# Patient Record
Sex: Female | Born: 1945 | Race: White | Hispanic: No | Smoking: Former smoker
Health system: Southern US, Community
[De-identification: ages and names within clinical notes are randomized; demographics above are authoritative.]

## PROBLEM LIST (undated history)

## (undated) DIAGNOSIS — E785 Hyperlipidemia, unspecified: Secondary | ICD-10-CM

## (undated) DIAGNOSIS — Z0000 Encounter for general adult medical examination without abnormal findings: Secondary | ICD-10-CM

## (undated) DIAGNOSIS — F4321 Adjustment disorder with depressed mood: Secondary | ICD-10-CM

## (undated) DIAGNOSIS — M1990 Unspecified osteoarthritis, unspecified site: Secondary | ICD-10-CM

## (undated) DIAGNOSIS — I10 Essential (primary) hypertension: Secondary | ICD-10-CM

## (undated) DIAGNOSIS — M7500 Adhesive capsulitis of unspecified shoulder: Secondary | ICD-10-CM

## (undated) DIAGNOSIS — M75101 Unspecified rotator cuff tear or rupture of right shoulder, not specified as traumatic: Secondary | ICD-10-CM

## (undated) DIAGNOSIS — J209 Acute bronchitis, unspecified: Secondary | ICD-10-CM

## (undated) DIAGNOSIS — L719 Rosacea, unspecified: Secondary | ICD-10-CM

## (undated) DIAGNOSIS — M8580 Other specified disorders of bone density and structure, unspecified site: Secondary | ICD-10-CM

## (undated) DIAGNOSIS — N6459 Other signs and symptoms in breast: Secondary | ICD-10-CM

## (undated) DIAGNOSIS — E7800 Pure hypercholesterolemia, unspecified: Secondary | ICD-10-CM

## (undated) HISTORY — PX: SHOULDER ARTHROSCOPY: SHX128

## (undated) HISTORY — DX: Pure hypercholesterolemia, unspecified: E78.00

## (undated) HISTORY — DX: Encounter for general adult medical examination without abnormal findings: Z00.00

## (undated) HISTORY — DX: Unspecified osteoarthritis, unspecified site: M19.90

## (undated) HISTORY — DX: Acute bronchitis, unspecified: J20.9

## (undated) HISTORY — DX: Essential (primary) hypertension: I10

## (undated) HISTORY — DX: Unspecified rotator cuff tear or rupture of right shoulder, not specified as traumatic: M75.101

## (undated) HISTORY — DX: Rosacea, unspecified: L71.9

## (undated) HISTORY — DX: Other specified disorders of bone density and structure, unspecified site: M85.80

## (undated) HISTORY — PX: OOPHORECTOMY: SHX86

## (undated) HISTORY — DX: Adhesive capsulitis of unspecified shoulder: M75.00

## (undated) HISTORY — DX: Hyperlipidemia, unspecified: E78.5

## (undated) HISTORY — DX: Adjustment disorder with depressed mood: F43.21

---

## 1997-09-10 ENCOUNTER — Other Ambulatory Visit: Admission: RE | Admit: 1997-09-10 | Discharge: 1997-09-10 | Payer: Self-pay | Admitting: Obstetrics and Gynecology

## 1997-10-15 ENCOUNTER — Ambulatory Visit (HOSPITAL_COMMUNITY): Admission: RE | Admit: 1997-10-15 | Discharge: 1997-10-15 | Payer: Self-pay | Admitting: Obstetrics and Gynecology

## 1998-07-06 ENCOUNTER — Other Ambulatory Visit: Admission: RE | Admit: 1998-07-06 | Discharge: 1998-07-06 | Payer: Self-pay | Admitting: Obstetrics and Gynecology

## 1998-11-08 ENCOUNTER — Other Ambulatory Visit: Admission: RE | Admit: 1998-11-08 | Discharge: 1998-11-08 | Payer: Self-pay | Admitting: Obstetrics and Gynecology

## 1999-05-09 ENCOUNTER — Other Ambulatory Visit: Admission: RE | Admit: 1999-05-09 | Discharge: 1999-05-09 | Payer: Self-pay | Admitting: Obstetrics and Gynecology

## 1999-11-10 ENCOUNTER — Other Ambulatory Visit: Admission: RE | Admit: 1999-11-10 | Discharge: 1999-11-10 | Payer: Self-pay | Admitting: Obstetrics and Gynecology

## 2000-11-01 ENCOUNTER — Other Ambulatory Visit: Admission: RE | Admit: 2000-11-01 | Discharge: 2000-11-01 | Payer: Self-pay | Admitting: Obstetrics and Gynecology

## 2002-02-03 ENCOUNTER — Other Ambulatory Visit: Admission: RE | Admit: 2002-02-03 | Discharge: 2002-02-03 | Payer: Self-pay | Admitting: Obstetrics and Gynecology

## 2003-03-13 ENCOUNTER — Other Ambulatory Visit: Admission: RE | Admit: 2003-03-13 | Discharge: 2003-03-13 | Payer: Self-pay | Admitting: Obstetrics and Gynecology

## 2003-04-23 ENCOUNTER — Encounter (INDEPENDENT_AMBULATORY_CARE_PROVIDER_SITE_OTHER): Payer: Self-pay | Admitting: Specialist

## 2003-04-23 ENCOUNTER — Ambulatory Visit (HOSPITAL_COMMUNITY): Admission: RE | Admit: 2003-04-23 | Discharge: 2003-04-23 | Payer: Self-pay | Admitting: *Deleted

## 2004-05-03 ENCOUNTER — Other Ambulatory Visit: Admission: RE | Admit: 2004-05-03 | Discharge: 2004-05-03 | Payer: Self-pay | Admitting: Obstetrics and Gynecology

## 2005-05-31 ENCOUNTER — Encounter: Payer: Self-pay | Admitting: Obstetrics and Gynecology

## 2005-06-28 ENCOUNTER — Other Ambulatory Visit: Admission: RE | Admit: 2005-06-28 | Discharge: 2005-06-28 | Payer: Self-pay | Admitting: Obstetrics & Gynecology

## 2006-07-17 ENCOUNTER — Other Ambulatory Visit: Admission: RE | Admit: 2006-07-17 | Discharge: 2006-07-17 | Payer: Self-pay | Admitting: Obstetrics and Gynecology

## 2007-08-27 ENCOUNTER — Other Ambulatory Visit: Admission: RE | Admit: 2007-08-27 | Discharge: 2007-08-27 | Payer: Self-pay | Admitting: Obstetrics and Gynecology

## 2008-05-02 ENCOUNTER — Ambulatory Visit: Payer: Self-pay | Admitting: Diagnostic Radiology

## 2008-05-02 ENCOUNTER — Emergency Department (HOSPITAL_BASED_OUTPATIENT_CLINIC_OR_DEPARTMENT_OTHER): Admission: EM | Admit: 2008-05-02 | Discharge: 2008-05-02 | Payer: Self-pay | Admitting: Emergency Medicine

## 2010-06-17 NOTE — Op Note (Signed)
Robin [name], MADOLE                            ACCOUNT NO.:  [id number]   MEDICAL RECORD NO.:  [id number]                   PATIENT TYPE:  AMB   LOCATION:  ENDO                                 FACILITY:  [facility]   PHYSICIAN:  Georgiana Spinner, M.D.                 DATE OF BIRTH:  [birth year]   DATE OF PROCEDURE:  04/23/2003  DATE OF DISCHARGE:                                 OPERATIVE REPORT   PROCEDURE:  Colonoscopy.   INDICATIONS FOR PROCEDURE:  Colon cancer screening.   ANESTHESIA:  Demerol 20, Versed 2 mg.   PROCEDURE:  With the patient mildly sedated in the left lateral decubitus  position, the Olympus videoscopic colonoscope was inserted in the rectum and  passed under direct vision to the cecum identified by the ileocecal valve  and appendiceal orifice, both of which were photographed.  From this point,  the colonoscope was slowly withdrawn taking circumferential views of the  entire colonic mucosa stopping only in the rectum which appeared normal on  direct and retroflex view.  The endoscope was straightened withdrawn.  The  patient's vital signs and pulse oximeter remained stable.  The patient  tolerated the procedure well without apparent complications.   FINDINGS:  Unremarkable examination.   PLAN:  See endoscopy note for further details.                                               Georgiana Spinner, M.D.    GMO/MEDQ  D:  04/23/2003  T:  04/23/2003  Job:  [id number]

## 2010-06-17 NOTE — Op Note (Signed)
Robin [name], Robin [name]                            ACCOUNT NO.:  [id number]   MEDICAL RECORD NO.:  [id number]                   PATIENT TYPE:  AMB   LOCATION:  ENDO                                 FACILITY:  [facility]   PHYSICIAN:  Georgiana Spinner, M.D.                 DATE OF BIRTH:  1945-11-15   DATE OF PROCEDURE:  04/23/2003  DATE OF DISCHARGE:                                 OPERATIVE REPORT   PROCEDURE:  Upper endoscopy.   INDICATIONS FOR PROCEDURE:  GERD.   ANESTHESIA:  Demerol 50, Versed 5 mg.   PROCEDURE:  With the patient mildly sedated in the left lateral decubitus  position, the Olympus videoscopic endoscope was inserted in the mouth and  passed under direct vision through the esophagus which appeared normal  except the distal esophagus showed possible Barrett's, although this may  have been a normal variant, we photographed and biopsied this area.  We  entered into the stomach.  The fundus, body, antrum, duodenal bulb, and  second portion of the duodenum were visualized.  From this point, the  endoscope was slowly withdrawn taking circumferential views of the duodenal  mucosa until the endoscope was pulled back into the stomach and placed in  retroflexion to view the stomach from below.  The endoscope was then  straightened and withdrawn taking circumferential views of the remaining  gastric and esophageal mucosa.  The patient's vital signs and pulse oximeter  remained stable.  The patient tolerated the procedure well without apparent  complications.   FINDINGS:  Unremarkable examination other than distal esophagus which may be  a normal variant.  Await biopsy results.  The patient will call me for  results and follow up with me as an outpatient.  Proceed to colonoscopy.                                               Georgiana Spinner, M.D.    GMO/MEDQ  D:  04/23/2003  T:  04/23/2003  Job:  [id number]

## 2011-10-31 DIAGNOSIS — M7500 Adhesive capsulitis of unspecified shoulder: Secondary | ICD-10-CM

## 2011-10-31 HISTORY — DX: Adhesive capsulitis of unspecified shoulder: M75.00

## 2011-11-24 ENCOUNTER — Ambulatory Visit: Payer: Medicare Other | Attending: Orthopaedic Surgery | Admitting: Rehabilitation

## 2011-11-24 DIAGNOSIS — M25619 Stiffness of unspecified shoulder, not elsewhere classified: Secondary | ICD-10-CM | POA: Insufficient documentation

## 2011-11-24 DIAGNOSIS — M25519 Pain in unspecified shoulder: Secondary | ICD-10-CM | POA: Insufficient documentation

## 2011-11-24 DIAGNOSIS — IMO0001 Reserved for inherently not codable concepts without codable children: Secondary | ICD-10-CM | POA: Insufficient documentation

## 2011-11-27 ENCOUNTER — Ambulatory Visit: Payer: Medicare Other | Admitting: Rehabilitation

## 2011-11-28 ENCOUNTER — Ambulatory Visit: Payer: Medicare Other | Admitting: Rehabilitation

## 2011-11-29 ENCOUNTER — Ambulatory Visit: Payer: Medicare Other | Admitting: Rehabilitation

## 2011-11-30 ENCOUNTER — Ambulatory Visit: Payer: Medicare Other | Admitting: Rehabilitation

## 2011-12-01 ENCOUNTER — Ambulatory Visit: Payer: Medicare Other | Admitting: Rehabilitation

## 2012-11-26 ENCOUNTER — Ambulatory Visit (INDEPENDENT_AMBULATORY_CARE_PROVIDER_SITE_OTHER): Payer: Medicare Other | Admitting: Internal Medicine

## 2012-11-26 ENCOUNTER — Encounter: Payer: Self-pay | Admitting: Internal Medicine

## 2012-11-26 VITALS — BP 124/78 | HR 82 | Temp 98.5°F | Resp 16 | Ht 59.0 in | Wt 129.0 lb

## 2012-11-26 DIAGNOSIS — Z800 Family history of malignant neoplasm of digestive organs: Secondary | ICD-10-CM

## 2012-11-26 DIAGNOSIS — I10 Essential (primary) hypertension: Secondary | ICD-10-CM

## 2012-11-26 DIAGNOSIS — M810 Age-related osteoporosis without current pathological fracture: Secondary | ICD-10-CM | POA: Insufficient documentation

## 2012-11-26 DIAGNOSIS — Z860100 Personal history of colon polyps, unspecified: Secondary | ICD-10-CM | POA: Insufficient documentation

## 2012-11-26 DIAGNOSIS — M8580 Other specified disorders of bone density and structure, unspecified site: Secondary | ICD-10-CM

## 2012-11-26 DIAGNOSIS — F17200 Nicotine dependence, unspecified, uncomplicated: Secondary | ICD-10-CM

## 2012-11-26 DIAGNOSIS — M1990 Unspecified osteoarthritis, unspecified site: Secondary | ICD-10-CM | POA: Insufficient documentation

## 2012-11-26 DIAGNOSIS — N809 Endometriosis, unspecified: Secondary | ICD-10-CM | POA: Insufficient documentation

## 2012-11-26 DIAGNOSIS — E785 Hyperlipidemia, unspecified: Secondary | ICD-10-CM

## 2012-11-26 DIAGNOSIS — Z23 Encounter for immunization: Secondary | ICD-10-CM

## 2012-11-26 DIAGNOSIS — M7500 Adhesive capsulitis of unspecified shoulder: Secondary | ICD-10-CM

## 2012-11-26 DIAGNOSIS — M899 Disorder of bone, unspecified: Secondary | ICD-10-CM

## 2012-11-26 DIAGNOSIS — Z720 Tobacco use: Secondary | ICD-10-CM

## 2012-11-26 DIAGNOSIS — M7502 Adhesive capsulitis of left shoulder: Secondary | ICD-10-CM

## 2012-11-26 DIAGNOSIS — M75101 Unspecified rotator cuff tear or rupture of right shoulder, not specified as traumatic: Secondary | ICD-10-CM | POA: Insufficient documentation

## 2012-11-26 DIAGNOSIS — K219 Gastro-esophageal reflux disease without esophagitis: Secondary | ICD-10-CM | POA: Insufficient documentation

## 2012-11-26 DIAGNOSIS — Z87891 Personal history of nicotine dependence: Secondary | ICD-10-CM | POA: Insufficient documentation

## 2012-11-26 DIAGNOSIS — J309 Allergic rhinitis, unspecified: Secondary | ICD-10-CM

## 2012-11-26 HISTORY — DX: Unspecified rotator cuff tear or rupture of right shoulder, not specified as traumatic: M75.101

## 2012-11-26 LAB — COMPREHENSIVE METABOLIC PANEL
ALT: 14 U/L (ref 0–35)
AST: 18 U/L (ref 0–37)
Alkaline Phosphatase: 61 U/L (ref 39–117)
CO2: 27 mEq/L (ref 19–32)
Calcium: 10.3 mg/dL (ref 8.4–10.5)
Chloride: 105 mEq/L (ref 96–112)
Creat: 0.77 mg/dL (ref 0.50–1.10)
Glucose, Bld: 92 mg/dL (ref 70–99)
Total Protein: 6.8 g/dL (ref 6.0–8.3)

## 2012-11-26 LAB — CBC WITH DIFFERENTIAL/PLATELET
Basophils Relative: 0 % (ref 0–1)
Eosinophils Absolute: 0.2 10*3/uL (ref 0.0–0.7)
Eosinophils Relative: 1 % (ref 0–5)
HCT: 39.2 % (ref 36.0–46.0)
Hemoglobin: 13.5 g/dL (ref 12.0–15.0)
Lymphocytes Relative: 23 % (ref 12–46)
Lymphs Abs: 3 10*3/uL (ref 0.7–4.0)
MCH: 31.5 pg (ref 26.0–34.0)
MCV: 91.4 fL (ref 78.0–100.0)
Monocytes Relative: 8 % (ref 3–12)
Neutro Abs: 8.8 10*3/uL — ABNORMAL HIGH (ref 1.7–7.7)
Platelets: 418 10*3/uL — ABNORMAL HIGH (ref 150–400)
RBC: 4.29 MIL/uL (ref 3.87–5.11)
RDW: 13.7 % (ref 11.5–15.5)
WBC: 13.1 10*3/uL — ABNORMAL HIGH (ref 4.0–10.5)

## 2012-11-26 LAB — LIPID PANEL
Cholesterol: 212 mg/dL — ABNORMAL HIGH (ref 0–200)
HDL: 68 mg/dL (ref >39)
LDL Cholesterol: 99 mg/dL (ref 0–99)
Total CHOL/HDL Ratio: 3.1 Ratio
Triglycerides: 225 mg/dL — ABNORMAL HIGH (ref <150)
VLDL: 45 mg/dL — ABNORMAL HIGH (ref 0–40)

## 2012-11-26 NOTE — Patient Instructions (Signed)
See me in 2-3 weeks  30 mins    Labs today  Flu vaccine given today

## 2012-11-26 NOTE — Progress Notes (Signed)
  Subjective:    Patient ID: Robin [name], female    DOB: 10/10/1945, 67 y.o.   MRN: [id number]  HPI Robin [name] is here new pt for first visit  To establish care  Former primary  [facility]  [place]  [facility].  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  Family history of colon ca in father  She is concerned over lab results  She has been told her WBC is elevated on more than one occasion and has been told she needs to see a hematologist. 08/16/2012  WBC  13.5 no differential  This is the only cbc I have a copy of   Allergies  Allergen Reactions  . Codeine Other (See Comments)   Past Medical History  Diagnosis Date  . Hypertension   . Hyperlipidemia   . Osteopenia   . Arthritis    Past Surgical History  Procedure Laterality Date  . Shoulder arthroscopy    . Oophorectomy Right    History   Social History  . Marital Status: Married    Spouse Name: N/A    Number of Children: N/A  . Years of Education: N/A   Occupational History  . Not on file.   Social History Main Topics  . Smoking status: Current Every Day Smoker -- 0.50 packs/day    Types: Cigarettes  . Smokeless tobacco: Never Used  . Alcohol Use: 1.8 oz/week    3 Glasses of wine per week     Comment: 3 glasses daily  . Drug Use: No  . Sexual Activity: No   Other Topics Concern  . Not on file   Social History Narrative  . No narrative on file   Family History  Problem Relation Age of Onset  . Cancer Father 37    colon   . Arthritis Father   . Stroke Maternal Grandmother   . Heart disease Paternal Grandfather    Patient Active Problem List   Diagnosis Date Noted  . HTN (hypertension) 11/26/2012  . Hyperlipidemia 11/26/2012  . DJD (degenerative joint disease) 11/26/2012  . Tobacco use 11/26/2012   No current outpatient prescriptions on file prior to visit.    No current facility-administered medications on file prior to visit.       Review of Systems See HPI    Objective:   Physical Exam Physical Exam  Nursing note and vitals reviewed.  Constitutional: She is oriented to person, place, and time. She appears well-developed and well-nourished.  HENT:  Head: Normocephalic and atraumatic.  Cardiovascular: Normal rate and regular rhythm. Exam reveals no gallop and no friction rub.  No murmur heard.  Pulmonary/Chest: Breath sounds normal. She has no wheezes. She has no rales.  Neurological: She is alert and oriented to person, place, and time.  Skin: Skin is warm and dry.  Psychiatric: She has a normal mood and affect. Her behavior is normal.             Assessment & Plan:  Elevated WBC by history  Will check today    Hyperlipidemia    Continue pravachol  HTN  Continue meds  Allergic rhinitis  FH of colon cancer in father  Will see mle in 2-3 weeks to discuss WBC

## 2012-11-27 ENCOUNTER — Telehealth: Payer: Self-pay | Admitting: Internal Medicine

## 2012-11-27 NOTE — Telephone Encounter (Signed)
Left message on mobile to call office regarding lab results 

## 2012-11-28 ENCOUNTER — Telehealth: Payer: Self-pay | Admitting: Internal Medicine

## 2012-11-28 ENCOUNTER — Encounter: Payer: Self-pay | Admitting: *Deleted

## 2012-11-28 NOTE — Telephone Encounter (Signed)
Spoke with pt and informed of labs results  Will refer for hematology opinion

## 2012-12-03 ENCOUNTER — Other Ambulatory Visit: Payer: Self-pay | Admitting: Internal Medicine

## 2012-12-03 DIAGNOSIS — D72829 Elevated white blood cell count, unspecified: Secondary | ICD-10-CM

## 2012-12-11 ENCOUNTER — Encounter: Payer: Self-pay | Admitting: Internal Medicine

## 2012-12-11 ENCOUNTER — Ambulatory Visit (INDEPENDENT_AMBULATORY_CARE_PROVIDER_SITE_OTHER): Payer: Medicare Other | Admitting: Internal Medicine

## 2012-12-11 VITALS — BP 134/74 | HR 74 | Temp 98.2°F | Resp 18 | Wt 128.0 lb

## 2012-12-11 DIAGNOSIS — D72829 Elevated white blood cell count, unspecified: Secondary | ICD-10-CM

## 2012-12-11 DIAGNOSIS — I10 Essential (primary) hypertension: Secondary | ICD-10-CM

## 2012-12-11 DIAGNOSIS — E785 Hyperlipidemia, unspecified: Secondary | ICD-10-CM

## 2012-12-11 DIAGNOSIS — J329 Chronic sinusitis, unspecified: Secondary | ICD-10-CM

## 2012-12-11 MED ORDER — AZITHROMYCIN 250 MG PO TABS: ORAL_TABLET | ORAL | Status: DC

## 2012-12-11 NOTE — Progress Notes (Signed)
Subjective:    Patient ID: Robin [name], female    DOB: August 07, 1945, 67 y.o.   MRN: [id number]  HPI  Robin [name] is here for acute visit.  10 days of sinus pain  And sore throat no fever no cough.    She has upcoming appt for minimal elevated WBC with Dr. Myna Hidalgo.  Pt tells me she has run around  11K in the past - I do not have oldr records as yet  Allergies  Allergen Reactions  . Codeine Other (See Comments)   Past Medical History  Diagnosis Date  . Hypertension   . Hyperlipidemia   . Osteopenia   . Arthritis    Past Surgical History  Procedure Laterality Date  . Shoulder arthroscopy    . Oophorectomy Right    History   Social History  . Marital Status: Married    Spouse Name: N/A    Number of Children: N/A  . Years of Education: N/A   Occupational History  . Not on file.   Social History Main Topics  . Smoking status: Current Every Day Smoker -- 0.50 packs/day    Types: Cigarettes  . Smokeless tobacco: Never Used  . Alcohol Use: 1.8 oz/week    3 Glasses of wine per week     Comment: 3 glasses daily  . Drug Use: No  . Sexual Activity: No   Other Topics Concern  . Not on file   Social History Narrative  . No narrative on file   Family History  Problem Relation Age of Onset  . Cancer Father 83    colon   . Arthritis Father   . Stroke Maternal Grandmother   . Heart disease Paternal Grandfather    Patient Active Problem List   Diagnosis Date Noted  . Elevated WBC count 12/11/2012  . HTN (hypertension) 11/26/2012  . Hyperlipidemia 11/26/2012  . DJD (degenerative joint disease) 11/26/2012  . Tobacco use 11/26/2012  . GERD (gastroesophageal reflux disease) 11/26/2012  . Osteopenia 11/26/2012  . Family history of colon cancer 11/26/2012  . Personal history of colonic polyps 11/26/2012  . Allergic rhinitis 11/26/2012  . Endometriosis 11/26/2012  . Adhesive capsulitis 11/26/2012   Current Outpatient Prescriptions on File Prior to Visit  Medication Sig  Dispense Refill  . ALPRAZolam (XANAX) 0.5 MG tablet Take 0.5 mg by mouth as needed for sleep.      . calcium citrate-vitamin D (CITRACAL+D) 315-200 MG-UNIT per tablet Take 1 tablet by mouth 2 (two) times daily.      . cholecalciferol (VITAMIN D) 1000 UNITS tablet Take 1,000 Units by mouth daily.      Marland Kitchen esomeprazole (NEXIUM) 40 MG capsule Take 40 mg by mouth as needed.      . fish oil-omega-3 fatty acids 1000 MG capsule Take 2 g by mouth daily.      Marland Kitchen loratadine (CLARITIN) 10 MG tablet Take 10 mg by mouth daily.      . Multiple Vitamins-Minerals (MULTIVITAMIN WITH MINERALS) tablet Take 1 tablet by mouth daily.      . pravastatin (PRAVACHOL) 80 MG tablet Take 80 mg by mouth daily.      . vitamin E (VITAMIN E) 400 UNIT capsule Take 400 Units by mouth daily.      Marland Kitchen losartan (COZAAR) 50 MG tablet Take 50 mg by mouth daily.       No current facility-administered medications on file prior to visit.     Review of Systems See HPI    Objective:  Physical Exam  Physical Exam  Constitutional: She is oriented to person, place, and time. She appears well-developed and well-nourished. She is cooperative.  HENT:  Head: Normocephalic and atraumatic.  Right Ear: A middle ear effusion is present.  Left Ear: A middle ear effusion is present.  Nose: Mucosal edema present. Right sinus exhibits maxillary sinus tenderness. Left sinus exhibits maxillary sinus tenderness.  Mouth/Throat: Posterior oropharyngeal erythema present.  Serous effusion bilaterally  Eyes: Conjunctivae and EOM are normal. Pupils are equal, round, and reactive to light.  Neck: Neck supple. Carotid bruit is not present. No mass present.  Cardiovascular: Regular rhythm, normal heart sounds, intact distal pulses and normal pulses. Exam reveals no gallop and no friction rub.  No murmur heard.  Pulmonary/Chest: Breath sounds normal. She has no wheezes. She has no rhonchi. She has no rales.  Neurological: She is alert and oriented to  person, place, and time.  Skin: Skin is warm and dry. No abrasion, no bruising, no ecchymosis and no rash noted. No cyanosis. Nails show no clubbing.  Psychiatric: She has a normal mood and affect. Her speech is normal and behavior is normal.            Assessment & Plan:  Sinusitis:  willl give Z-pak  She cannot tolerate codeiene  Minimally elevated WBC  Has upcoming appt.    Hyperlipidemia:  Acceptable levels.  Schedule CPE for 2015

## 2012-12-11 NOTE — Patient Instructions (Signed)
Schedule CPE for 2015    Go to pharmacy  For meds  Call if not better

## 2012-12-31 ENCOUNTER — Telehealth: Payer: Self-pay | Admitting: Hematology & Oncology

## 2012-12-31 NOTE — Telephone Encounter (Signed)
I spoke w NEW PATIENT today to remind them of their appointment with Dr. Ennever. Also, advised them to bring all meds and insurance information. ° °

## 2013-01-01 ENCOUNTER — Ambulatory Visit (HOSPITAL_BASED_OUTPATIENT_CLINIC_OR_DEPARTMENT_OTHER): Payer: Medicare Other | Admitting: Hematology & Oncology

## 2013-01-01 ENCOUNTER — Ambulatory Visit: Payer: Medicare Other

## 2013-01-01 ENCOUNTER — Other Ambulatory Visit (HOSPITAL_BASED_OUTPATIENT_CLINIC_OR_DEPARTMENT_OTHER): Payer: Medicare Other | Admitting: Lab

## 2013-01-01 VITALS — BP 140/59 | HR 71 | Temp 98.0°F | Resp 14 | Ht 59.0 in | Wt 131.0 lb

## 2013-01-01 DIAGNOSIS — F17200 Nicotine dependence, unspecified, uncomplicated: Secondary | ICD-10-CM

## 2013-01-01 DIAGNOSIS — D45 Polycythemia vera: Secondary | ICD-10-CM

## 2013-01-01 DIAGNOSIS — R05 Cough: Secondary | ICD-10-CM

## 2013-01-01 LAB — CBC WITH DIFFERENTIAL (CANCER CENTER ONLY)
BASO%: 0.5 % (ref 0.0–2.0)
Eosinophils Absolute: 0.2 10*3/uL (ref 0.0–0.5)
HCT: 37 % (ref 34.8–46.6)
HGB: 12.3 g/dL (ref 11.6–15.9)
LYMPH#: 4.2 10*3/uL — ABNORMAL HIGH (ref 0.9–3.3)
LYMPH%: 31.3 % (ref 14.0–48.0)
MCV: 95 fL (ref 81–101)
MONO#: 1.3 10*3/uL — ABNORMAL HIGH (ref 0.1–0.9)
NEUT%: 57.4 % (ref 39.6–80.0)
Platelets: 357 10*3/uL (ref 145–400)
RBC: 3.88 10*6/uL (ref 3.70–5.32)
RDW: 13 % (ref 11.1–15.7)
WBC: 13.3 10*3/uL — ABNORMAL HIGH (ref 3.9–10.0)

## 2013-01-01 LAB — CHCC SATELLITE - SMEAR

## 2013-01-01 NOTE — Progress Notes (Signed)
This office note has been dictated.

## 2013-01-02 LAB — FERRITIN CHCC: Ferritin: 108 ng/ml (ref 9–269)

## 2013-01-02 LAB — ERYTHROPOIETIN: Erythropoietin: 5.4 m[IU]/mL (ref 2.6–18.5)

## 2013-01-02 LAB — LACTATE DEHYDROGENASE: LDH: 121 U/L (ref 94–250)

## 2013-01-05 NOTE — Progress Notes (Signed)
CC:   Robin [name], M.D.  DIAGNOSIS:  Leukocytosis.  HISTORY OF PRESENT ILLNESS:  Robin [name] is a very charming 67 year old white female.  She is followed by Dr. Constance Goltz.  She has been pretty healthy.  She does have hypertension and hyperlipidemia.  She has arthritis, which she says runs in her family.  She has had a hysterectomy for endometriosis.  She was found to have an elevated white cell count by Dr. Constance Goltz.  Going back through the records, back in October, her white cell count was 13.1, hemoglobin 13.5, platelet count 418.  She had a normal white cell differential.  Robin [name] has not had any problem with infections on a recurrent basis. She did have, I think, a sinus infection a month or so ago.  She has routine checkups.  She gets mammograms yearly.  Her last colonoscopy was, I think,  3 years ago.  She thinks her last chest x-ray was a couple of years ago.  The only chest x-ray that I have in the system was back in 2010, which was unremarkable.  She does smoke.  She smoked on and off for probably 40 years or so.  She is trying to cut back.  She has had no rashes.  There has been no swollen lymph nodes.  She has had no change in bowel or bladder habits.  She is kindly referred to [facility] for an evaluation of the leukocytosis.  PAST MEDICAL HISTORY:  Remarkable for: 1. Hypertension. 2. Hyperlipidemia. 3. Arthritis. 4. Adhesive capsulitis of the left shoulder. 5. Endometriosis. 6. Hysterectomy.  ALLERGIES:  Codeine.  MEDICATIONS:  Nexium 40 mg p.o. daily, Claritin 10 mg p.o. daily, Cozaar 50 mg p.o. daily, Pravachol 80 mg p.o. daily, Valtrex 500 mg p.o. daily as needed, Xanax 0.5 mg p.o. at bedtime p.r.n.  SOCIAL HISTORY:  Remarkable for tobacco use.  She probably has about a 20-pack-year history of tobacco use.  She is trying to cut back.  She has social alcohol use.  She has no obvious occupational exposures.  FAMILY  HISTORY:  Remarkable for colon cancer in her father at age 74. There is also history of arthritis, cerebrovascular disease, and coronary artery disease.  REVIEW OF SYSTEMS:  As stated in history of present illness.  No additional findings are noted on a 12-system review.  PHYSICAL EXAMINATION:  General:  This is a well-developed, well- nourished white female, in no obvious distress.  Vital signs:  Show temperature of 98, pulse 71, respiratory rate 14, blood pressure 140/59. Weight is 131 pounds.  Head/Neck Exam:  Shows normocephalic, atraumatic skull.  There are no ocular or oral lesions.  There are no palpable cervical or supraclavicular lymph nodes.  Lungs:  Clear bilaterally. Cardiac Exam:  Regular rate and rhythm with normal S1 and S2.  She has no murmurs, rubs or bruits.  Abdomen:  Soft.  She has good bowel sounds. There is no fluid wave.  There is no palpable abdominal mass.  There is no palpable hepatosplenomegaly.  Back Exam:  No tenderness over the spine, ribs, or hips.  Extremities:  Show no clubbing, cyanosis, or edema.  She has good range of motion of her joints.  She has some osteoarthritic changes in her joints.  She has good muscle strength in upper and lower extremities.  Skin:  Shows no rashes, ecchymoses, or petechia.  Neurological Exam:  Shows no focal neurological deficits.  LABORATORY STUDIES:  White cell count is 13.3, hemoglobin 12.3,  hematocrit 37, platelet count 357.  MCV is 95.  White cell differential shows 57 segs, 31 lymphs, 10 monos, 1 eosinophil.  Peripheral smear shows a normochromic, normocytic population of red blood cells.  There are no nucleated red blood cells.  There are no teardrop cells.  There are no target cells.  I see no rouleaux formation.  White cells are minimally increased in number.  She has good maturation of her white blood cells.  I see no hypersegmented polys. There are no immature myeloid or lymphoid forms.  I see no  atypical lymphocytes.  Platelets are adequate in number and size.  IMPRESSION:  Robin [name] is a very charming 67 year old white female. She has mild leukocytosis.  Her blood smear is pretty much unremarkable.  Her physical exam, thankfully, also is pretty much unremarkable.  I do not detect any hepatosplenomegaly.  There is no lymphadenopathy.  I did not see any kind of rashes.  I really believe that the leukocytosis is more reactive than anything else.  I think that with her smoking, this might be the "trigger."  I do not see anything that would suggest an underlying malignancy. However, she probably does need to have a chest x-ray done.  We will see about getting this done when we get her back in the office.  I had a long talk with Robin [name].  I reviewed her lab work with her. She understands very well what is going on.  I encouraged her to stop smoking.  I have also encouraged her to take 1 baby aspirin a day.  I think this is very important for her.  Again, I do not see the need for a bone marrow test.  I think this would be very low yield.  I am getting a JAK2 assay on her just to make sure that we are not overlooking a bone marrow neoplasm.  If her JAK2 is positive, then we will definitely get a bone marrow test done on her.  For now, I will plan to get her back in about 4 months.  I think that this would be appropriate for [place].  She feels well.  She is exercising.  She has an excellent performance status, so this leukocytosis really has not been a problem for her.    ______________________________ Josph Macho, M.D. PRE/MEDQ  D:  01/01/2013  T:  01/04/2013  Job:  [id number]

## 2013-01-06 ENCOUNTER — Ambulatory Visit: Payer: Medicare Other | Admitting: *Deleted

## 2013-01-06 LAB — HM MAMMOGRAPHY

## 2013-01-06 MED ORDER — PNEUMOCOCCAL VAC POLYVALENT 25 MCG/0.5ML IJ INJ: 0.5000 mL | INJECTION | INTRAMUSCULAR | Status: DC

## 2013-01-06 NOTE — Progress Notes (Signed)
Patient ID: Robin [name], female   DOB: 1945-07-18, 67 y.o.   MRN: [id number] Robin [name] is here for a PNA vaccination. She is feeling well with no fevers VIS given to pt.

## 2013-01-07 ENCOUNTER — Telehealth: Payer: Self-pay | Admitting: Nurse Practitioner

## 2013-01-07 NOTE — Telephone Encounter (Addendum)
Message copied by Glee Arvin on Tue Jan 07, 2013  3:21 PM ------      Message from: Arlan Organ R      Created: Mon Jan 06, 2013  9:50 PM       Call - labs are all ok!!!  Genetic test is normal!!  I doubt you have a bone marrow problem!! Pete ------Pt verbalized understanding and appreciation.

## 2013-02-06 ENCOUNTER — Encounter: Payer: Self-pay | Admitting: *Deleted

## 2013-04-10 ENCOUNTER — Ambulatory Visit: Payer: Self-pay | Admitting: Nurse Practitioner

## 2013-04-14 ENCOUNTER — Encounter: Payer: Self-pay | Admitting: Internal Medicine

## 2013-04-14 ENCOUNTER — Ambulatory Visit (HOSPITAL_BASED_OUTPATIENT_CLINIC_OR_DEPARTMENT_OTHER)
Admission: RE | Admit: 2013-04-14 | Discharge: 2013-04-14 | Disposition: A | Discharge status: Home / Self Care | Payer: Medicare Other | Source: Ambulatory Visit | Attending: Internal Medicine | Admitting: Internal Medicine

## 2013-04-14 ENCOUNTER — Ambulatory Visit (INDEPENDENT_AMBULATORY_CARE_PROVIDER_SITE_OTHER): Payer: Medicare Other | Admitting: Internal Medicine

## 2013-04-14 VITALS — BP 120/73 | HR 86 | Temp 98.4°F | Resp 18 | Wt 134.0 lb

## 2013-04-14 DIAGNOSIS — I10 Essential (primary) hypertension: Secondary | ICD-10-CM

## 2013-04-14 DIAGNOSIS — Z860100 Personal history of colon polyps, unspecified: Secondary | ICD-10-CM

## 2013-04-14 DIAGNOSIS — Z0000 Encounter for general adult medical examination without abnormal findings: Secondary | ICD-10-CM

## 2013-04-14 DIAGNOSIS — D72829 Elevated white blood cell count, unspecified: Secondary | ICD-10-CM | POA: Insufficient documentation

## 2013-04-14 DIAGNOSIS — Z87891 Personal history of nicotine dependence: Secondary | ICD-10-CM | POA: Insufficient documentation

## 2013-04-14 DIAGNOSIS — M949 Disorder of cartilage, unspecified: Secondary | ICD-10-CM

## 2013-04-14 DIAGNOSIS — E785 Hyperlipidemia, unspecified: Secondary | ICD-10-CM

## 2013-04-14 DIAGNOSIS — Z139 Encounter for screening, unspecified: Secondary | ICD-10-CM

## 2013-04-14 DIAGNOSIS — M899 Disorder of bone, unspecified: Secondary | ICD-10-CM

## 2013-04-14 DIAGNOSIS — M8580 Other specified disorders of bone density and structure, unspecified site: Secondary | ICD-10-CM

## 2013-04-14 LAB — POCT URINALYSIS DIPSTICK
Bilirubin, UA: NEGATIVE
Blood, UA: NEGATIVE
GLUCOSE UA: NEGATIVE
KETONES UA: NEGATIVE
LEUKOCYTES UA: NEGATIVE
Nitrite, UA: NEGATIVE
Protein, UA: NEGATIVE
Spec Grav, UA: 1.01
Urobilinogen, UA: NEGATIVE
pH, UA: 7

## 2013-04-14 NOTE — Progress Notes (Addendum)
Subjective:    Patient ID: Robin [name], female    DOB: 1946/01/28, 68 y.o.   MRN: [id number]  HPI  Meara is here for CPE.  She is enjoying her garden work this past weekend  HM:  She is S/P single oopherectomy  For endometriosis.  MM UTD  She is UTD with all vaccines and colonoscopy  - father had colon CA.   She states all paps for last 30 years have been normal.  Last pap  [address]  She has seen Dr. Marin Olp for minimally elevated WBC which was felt to be due to reactive leukocytosis. JAK2 mutation is negative  She continues to smoke 1-2 cigarettes per day.  Total pack-years about 25. She does not wish to stop.  Discussed new screeing guidelines with pt.  She declines CT screening  She is taking her Calcium and vitamin D    Review of Systems  Respiratory: Negative for chest tightness and shortness of breath.   Cardiovascular: Negative for chest pain, palpitations and leg swelling.  All other systems reviewed and are negative.       Objective:   Physical Exam Physical Exam  Nursing note and vitals reviewed.  Constitutional: She is oriented to person, place, and time. She appears well-developed and well-nourished.  HENT:  Head: Normocephalic and atraumatic.  Right Ear: Tympanic membrane and ear canal normal. No drainage. Tympanic membrane is not injected and not erythematous.  Left Ear: Tympanic membrane and ear canal normal. No drainage. Tympanic membrane is not injected and not erythematous.  Nose: Nose normal. Right sinus exhibits no maxillary sinus tenderness and no frontal sinus tenderness. Left sinus exhibits no maxillary sinus tenderness and no frontal sinus tenderness.  Mouth/Throat: Oropharynx is clear and moist. No oral lesions. No oropharyngeal exudate.  Eyes: Conjunctivae and EOM are normal. Pupils are equal, round, and reactive to light.  Neck: Normal range of motion. Neck supple. No JVD present. Carotid bruit is not present. No mass and no thyromegaly present.    Cardiovascular: Normal rate, regular rhythm, S1 normal, S2 normal and intact distal pulses. Exam reveals no gallop and no friction rub.  No murmur heard.  Pulses:  Carotid pulses are 2+ on the right side, and 2+ on the left side.  Dorsalis pedis pulses are 2+ on the right side, and 2+ on the left side.  No carotid bruit. No LE edema  Pulmonary/Chest: Breath sounds normal. She has no wheezes. She has no rales. She exhibits no tenderness.  Abdominal: Soft. Bowel sounds are normal. She exhibits no distension and no mass. There is no hepatosplenomegaly. There is no tenderness. There is no CVA tenderness.  Musculoskeletal: Normal range of motion.  No active synovitis to joints.  Lymphadenopathy:  She has no cervical adenopathy.  She has no axillary adenopathy.  Right: No inguinal and no supraclavicular adenopathy present.  Left: No inguinal and no supraclavicular adenopathy present.  Neurological: She is alert and oriented to person, place, and time. She has normal strength and normal reflexes. She displays no tremor. No cranial nerve deficit or sensory deficit. Coordination and gait normal.  Skin: Skin is warm and dry. No rash noted. No cyanosis. Nails show no clubbing.  Psychiatric: She has a normal mood and affect. Her speech is normal and behavior is normal. Cognition and memory are normal.           Assessment & Plan:  Health Maintenance:  UTD with colonoscopy , mm vaccines.  Counseled regarding screening chest CT  She does not wish at this time. Will get last pap result [address]  Tobacco use  Advised cessation  She declines intervention to stop at this time  HTN  Continue meds  Cozaar.  EKG  LAFB  Hyperllipidemia will check today continue Pravachol  Minimal leukocytosis  Likely reactive  Will get CXR today  Colon polyps/FH colon ca in father  Next colonoscopy  In 3-5 years  GERD

## 2013-04-14 NOTE — Patient Instructions (Signed)
Stop by xray for CXR    To have labs done today

## 2013-04-15 ENCOUNTER — Encounter: Payer: Self-pay | Admitting: *Deleted

## 2013-04-30 ENCOUNTER — Ambulatory Visit: Payer: Medicare Other | Admitting: Hematology & Oncology

## 2013-04-30 ENCOUNTER — Telehealth: Payer: Self-pay | Admitting: Hematology & Oncology

## 2013-04-30 ENCOUNTER — Ambulatory Visit (HOSPITAL_BASED_OUTPATIENT_CLINIC_OR_DEPARTMENT_OTHER)
Admission: RE | Admit: 2013-04-30 | Discharge: 2013-04-30 | Disposition: A | Discharge status: Home / Self Care | Payer: Medicare Other | Source: Ambulatory Visit | Attending: Hematology & Oncology | Admitting: Hematology & Oncology

## 2013-04-30 ENCOUNTER — Other Ambulatory Visit: Payer: Medicare Other | Admitting: Lab

## 2013-04-30 DIAGNOSIS — R05 Cough: Secondary | ICD-10-CM

## 2013-04-30 DIAGNOSIS — R059 Cough, unspecified: Secondary | ICD-10-CM

## 2013-04-30 NOTE — Telephone Encounter (Signed)
Radiology called pt was no show for chest x-ray and for Dr. Marin Olp. Pt stated she forgot she has plummer at her house. She rescheduled for 5-4. I left message for RN to tiage chest x ray if she still needs it. She got one on 3-16.

## 2013-06-02 ENCOUNTER — Other Ambulatory Visit (HOSPITAL_BASED_OUTPATIENT_CLINIC_OR_DEPARTMENT_OTHER): Payer: Medicare Other | Admitting: Lab

## 2013-06-02 ENCOUNTER — Ambulatory Visit (HOSPITAL_BASED_OUTPATIENT_CLINIC_OR_DEPARTMENT_OTHER): Payer: Medicare Other | Admitting: Hematology & Oncology

## 2013-06-02 ENCOUNTER — Encounter: Payer: Self-pay | Admitting: Hematology & Oncology

## 2013-06-02 VITALS — BP 147/70 | HR 88 | Temp 98.3°F | Resp 14 | Ht 64.0 in | Wt 133.0 lb

## 2013-06-02 DIAGNOSIS — D45 Polycythemia vera: Secondary | ICD-10-CM

## 2013-06-02 DIAGNOSIS — D72829 Elevated white blood cell count, unspecified: Secondary | ICD-10-CM

## 2013-06-02 LAB — CBC WITH DIFFERENTIAL (CANCER CENTER ONLY)
BASO#: 0.1 10*3/uL (ref 0.0–0.2)
BASO%: 0.4 % (ref 0.0–2.0)
EOS%: 1.3 % (ref 0.0–7.0)
Eosinophils Absolute: 0.2 10*3/uL (ref 0.0–0.5)
HCT: 38.1 % (ref 34.8–46.6)
HEMOGLOBIN: 12.7 g/dL (ref 11.6–15.9)
LYMPH#: 4.2 10*3/uL — AB (ref 0.9–3.3)
LYMPH%: 26.9 % (ref 14.0–48.0)
MCH: 31.9 pg (ref 26.0–34.0)
MCHC: 33.3 g/dL (ref 32.0–36.0)
MCV: 96 fL (ref 81–101)
MONO#: 1.4 10*3/uL — ABNORMAL HIGH (ref 0.1–0.9)
MONO%: 8.8 % (ref 0.0–13.0)
NEUT%: 62.6 % (ref 39.6–80.0)
NEUTROS ABS: 9.8 10*3/uL — AB (ref 1.5–6.5)
Platelets: 350 10*3/uL (ref 145–400)
RBC: 3.98 10*6/uL (ref 3.70–5.32)
RDW: 13.6 % (ref 11.1–15.7)
WBC: 15.6 10*3/uL — ABNORMAL HIGH (ref 3.9–10.0)

## 2013-06-02 LAB — CHCC SATELLITE - SMEAR

## 2013-06-02 NOTE — Patient Instructions (Signed)
You Can Quit Smoking If you are ready to quit smoking or are thinking about it, congratulations! You have chosen to help yourself be healthier and live longer! There are lots of different ways to quit smoking. Nicotine gum, nicotine patches, a nicotine inhaler, or nicotine nasal spray can help with physical craving. Hypnosis, support groups, and medicines help break the habit of smoking. TIPS TO GET OFF AND STAY OFF CIGARETTES  Learn to predict your moods. Do not let a bad situation be your excuse to have a cigarette. Some situations in your life might tempt you to have a cigarette.  Ask friends and co-workers not to smoke around you.  Make your home smoke-free.  Never have "just one" cigarette. It leads to wanting another and another. Remind yourself of your decision to quit.  On a card, make a list of your reasons for not smoking. Read it at least the same number of times a day as you have a cigarette. Tell yourself everyday, "I do not want to smoke. I choose not to smoke."  Ask someone at home or work to help you with your plan to quit smoking.  Have something planned after you eat or have a cup of coffee. Take a walk or get other exercise to perk you up. This will help to keep you from overeating.  Try a relaxation exercise to calm you down and decrease your stress. Remember, you may be tense and nervous the first two weeks after you quit. This will pass.  Find new activities to keep your hands busy. Play with a pen, coin, or rubber band. Doodle or draw things on paper.  Brush your teeth right after eating. This will help cut down the craving for the taste of tobacco after meals. You can try mouthwash too.  Try gum, breath mints, or diet candy to keep something in your mouth. IF YOU SMOKE AND WANT TO QUIT:  Do not stock up on cigarettes. Never buy a carton. Wait until one pack is finished before you buy another.  Never carry cigarettes with you at work or at home.  Keep cigarettes  as far away from you as possible. Leave them with someone else.  Never carry matches or a lighter with you.  Ask yourself, "Do I need this cigarette or is this just a reflex?"  Bet with someone that you can quit. Put cigarette money in a piggy bank every morning. If you smoke, you give up the money. If you do not smoke, by the end of the week, you keep the money.  Keep trying. It takes 21 days to change a habit!  Talk to your doctor about using medicines to help you quit. These include nicotine replacement gum, lozenges, or skin patches. Document Released: 11/12/2008 Document Revised: 04/10/2011 Document Reviewed: 11/12/2008 [employer] Patient Information 2014 [employer].  

## 2013-06-02 NOTE — Progress Notes (Signed)
Hematology and Oncology Follow Up Visit  Robin [name] [id number] 01-21-1946 68 y.o. 06/02/2013   Principle Diagnosis:   Leukocytosis likely reactive  Current Therapy:    Observation     Interim History:  Ms.  [name] is in for scheduled office visit. We first saw her back in December. At that point on, we did a workup for her. Her JAK2 assay was negative. She had a Primus normal-looking bone marrow. A chest x-ray was done. She doesn't smoke. Chest x-ray did not show any infiltrates or masses. She recently underwent an upper GI. She had esophageal biopsies. Nothing was noted that looked like it was malignant or pre-malignant. Her LDH was 121. Her electrolytes were all normal.  She feels okay. She's been doing a lot of work outside. She is still under a lot of stress. Her husband has metastatic prostate cancer. This does worry her.  She has no bone pain. She's had no cough. She's had no change in bowel or bladder habits. She's had no rashes.  Medications: Current outpatient prescriptions:aspirin 81 MG tablet, Take 81 mg by mouth daily., Disp: , Rfl: ;  calcium citrate-vitamin D (CITRACAL+D) 315-200 MG-UNIT per tablet, Take 1 tablet by mouth 2 (two) times daily. , Disp: , Rfl: ;  cholecalciferol (VITAMIN D) 1000 UNITS tablet, Take 1,000 Units by mouth daily., Disp: , Rfl: ;  esomeprazole (NEXIUM) 40 MG capsule, Take 40 mg by mouth as needed., Disp: , Rfl:  fish oil-omega-3 fatty acids 1000 MG capsule, Take 2 g by mouth daily., Disp: , Rfl: ;  loratadine (CLARITIN) 10 MG tablet, Take 10 mg by mouth daily., Disp: , Rfl: ;  Multiple Vitamins-Minerals (MULTIVITAMIN WITH MINERALS) tablet, Take 1 tablet by mouth daily., Disp: , Rfl: ;  pravastatin (PRAVACHOL) 80 MG tablet, Take 80 mg by mouth daily. PT DECREASED HERSELF TO 1/2 TAB, Disp: , Rfl:  vitamin E (VITAMIN E) 400 UNIT capsule, Take 400 Units by mouth daily., Disp: , Rfl: ;  losartan (COZAAR) 50 MG tablet, Take 50 mg by mouth daily. 06-02-13 PT PUT  HERSELF ON HOLD, Disp: , Rfl: ;  valACYclovir (VALTREX) 500 MG tablet, Take 500 mg by mouth as needed., Disp: , Rfl:   Allergies:  Allergies  Allergen Reactions  . Codeine Other (See Comments)    Past Medical History, Surgical history, Social history, and Family History were reviewed and updated.  Review of Systems: As above  Physical Exam:  height is 5\' 4"  (1.626 m) and weight is 133 lb (60.328 kg). Her oral temperature is 98.3 F (36.8 C). Her blood pressure is 147/70 and her pulse is 88. Her respiration is 14.   Petit white female. Head and neck exam shows no ocular or oral lesion. There are no palpable cervical or supraclavicular lymph nodes. Lungs are clear. Cardiac exam regular rate and rhythm with no murmurs rubs or bruits. Abdomen soft. Has good bowel sounds. There is no fluid wave. There is no palpable liver or spleen tip. Back exam no tenderness over the spine ribs or hips. The knees no clubbing cyanosis or edema. Skin exam no rashes. Neurological exam is nonfocal.  Lab Results  Component Value Date   WBC 15.6* 06/02/2013   HGB 12.7 06/02/2013   HCT 38.1 06/02/2013   MCV 96 06/02/2013   PLT 350 06/02/2013     Chemistry      Component Value Date/Time   NA 139 11/26/2012 0939   K 4.9 11/26/2012 0939   CL 105 11/26/2012 0939  CO2 27 11/26/2012 0939   BUN 16 11/26/2012 0939   CREATININE 0.77 11/26/2012 0939      Component Value Date/Time   CALCIUM 10.3 11/26/2012 0939   ALKPHOS 61 11/26/2012 0939   AST 18 11/26/2012 0939   ALT 14 11/26/2012 0939   BILITOT 0.5 11/26/2012 0939         Impression and Plan: Robin [name] is 68 year old white female. She has mild leukocytosis. The change between now and December is not significant.  I did look at her blood smear and I do not see anything that looked suspicious there were no immature myeloid lymphoid cells. There were no nucleated red blood cells. I saw no teardrop cells. There were no atypical lymphocytes.  Again, I really  think that this leukocytosis is reactive.  I think we can get a get her back in another 4 months or so.  I still do not see a need for a   Volanda Napoleon, MD 5/4/20152:47 PM

## 2013-06-24 ENCOUNTER — Other Ambulatory Visit: Payer: Self-pay | Admitting: *Deleted

## 2013-06-24 NOTE — Telephone Encounter (Signed)
Refill request

## 2013-06-25 MED ORDER — PRAVASTATIN SODIUM 80 MG PO TABS: ORAL_TABLET | ORAL | Status: DC

## 2013-06-25 NOTE — Telephone Encounter (Signed)
See my note

## 2013-06-25 NOTE — Telephone Encounter (Signed)
Robin [name]  Call pt and let her know that I changed her dose of Pravachol to a 40 mg tablet  (she was taking 1/2 of an 80 mg tablet).  She can take one whole one daily.   She also requested Valtrex.  Ask what she uses this for ?  I do not see anything on her new pt paperwork of why she takes Valtrex  I declined the valtrex   Message back to me her response

## 2013-06-26 NOTE — Telephone Encounter (Signed)
Pt states that she uses valacyclovir as needed for cold sores and prior to dental work

## 2013-07-07 ENCOUNTER — Other Ambulatory Visit: Payer: Self-pay | Admitting: *Deleted

## 2013-07-07 ENCOUNTER — Telehealth: Payer: Self-pay | Admitting: *Deleted

## 2013-07-07 NOTE — Telephone Encounter (Signed)
See Heather's note i do not see where you decreased her pravachol to 40 mg I see a note from Dr Marin Olp that states that pt decreased it herself last lipid in 10/14. Also do not see K+ as one of her medications

## 2013-07-07 NOTE — Telephone Encounter (Signed)
Robin [name] called and said that [facility] called her saying they can not verify directions and strength of medication.  She also needs refills on Losarten, Potassium, Nexium, and Valtrex

## 2013-07-08 HISTORY — PX: EYE SURGERY: SHX253

## 2013-07-08 MED ORDER — VALACYCLOVIR HCL 500 MG PO TABS: ORAL_TABLET | ORAL | Status: DC

## 2013-07-08 MED ORDER — ESOMEPRAZOLE MAGNESIUM 40 MG PO CPDR: 40.0000 mg | DELAYED_RELEASE_CAPSULE | ORAL | Status: DC | PRN

## 2013-07-08 MED ORDER — PRAVASTATIN SODIUM 40 MG PO TABS: 40.0000 mg | ORAL_TABLET | Freq: Every day | ORAL | Status: DC

## 2013-07-08 NOTE — Telephone Encounter (Signed)
Bobbie  Call pt and let her know that over the age of 60 she should not take 80 mg of cholesterol med  It is too high.  I will order 40 mg of Pravachol daily  I do not see where she is on K -  I am not going to re-order this    Give her a 30 min appt with me in 6-8 weeks and advise her to bring all pill bottle with her - I want to check her K and other labs at that visit when I see her  Route back to me with appt date

## 2013-09-29 ENCOUNTER — Encounter: Payer: Self-pay | Admitting: Hematology & Oncology

## 2013-09-29 ENCOUNTER — Ambulatory Visit (HOSPITAL_BASED_OUTPATIENT_CLINIC_OR_DEPARTMENT_OTHER): Payer: Medicare Other | Admitting: Hematology & Oncology

## 2013-09-29 ENCOUNTER — Other Ambulatory Visit (HOSPITAL_BASED_OUTPATIENT_CLINIC_OR_DEPARTMENT_OTHER): Payer: Medicare Other | Admitting: Lab

## 2013-09-29 VITALS — BP 134/72 | HR 65 | Temp 97.2°F | Resp 14 | Ht 64.0 in | Wt 122.0 lb

## 2013-09-29 DIAGNOSIS — D72829 Elevated white blood cell count, unspecified: Secondary | ICD-10-CM

## 2013-09-29 DIAGNOSIS — F17200 Nicotine dependence, unspecified, uncomplicated: Secondary | ICD-10-CM

## 2013-09-29 LAB — CBC WITH DIFFERENTIAL (CANCER CENTER ONLY)
BASO#: 0.1 10*3/uL (ref 0.0–0.2)
BASO%: 0.4 % (ref 0.0–2.0)
EOS%: 1.6 % (ref 0.0–7.0)
Eosinophils Absolute: 0.2 10*3/uL (ref 0.0–0.5)
HEMATOCRIT: 41.4 % (ref 34.8–46.6)
HGB: 14.1 g/dL (ref 11.6–15.9)
LYMPH#: 4.2 10*3/uL — ABNORMAL HIGH (ref 0.9–3.3)
LYMPH%: 34.2 % (ref 14.0–48.0)
MCH: 31.9 pg (ref 26.0–34.0)
MCHC: 34.1 g/dL (ref 32.0–36.0)
MCV: 94 fL (ref 81–101)
MONO#: 1.1 10*3/uL — AB (ref 0.1–0.9)
MONO%: 9.2 % (ref 0.0–13.0)
NEUT#: 6.7 10*3/uL — ABNORMAL HIGH (ref 1.5–6.5)
NEUT%: 54.6 % (ref 39.6–80.0)
PLATELETS: 349 10*3/uL (ref 145–400)
RBC: 4.42 10*6/uL (ref 3.70–5.32)
RDW: 13.9 % (ref 11.1–15.7)
WBC: 12.3 10*3/uL — AB (ref 3.9–10.0)

## 2013-09-29 LAB — CHCC SATELLITE - SMEAR

## 2013-09-29 NOTE — Progress Notes (Signed)
Hematology and Oncology Follow Up Visit  Robin [name] [id number] 03-18-1945 68 y.o. 09/29/2013   Principle Diagnosis:  Leukocytosis likely reactive  Current Therapy:    Observation     Interim History:  Ms.  [name] is followup appear sore back in May. Since then, she had cataract surgery for her left eye. This turned out quite well. She is very happy with the results. She's still smoking. This is working quite around the house. She is enjoying this.  She's not had any problem with infections. She's had no problems with cough. There's been no nausea vomiting. She's had no headache. There's been no rashes. There's been no joint swelling or pain.  So far, all of our studies have come back negative for her leukocytosis. I think this is just reactive. Certainly, it could be from her smoking.  I told her that she really has to stop smoking. We did a chest x-ray are back in March. As a came back normal.  Medications: Current outpatient prescriptions:calcium citrate-vitamin D (CITRACAL+D) 315-200 MG-UNIT per tablet, Take 1 tablet by mouth daily. , Disp: , Rfl: ;  fish oil-omega-3 fatty acids 1000 MG capsule, Take 2 g by mouth daily., Disp: , Rfl: ;  Melatonin 10 MG CAPS, Take by mouth as needed., Disp: , Rfl: ;  Multiple Vitamins-Minerals (MULTIVITAMIN WITH MINERALS) tablet, Take 1 tablet by mouth daily., Disp: , Rfl:  pravastatin (PRAVACHOL) 40 MG tablet, Take 1 tablet (40 mg total) by mouth daily., Disp: 90 tablet, Rfl: 0;  vitamin E (VITAMIN E) 400 UNIT capsule, Take 400 Units by mouth daily., Disp: , Rfl: ;  cholecalciferol (VITAMIN D) 1000 UNITS tablet, Take 1,000 Units by mouth daily., Disp: , Rfl: ;  esomeprazole (NEXIUM) 40 MG capsule, Take 1 capsule (40 mg total) by mouth as needed., Disp: 90 capsule, Rfl: 0 loratadine (CLARITIN) 10 MG tablet, Take 10 mg by mouth daily., Disp: , Rfl: ;  losartan (COZAAR) 50 MG tablet, Take 50 mg by mouth daily. 06-02-13 PT PUT HERSELF ON HOLD, Disp: , Rfl: ;   valACYclovir (VALTREX) 500 MG tablet, One tablet bid for one day as directed, Disp: 2 tablet, Rfl: 1  Allergies:  Allergies  Allergen Reactions  . Codeine Other (See Comments)    Past Medical History, Surgical history, Social history, and Family History were reviewed and updated.  Review of Systems: As above  Physical Exam:  height is 5\' 4"  (1.626 m) and weight is 122 lb (55.339 kg). Her oral temperature is 97.2 F (36.2 C). Her blood pressure is 134/72 and her pulse is 65. Her respiration is 14.   Petit white female. Head and neck exam shows no ocular or oral lesion. There are no palpable cervical or supraclavicular lymph nodes. Lungs are clear. Cardiac exam regular rate and rhythm with no murmurs rubs or bruits. Abdomen soft. Has good bowel sounds. There is no fluid wave. There is no palpable liver or spleen tip. Back exam no tenderness over the spine ribs or hips. The knees no clubbing cyanosis or edema. Skin exam no rashes. Neurological exam is nonfocal.  Lab Results  Component Value Date   WBC 12.3* 09/29/2013   HGB 14.1 09/29/2013   HCT 41.4 09/29/2013   MCV 94 09/29/2013   PLT 349 09/29/2013     Chemistry      Component Value Date/Time   NA 139 11/26/2012 0939   K 4.9 11/26/2012 0939   CL 105 11/26/2012 0939   CO2 27 11/26/2012 0939  BUN 16 11/26/2012 0939   CREATININE 0.77 11/26/2012 0939      Component Value Date/Time   CALCIUM 10.3 11/26/2012 0939   ALKPHOS 61 11/26/2012 0939   AST 18 11/26/2012 0939   ALT 14 11/26/2012 0939   BILITOT 0.5 11/26/2012 0939         Impression and Plan: [name] is 68 year old female with mild leukocytosis. Her white count is better today. Her work up has been totally negative.  I just do not see a reason or indication that she has any kind of pulmonary disorder.  I really think that we can just let her go from the practice. She's been followed closely by her family doctor. If there is any issues in the future, we will be  more happy to see her back.  Again, she really needs to stop smoking.   Robin Napoleon, MD 8/31/20155:08 PM

## 2013-10-07 ENCOUNTER — Other Ambulatory Visit: Payer: Self-pay

## 2013-10-07 NOTE — Telephone Encounter (Signed)
Robin [name] 1945-10-24 [facility]  [name] called she needs refills on her pravastatin (PRAVACHOL) 40 MG tablet / losartan (COZAAR) 50 MG tablet

## 2013-10-07 NOTE — Telephone Encounter (Signed)
Refill request

## 2013-10-08 MED ORDER — PRAVASTATIN SODIUM 40 MG PO TABS: 40.0000 mg | ORAL_TABLET | Freq: Every day | ORAL | Status: DC

## 2013-10-08 NOTE — Telephone Encounter (Signed)
Robin [name]  I reordered her cholesterol med for 3 months and she is due for her one year lab work in October  Call pt and tell her that she will need to see me in office sometime in November and to come in fasting and we can get blood work then  - will get complete panel that I get for CPE at that time

## 2013-12-01 ENCOUNTER — Ambulatory Visit (INDEPENDENT_AMBULATORY_CARE_PROVIDER_SITE_OTHER): Payer: Medicare Other | Admitting: Internal Medicine

## 2013-12-01 ENCOUNTER — Encounter: Payer: Self-pay | Admitting: Internal Medicine

## 2013-12-01 VITALS — BP 153/86 | HR 81 | Temp 98.0°F | Resp 16 | Ht 59.0 in | Wt 125.0 lb

## 2013-12-01 DIAGNOSIS — I10 Essential (primary) hypertension: Secondary | ICD-10-CM

## 2013-12-01 DIAGNOSIS — J9801 Acute bronchospasm: Secondary | ICD-10-CM

## 2013-12-01 DIAGNOSIS — Z23 Encounter for immunization: Secondary | ICD-10-CM

## 2013-12-01 DIAGNOSIS — E785 Hyperlipidemia, unspecified: Secondary | ICD-10-CM

## 2013-12-01 DIAGNOSIS — E559 Vitamin D deficiency, unspecified: Secondary | ICD-10-CM

## 2013-12-01 LAB — CBC WITH DIFFERENTIAL/PLATELET
Basophils Absolute: 0.1 10*3/uL (ref 0.0–0.1)
Basophils Relative: 1 % (ref 0–1)
Eosinophils Absolute: 0.1 10*3/uL (ref 0.0–0.7)
Eosinophils Relative: 1 % (ref 0–5)
HEMATOCRIT: 42.1 % (ref 36.0–46.0)
Hemoglobin: 14 g/dL (ref 12.0–15.0)
LYMPHS ABS: 3.3 10*3/uL (ref 0.7–4.0)
LYMPHS PCT: 27 % (ref 12–46)
MCH: 31 pg (ref 26.0–34.0)
MCHC: 33.3 g/dL (ref 30.0–36.0)
MCV: 93.3 fL (ref 78.0–100.0)
MONO ABS: 0.7 10*3/uL (ref 0.1–1.0)
MONOS PCT: 6 % (ref 3–12)
NEUTROS ABS: 8.1 10*3/uL — AB (ref 1.7–7.7)
Neutrophils Relative %: 65 % (ref 43–77)
Platelets: 377 10*3/uL (ref 150–400)
RBC: 4.51 MIL/uL (ref 3.87–5.11)
RDW: 13.4 % (ref 11.5–15.5)
WBC: 12.4 10*3/uL — AB (ref 4.0–10.5)

## 2013-12-01 LAB — TSH: TSH: 1.168 u[IU]/mL (ref 0.350–4.500)

## 2013-12-01 MED ORDER — PREDNISONE 20 MG PO TABS
ORAL_TABLET | ORAL | Status: DC
Start: 2013-12-01 — End: 2014-06-03

## 2013-12-01 MED ORDER — VALACYCLOVIR HCL 500 MG PO TABS: 500.0000 mg | ORAL_TABLET | Freq: Two times a day (BID) | ORAL | Status: DC

## 2013-12-01 MED ORDER — METHYLPREDNISOLONE ACETATE 80 MG/ML IJ SUSP
160.0000 mg | Freq: Once | INTRAMUSCULAR | Status: AC
  Administered 2013-12-01: 160 mg via INTRAMUSCULAR

## 2013-12-01 NOTE — Progress Notes (Signed)
Subjective:    Patient ID: Robin [name], female    DOB: 03/04/1945, 68 y.o.   MRN: [id number]  HPI 08/2013 oncology note Impression and Plan: Robin [name] is 68 year old female with mild leukocytosis. Her white count is better today. Her work up has been totally negative.  I just do not see a reason or indication that she has any kind of pulmonary disorder.  I really think that we can just let her go from the practice. She's been followed closely by her family doctor. If there is any issues in the future, we will be more happy to see her back.  Again, she really needs to stop smoking.  03/2013 CPE Health Maintenance: UTD with colonoscopy , mm vaccines. Counseled regarding screening chest CT She does not wish at this time. Will get last pap result [address]  Tobacco use Advised cessation She declines intervention to stop at this time  HTN Continue meds Cozaar. EKG LAFB  Hyperllipidemia will check today continue Pravachol  Minimal leukocytosis Likely reactive Will get CXR today  Colon polyps/FH colon ca in father Next colonoscopy In 3-5 years  GERD    Today   Still smoking "but I cut back"  She stopped taking Losartan as she states it caused leg cramps.  She would like ot take 1/2 pill daily  Had episode of wheezing after using Clorox bleach to clean kitchen.  Has been having dry cough ever since.    Coughs with exertion  As well  Allergies  Allergen Reactions  . Codeine Other (See Comments)   Past Medical History  Diagnosis Date  . Hypertension   . Hyperlipidemia   . Osteopenia   . Arthritis    Past Surgical History  Procedure Laterality Date  . Shoulder arthroscopy    . Oophorectomy Right    History   Social History  . Marital Status: Married    Spouse Name: N/A    Number of Children: N/A  . Years of Education: N/A   Occupational History  . Not on file.   Social History Main Topics  . Smoking status: Current Every Day Smoker -- 0.50  packs/day for 29 years    Types: Cigarettes    Start date: 01/03/1983  . Smokeless tobacco: Never Used     Comment: 06-02-13  still smoking  . Alcohol Use: 1.8 oz/week    3 Glasses of wine per week     Comment: 3 glasses daily  . Drug Use: No  . Sexual Activity: No   Other Topics Concern  . Not on file   Social History Narrative  . No narrative on file   Family History  Problem Relation Age of Onset  . Cancer Father 96    colon   . Arthritis Father   . Stroke Maternal Grandmother   . Heart disease Paternal Grandfather    Patient Active Problem List   Diagnosis Date Noted  . Screening 04/14/2013  . Leukocytosis 04/14/2013  . Elevated WBC count 12/11/2012  . HTN (hypertension) 11/26/2012  . Hyperlipidemia 11/26/2012  . DJD (degenerative joint disease) 11/26/2012  . Tobacco use 11/26/2012  . GERD (gastroesophageal reflux disease) 11/26/2012  . Osteopenia 11/26/2012  . Family history of colon cancer 11/26/2012  . Personal history of colonic polyps 11/26/2012  . Allergic rhinitis 11/26/2012  . Endometriosis 11/26/2012  . 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 calcium citrate-vitamin D (CITRACAL+D) 315-200  MG-UNIT per tablet Take 1 tablet by mouth daily.     . cholecalciferol (VITAMIN D) 1000 UNITS tablet Take 1,000 Units by mouth daily.    Marland Kitchen esomeprazole (NEXIUM) 40 MG capsule Take 1 capsule (40 mg total) by mouth as needed. 90 capsule 0  . fish oil-omega-3 fatty acids 1000 MG capsule Take 2 g by mouth daily.    Marland Kitchen loratadine (CLARITIN) 10 MG tablet Take 10 mg by mouth daily.    Marland Kitchen losartan (COZAAR) 50 MG tablet Take 50 mg by mouth daily. 06-02-13 PT PUT HERSELF ON HOLD    . Melatonin 10 MG CAPS Take by mouth as needed.    . Multiple Vitamins-Minerals (MULTIVITAMIN WITH MINERALS) tablet Take 1 tablet by mouth daily.    . pravastatin (PRAVACHOL) 40 MG tablet Take 1 tablet (40 mg total) by  mouth daily. 90 tablet 0  . valACYclovir (VALTREX) 500 MG tablet One tablet bid for one day as directed 2 tablet 1  . vitamin E (VITAMIN E) 400 UNIT capsule Take 400 Units by mouth daily.     No current facility-administered medications on file prior to visit.       Review of Systems    see HPI Objective:   Physical Exam  Physical Exam  Nursing note and vitals reviewed.  Constitutional: She is oriented to person, place, and time. She appears well-developed and well-nourished.  HENT:  Head: Normocephalic and atraumatic.  Cardiovascular: Normal rate and regular rhythm. Exam reveals no gallop and no friction rub.  No murmur heard.  Pulmonary/Chest: Breath sounds normal. She has no wheezes. She has no rales.  Neurological: She is alert and oriented to person, place, and time.  Skin: Skin is warm and dry.  Psychiatric: She has a normal mood and affect. Her behavior is normal.             Assessment & Plan:  HTN   Advised to go back on Losartan but I am not sure pt will take this on a regular basis despite my counsel  Bronchospasm  Will give depomedrol 160 mg in office with prednisone taper   Hyperlipidemia   Check today   Tobacco use   Advised cessagion she is not ready.  ADvised screening CT scan pt repeatedly declines  Leukocytosis felt to be reactive   Fever blister  Ok for short course of Valtrex  See me in December   BP check

## 2013-12-01 NOTE — Patient Instructions (Signed)
Take BP med as we discussed  Office visit late December  30 min visit

## 2013-12-02 LAB — VITAMIN D 25 HYDROXY (VIT D DEFICIENCY, FRACTURES): VIT D 25 HYDROXY: 37 ng/mL (ref 30–89)

## 2013-12-02 LAB — COMPLETE METABOLIC PANEL WITH GFR
ALBUMIN: 4.8 g/dL (ref 3.5–5.2)
ALT: 14 U/L (ref 0–35)
AST: 16 U/L (ref 0–37)
Alkaline Phosphatase: 60 U/L (ref 39–117)
BUN: 12 mg/dL (ref 6–23)
CALCIUM: 10.5 mg/dL (ref 8.4–10.5)
CHLORIDE: 102 meq/L (ref 96–112)
CO2: 20 mEq/L (ref 19–32)
Creat: 0.76 mg/dL (ref 0.50–1.10)
GFR, Est African American: >89 mL/min
GFR, Est Non African American: 82 mL/min
Glucose, Bld: 93 mg/dL (ref 70–99)
POTASSIUM: 5.1 meq/L (ref 3.5–5.3)
Sodium: 138 mEq/L (ref 135–145)
Total Bilirubin: 0.5 mg/dL (ref 0.2–1.2)
Total Protein: 7.1 g/dL (ref 6.0–8.3)

## 2013-12-02 LAB — LIPID PANEL
CHOLESTEROL: 216 mg/dL — AB (ref 0–200)
HDL: 69 mg/dL (ref >39)
LDL Cholesterol: 117 mg/dL — ABNORMAL HIGH (ref 0–99)
Total CHOL/HDL Ratio: 3.1 Ratio
Triglycerides: 152 mg/dL — ABNORMAL HIGH (ref <150)
VLDL: 30 mg/dL (ref 0–40)

## 2013-12-03 ENCOUNTER — Encounter: Payer: Self-pay | Admitting: Internal Medicine

## 2014-02-10 ENCOUNTER — Other Ambulatory Visit: Payer: Self-pay | Admitting: Internal Medicine

## 2014-02-11 ENCOUNTER — Other Ambulatory Visit: Payer: Self-pay | Admitting: *Deleted

## 2014-02-11 MED ORDER — LOSARTAN POTASSIUM 50 MG PO TABS: 50.0000 mg | ORAL_TABLET | Freq: Every day | ORAL | Status: DC

## 2014-02-11 NOTE — Telephone Encounter (Signed)
Refill request

## 2014-06-03 ENCOUNTER — Telehealth: Payer: Self-pay

## 2014-06-03 NOTE — Telephone Encounter (Signed)
Patient returned phone call. Best # [phone]

## 2014-06-03 NOTE — Telephone Encounter (Signed)
Pre visit completed 

## 2014-06-03 NOTE — Telephone Encounter (Signed)
LMTCB

## 2014-06-04 ENCOUNTER — Ambulatory Visit (INDEPENDENT_AMBULATORY_CARE_PROVIDER_SITE_OTHER): Payer: Medicare Other | Admitting: Family Medicine

## 2014-06-04 ENCOUNTER — Encounter: Payer: Self-pay | Admitting: Family Medicine

## 2014-06-04 VITALS — BP 136/94 | HR 69 | Temp 98.3°F | Resp 18 | Ht 59.0 in | Wt 128.0 lb

## 2014-06-04 DIAGNOSIS — K219 Gastro-esophageal reflux disease without esophagitis: Secondary | ICD-10-CM | POA: Diagnosis not present

## 2014-06-04 DIAGNOSIS — Z720 Tobacco use: Secondary | ICD-10-CM | POA: Diagnosis not present

## 2014-06-04 DIAGNOSIS — Z0000 Encounter for general adult medical examination without abnormal findings: Secondary | ICD-10-CM | POA: Diagnosis not present

## 2014-06-04 DIAGNOSIS — E782 Mixed hyperlipidemia: Secondary | ICD-10-CM | POA: Diagnosis not present

## 2014-06-04 DIAGNOSIS — I10 Essential (primary) hypertension: Secondary | ICD-10-CM | POA: Diagnosis not present

## 2014-06-04 DIAGNOSIS — F4321 Adjustment disorder with depressed mood: Secondary | ICD-10-CM

## 2014-06-04 DIAGNOSIS — F4320 Adjustment disorder, unspecified: Secondary | ICD-10-CM | POA: Insufficient documentation

## 2014-06-04 DIAGNOSIS — E785 Hyperlipidemia, unspecified: Secondary | ICD-10-CM

## 2014-06-04 DIAGNOSIS — M79643 Pain in unspecified hand: Secondary | ICD-10-CM

## 2014-06-04 HISTORY — DX: Adjustment disorder, unspecified: F43.20

## 2014-06-04 HISTORY — DX: Adjustment disorder with depressed mood: F43.21

## 2014-06-04 LAB — COMPREHENSIVE METABOLIC PANEL
ALT: 17 U/L (ref 0–35)
AST: 20 U/L (ref 0–37)
Albumin: 4.3 g/dL (ref 3.5–5.2)
Alkaline Phosphatase: 74 U/L (ref 39–117)
BILIRUBIN TOTAL: 0.4 mg/dL (ref 0.2–1.2)
BUN: 10 mg/dL (ref 6–23)
CO2: 27 mEq/L (ref 19–32)
Calcium: 10.4 mg/dL (ref 8.4–10.5)
Chloride: 106 mEq/L (ref 96–112)
Creatinine, Ser: 0.73 mg/dL (ref 0.40–1.20)
GFR: 84.48 mL/min (ref >60.00)
GLUCOSE: 101 mg/dL — AB (ref 70–99)
POTASSIUM: 4.6 meq/L (ref 3.5–5.1)
SODIUM: 140 meq/L (ref 135–145)
Total Protein: 7.5 g/dL (ref 6.0–8.3)

## 2014-06-04 LAB — CBC
HCT: 42.5 % (ref 36.0–46.0)
Hemoglobin: 14.6 g/dL (ref 12.0–15.0)
MCHC: 34.4 g/dL (ref 30.0–36.0)
MCV: 91.7 fl (ref 78.0–100.0)
Platelets: 401 10*3/uL — ABNORMAL HIGH (ref 150.0–400.0)
RBC: 4.63 Mil/uL (ref 3.87–5.11)
RDW: 13.7 % (ref 11.5–15.5)
WBC: 12.4 10*3/uL — AB (ref 4.0–10.5)

## 2014-06-04 LAB — LIPID PANEL
Cholesterol: 226 mg/dL — ABNORMAL HIGH (ref 0–200)
HDL: 67.5 mg/dL (ref >39.00)
LDL Cholesterol: 125 mg/dL — ABNORMAL HIGH (ref 0–99)
NonHDL: 158.5
TRIGLYCERIDES: 168 mg/dL — AB (ref 0.0–149.0)
Total CHOL/HDL Ratio: 3
VLDL: 33.6 mg/dL (ref 0.0–40.0)

## 2014-06-04 LAB — TSH: TSH: 1.22 u[IU]/mL (ref 0.35–4.50)

## 2014-06-04 LAB — RHEUMATOID FACTOR: Rhuematoid fact SerPl-aCnc: 162 IU/mL — ABNORMAL HIGH (ref <=14)

## 2014-06-04 NOTE — Progress Notes (Signed)
Pre visit review using our clinic review tool, if applicable. No additional management support is needed unless otherwise documented below in the visit note. 

## 2014-06-04 NOTE — Assessment & Plan Note (Signed)
Husband with prostate cancer with mets to bone. She declines medication at this time

## 2014-06-04 NOTE — Assessment & Plan Note (Signed)
Well controlled, no changes to meds. Encouraged heart healthy diet such as the DASH diet and exercise as tolerated.  °

## 2014-06-04 NOTE — Patient Instructions (Addendum)
Add some Co Q 10 enzyme daily  Consider Crestor daily if pains improve Send [place] a message in a month and tell us how you are doing  Preventive Care for Adults A healthy lifestyle and preventive care can promote health and wellness. Preventive health guidelines for women include the following key practices.  A routine yearly physical is a good way to check with your health care provider about your health and preventive screening. It is a chance to share any concerns and updates on your health and to receive a thorough exam.  Visit your dentist for a routine exam and preventive care every 6 months. Brush your teeth twice a day and floss once a day. Good oral hygiene prevents tooth decay and gum disease.  The frequency of eye exams is based on your age, health, family medical history, use of contact lenses, and other factors. Follow your health care provider's recommendations for frequency of eye exams.  Eat a healthy diet. Foods like vegetables, fruits, whole grains, low-fat dairy products  cts, and lean protein foods contain the nutrients you need without too many calories. Decrease your intake of foods high in solid fats, added sugars, and salt. Eat the right amount of calories for you.Get information about a proper diet from your health care provider, if necessary.  Regular physical exercise is one of the most important things you can do for your health. Most adults should get at least 150 minutes of moderate-intensity exercise (any activity that increases your heart rate and causes you to sweat) each week. In addition, most adults need muscle-strengthening exercises on 2 or more days a week.  Maintain a healthy weight. The body mass index (BMI) is a screening tool to identify possible weight problems. It provides an estimate of body fat based on height and weight. Your health care provider can find your BMI and can help you achieve or maintain a healthy weight.For adults 20 years and  older:  A BMI below 18.5 is considered underweight.  A BMI of 18.5 to 24.9 is normal.  A BMI of 25 to 29.9 is considered overweight.  A BMI of 30 and above is considered obese.  Maintain normal blood lipids and cholesterol levels by exercising and minimizing your intake of saturated fat. Eat a balanced diet with plenty of fruit and vegetables. Blood tests for lipids and cholesterol should begin at age 38 and be repeated every 5 years. If your lipid or cholesterol levels are high, you are over 50, or you are at high risk for heart disease, you may need your cholesterol levels checked more frequently.Ongoing high lipid and cholesterol levels should be treated with medicines if diet and exercise are not working.  If you smoke, find out from your health care provider how to quit. If you do not use tobacco, do not start.  Lung cancer screening is recommended for adults aged 72-80 years who are at high risk for developing lung cancer because of a history of smoking. A yearly low-dose CT scan of the lungs is recommended for people who have at least a 30-pack-year history of smoking and are a current smoker or have quit within the past 15 years. A pack year of smoking is smoking an average of 1 pack of cigarettes a day for 1 year (for example: 1 pack a day for 30 years or 2 packs a day for 15 years). Yearly screening should continue until the smoker has stopped smoking for at least 15 years. Yearly screening should  be stopped for people who develop a health problem that would prevent them from having lung cancer treatment.  If you are pregnant, do not drink alcohol. If you are breastfeeding, be very cautious about drinking alcohol. If you are not pregnant and choose to drink alcohol, do not have more than 1 drink per day. One drink is considered to be 12 ounces (355 mL) of beer, 5 ounces (148 mL) of wine, or 1.5 ounces (44 mL) of liquor.  Avoid use of street drugs. Do not share needles with anyone. Ask  for help if you need support or instructions about stopping the use of drugs.  High blood pressure causes heart disease and increases the risk of stroke. Your blood pressure should be checked at least every 1 to 2 years. Ongoing high blood pressure should be treated with medicines if weight loss and exercise do not work.  If you are 24-38 years old, ask your health care provider if you should take aspirin to prevent strokes.  Diabetes screening involves taking a blood sample to check your fasting blood sugar level. This should be done once every 3 years, after age 17, if you are within normal weight and without risk factors for diabetes. Testing should be considered at a younger age or be carried out more frequently if you are overweight and have at least 1 risk factor for diabetes.  Breast cancer screening is essential preventive care for women. You should practice "breast self-awareness." This means understanding the normal appearance and feel of your breasts and may include breast self-examination. Any changes detected, no matter how small, should be reported to a health care provider. Women in their 82s and 30s should have a clinical breast exam (CBE) by a health care provider as part of a regular health exam every 1 to 3 years. After age 42, women should have a CBE every year. Starting at age 20, women should consider having a mammogram (breast X-ray test) every year. Women who have a family history of breast cancer should talk to their health care provider about genetic screening. Women at a high risk of breast cancer should talk to their health care providers about having an MRI and a mammogram every year.  Breast cancer gene (BRCA)-related cancer risk assessment is recommended for women who have family members with BRCA-related cancers. BRCA-related cancers include breast, ovarian, tubal, and peritoneal cancers. Having family members with these cancers may be associated with an increased risk for  harmful changes (mutations) in the breast cancer genes BRCA1 and BRCA2. Results of the assessment will determine the need for genetic counseling and BRCA1 and BRCA2 testing.  Routine pelvic exams to screen for cancer are no longer recommended for nonpregnant women who are considered low risk for cancer of the pelvic organs (ovaries, uterus, and vagina) and who do not have symptoms. Ask your health care provider if a screening pelvic exam is right for you.  If you have had past treatment for cervical cancer or a condition that could lead to cancer, you need Pap tests and screening for cancer for at least 20 years after your treatment. If Pap tests have been discontinued, your risk factors (such as having a new sexual partner) need to be reassessed to determine if screening should be resumed. Some women have medical problems that increase the chance of getting cervical cancer. In these cases, your health care provider may recommend more frequent screening and Pap tests.  The HPV test is an additional test that may  be used for cervical cancer screening. The HPV test looks for the virus that can cause the cell changes on the cervix. The cells collected during the Pap test can be tested for HPV. The HPV test could be used to screen women aged 63 years and older, and should be used in women of any age who have unclear Pap test results. After the age of 43, women should have HPV testing at the same frequency as a Pap test.  Colorectal cancer can be detected and often prevented. Most routine colorectal cancer screening begins at the age of 37 years and continues through age 48 years. However, your health care provider may recommend screening at an earlier age if you have risk factors for colon cancer. On a yearly basis, your health care provider may provide home test kits to check for hidden blood in the stool. Use of a small camera at the end of a tube, to directly examine the colon (sigmoidoscopy or colonoscopy),  can detect the earliest forms of colorectal cancer. Talk to your health care provider about this at age 59, when routine screening begins. Direct exam of the colon should be repeated every 5-10 years through age 43 years, unless early forms of pre-cancerous polyps or small growths are found.  People who are at an increased risk for hepatitis B should be screened for this virus. You are considered at high risk for hepatitis B if:  You were born in a country where hepatitis B occurs often. Talk with your health care provider about which countries are considered high risk.  Your parents were born in a high-risk country and you have not received a shot to protect against hepatitis B (hepatitis B vaccine).  You have HIV or AIDS.  You use needles to inject street drugs.  You live with, or have sex with, someone who has hepatitis B.  You get hemodialysis treatment.  You take certain medicines for conditions like cancer, organ transplantation, and autoimmune conditions.  Hepatitis C blood testing is recommended for all people born from [birth year] through 1965 and any individual with known risks for hepatitis C.  Practice safe sex. Use condoms and avoid high-risk sexual practices to reduce the spread of sexually transmitted infections (STIs). STIs include gonorrhea, chlamydia, syphilis, trichomonas, herpes, HPV, and human immunodeficiency virus (HIV). Herpes, HIV, and HPV are viral illnesses that have no cure. They can result in disability, cancer, and death.  You should be screened for sexually transmitted illnesses (STIs) including gonorrhea and chlamydia if:  You are sexually active and are younger than 24 years.  You are older than 24 years and your health care provider tells you that you are at risk for this type of infection.  Your sexual activity has changed since you were last screened and you are at an increased risk for chlamydia or gonorrhea. Ask your health care provider if you are at  risk.  If you are at risk of being infected with HIV, it is recommended that you take a prescription medicine daily to prevent HIV infection. This is called preexposure prophylaxis (PrEP). You are considered at risk if:  You are a heterosexual woman, are sexually active, and are at increased risk for HIV infection.  You take drugs by injection.  You are sexually active with a partner who has HIV.  Talk with your health care provider about whether you are at high risk of being infected with HIV. If you choose to begin PrEP, you should first be tested  for HIV. You should then be tested every 3 months for as long as you are taking PrEP.  Osteoporosis is a disease in which the bones lose minerals and strength with aging. This can result in serious bone fractures or breaks. The risk of osteoporosis can be identified using a bone density scan. Women ages 38 years and over and women at risk for fractures or osteoporosis should discuss screening with their health care providers. Ask your health care provider whether you should take a calcium supplement or vitamin D to reduce the rate of osteoporosis.  Menopause can be associated with physical symptoms and risks. Hormone replacement therapy is available to decrease symptoms and risks. You should talk to your health care provider about whether hormone replacement therapy is right for you.  Use sunscreen. Apply sunscreen liberally and repeatedly throughout the day. You should seek shade when your shadow is shorter than you. Protect yourself by wearing long sleeves, pants, a wide-brimmed hat, and sunglasses year round, whenever you are outdoors.  Once a month, do a whole body skin exam, using a mirror to look at the skin on your back. Tell your health care provider of new moles, moles that have irregular borders, moles that are larger than a pencil eraser, or moles that have changed in shape or color.  Stay current with required vaccines  (immunizations).  Influenza vaccine. All adults should be immunized every year.  Tetanus, diphtheria, and acellular pertussis (Td, Tdap) vaccine. Pregnant women should receive 1 dose of Tdap vaccine during each pregnancy. The dose should be obtained regardless of the length of time since the last dose. Immunization is preferred during the 27th-36th week of gestation. An adult who has not previously received Tdap or who does not know her vaccine status should receive 1 dose of Tdap. This initial dose should be followed by tetanus and diphtheria toxoids (Td) booster doses every 10 years. Adults with an unknown or incomplete history of completing a 3-dose immunization series with Td-containing vaccines should begin or complete a primary immunization series including a Tdap dose. Adults should receive a Td booster every 10 years.  Varicella vaccine. An adult without evidence of immunity to varicella should receive 2 doses or a second dose if she has previously received 1 dose. Pregnant females who do not have evidence of immunity should receive the first dose after pregnancy. This first dose should be obtained before leaving the health care facility. The second dose should be obtained 4-8 weeks after the first dose.  Human papillomavirus (HPV) vaccine. Females aged 13-26 years who have not received the vaccine previously should obtain the 3-dose series. The vaccine is not recommended for use in pregnant females. However, pregnancy testing is not needed before receiving a dose. If a female is found to be pregnant after receiving a dose, no treatment is needed. In that case, the remaining doses should be delayed until after the pregnancy. Immunization is recommended for any person with an immunocompromised condition through the age of 82 years if she did not get any or all doses earlier. During the 3-dose series, the second dose should be obtained 4-8 weeks after the first dose. The third dose should be obtained  24 weeks after the first dose and 16 weeks after the second dose.  Zoster vaccine. One dose is recommended for adults aged 86 years or older unless certain conditions are present.  Measles, mumps, and rubella (MMR) vaccine. Adults born before [birth year] generally are considered immune to measles and mumps.  Adults born in [birth year] or later should have 1 or more doses of MMR vaccine unless there is a contraindication to the vaccine or there is laboratory evidence of immunity to each of the three diseases. A routine second dose of MMR vaccine should be obtained at least 28 days after the first dose for students attending postsecondary schools, health care workers, or international travelers. People who received inactivated measles vaccine or an unknown type of measles vaccine during 1963-1967 should receive 2 doses of MMR vaccine. People who received inactivated mumps vaccine or an unknown type of mumps vaccine before 1979 and are at high risk for mumps infection should consider immunization with 2 doses of MMR vaccine. For females of childbearing age, rubella immunity should be determined. If there is no evidence of immunity, females who are not pregnant should be vaccinated. If there is no evidence of immunity, females who are pregnant should delay immunization until after pregnancy. Unvaccinated health care workers born before [birth year] who lack laboratory evidence of measles, mumps, or rubella immunity or laboratory confirmation of disease should consider measles and mumps immunization with 2 doses of MMR vaccine or rubella immunization with 1 dose of MMR vaccine.  Pneumococcal 13-valent conjugate (PCV13) vaccine. When indicated, a person who is uncertain of her immunization history and has no record of immunization should receive the PCV13 vaccine. An adult aged 21 years or older who has certain medical conditions and has not been previously immunized should receive 1 dose of PCV13 vaccine. This PCV13 should be followed  with a dose of pneumococcal polysaccharide (PPSV23) vaccine. The PPSV23 vaccine dose should be obtained at least 8 weeks after the dose of PCV13 vaccine. An adult aged 32 years or older who has certain medical conditions and previously received 1 or more doses of PPSV23 vaccine should receive 1 dose of PCV13. The PCV13 vaccine dose should be obtained 1 or more years after the last PPSV23 vaccine dose.  Pneumococcal polysaccharide (PPSV23) vaccine. When PCV13 is also indicated, PCV13 should be obtained first. All adults aged 66 years and older should be immunized. An adult younger than age 74 years who has certain medical conditions should be immunized. Any person who resides in a nursing home or long-term care facility should be immunized. An adult smoker should be immunized. People with an immunocompromised condition and certain other conditions should receive both PCV13 and PPSV23 vaccines. People with human immunodeficiency virus (HIV) infection should be immunized as soon as possible after diagnosis. Immunization during chemotherapy or radiation therapy should be avoided. Routine use of PPSV23 vaccine is not recommended for American Indians, [place] Natives, or people younger than 65 years unless there are medical conditions that require PPSV23 vaccine. When indicated, people who have unknown immunization and have no record of immunization should receive PPSV23 vaccine. One-time revaccination 5 years after the first dose of PPSV23 is recommended for people aged 19-64 years who have chronic kidney failure, nephrotic syndrome, asplenia, or immunocompromised conditions. People who received 1-2 doses of PPSV23 before age 81 years should receive another dose of PPSV23 vaccine at age 52 years or later if at least 5 years have passed since the previous dose. Doses of PPSV23 are not needed for people immunized with PPSV23 at or after age 70 years.  Meningococcal vaccine. Adults with asplenia or persistent complement  component deficiencies should receive 2 doses of quadrivalent meningococcal conjugate (MenACWY-D) vaccine. The doses should be obtained at least 2 months apart. Microbiologists working with certain meningococcal bacteria, [employer] recruits,  people at risk during an outbreak, and people who travel to or live in countries with a high rate of meningitis should be immunized. A first-year college student up through age 43 years who is living in a residence hall should receive a dose if she did not receive a dose on or after her 16th birthday. Adults who have certain high-risk conditions should receive one or more doses of vaccine.  Hepatitis A vaccine. Adults who wish to be protected from this disease, have certain high-risk conditions, work with hepatitis A-infected animals, work in hepatitis A research labs, or travel to or work in countries with a high rate of hepatitis A should be immunized. Adults who were previously unvaccinated and who anticipate close contact with an international adoptee during the first 60 days after arrival in the [place] from a country with a high rate of hepatitis A should be immunized.  Hepatitis B vaccine. Adults who wish to be protected from this disease, have certain high-risk conditions, may be exposed to blood or other infectious body fluids, are household contacts or sex partners of hepatitis B positive people, are clients or workers in certain care facilities, or travel to or work in countries with a high rate of hepatitis B should be immunized.  Haemophilus influenzae type b (Hib) vaccine. A previously unvaccinated person with asplenia or sickle cell disease or having a scheduled splenectomy should receive 1 dose of Hib vaccine. Regardless of previous immunization, a recipient of a hematopoietic stem cell transplant should receive a 3-dose series 6-12 months after her successful transplant. Hib vaccine is not recommended for adults with HIV infection. Preventive  Services / Frequency Ages 71 to 53 years  Blood pressure check.** / Every 1 to 2 years.  Lipid and cholesterol check.** / Every 5 years beginning at age 26.  Clinical breast exam.** / Every 3 years for women in their 64s and 29s.  BRCA-related cancer risk assessment.** / For women who have family members with a BRCA-related cancer (breast, ovarian, tubal, or peritoneal cancers).  Pap test.** / Every 2 years from ages 74 through 90. Every 3 years starting at age 86 through age 4 or 39 with a history of 3 consecutive normal Pap tests.  HPV screening.** / Every 3 years from ages 25 through ages 69 to 43 with a history of 3 consecutive normal Pap tests.  Hepatitis C blood test.** / For any individual with known risks for hepatitis C.  Skin self-exam. / Monthly.  Influenza vaccine. / Every year.  Tetanus, diphtheria, and acellular pertussis (Tdap, Td) vaccine.** / Consult your health care provider. Pregnant women should receive 1 dose of Tdap vaccine during each pregnancy. 1 dose of Td every 10 years.  Varicella vaccine.** / Consult your health care provider. Pregnant females who do not have evidence of immunity should receive the first dose after pregnancy.  HPV vaccine. / 3 doses over 6 months, if 69 and younger. The vaccine is not recommended for use in pregnant females. However, pregnancy testing is not needed before receiving a dose.  Measles, mumps, rubella (MMR) vaccine.** / You need at least 1 dose of MMR if you were born in 1957 or later. You may also need a 2nd dose. For females of childbearing age, rubella immunity should be determined. If there is no evidence of immunity, females who are not pregnant should be vaccinated. If there is no evidence of immunity, females who are pregnant should delay immunization until after pregnancy.  Pneumococcal 13-valent conjugate (  PCV13) vaccine.** / Consult your health care provider.  Pneumococcal polysaccharide (PPSV23) vaccine.** / 1 to 2  doses if you smoke cigarettes or if you have certain conditions.  Meningococcal vaccine.** / 1 dose if you are age 65 to 25 years and a Market researcher living in a residence hall, or have one of several medical conditions, you need to get vaccinated against meningococcal disease. You may also need additional booster doses.  Hepatitis A vaccine.** / Consult your health care provider.  Hepatitis B vaccine.** / Consult your health care provider.  Haemophilus influenzae type b (Hib) vaccine.** / Consult your health care provider. Ages 70 to 61 years  Blood pressure check.** / Every 1 to 2 years.  Lipid and cholesterol check.** / Every 5 years beginning at age 20 years.  Lung cancer screening. / Every year if you are aged 56-80 years and have a 30-pack-year history of smoking and currently smoke or have quit within the past 15 years. Yearly screening is stopped once you have quit smoking for at least 15 years or develop a health problem that would prevent you from having lung cancer treatment.  Clinical breast exam.** / Every year after age 29 years.  BRCA-related cancer risk assessment.** / For women who have family members with a BRCA-related cancer (breast, ovarian, tubal, or peritoneal cancers).  Mammogram.** / Every year beginning at age 67 years and continuing for as long as you are in good health. Consult with your health care provider.  Pap test.** / Every 3 years starting at age 49 years through age 55 or 26 years with a history of 3 consecutive normal Pap tests.  HPV screening.** / Every 3 years from ages 75 years through ages 38 to 48 years with a history of 3 consecutive normal Pap tests.  Fecal occult blood test (FOBT) of stool. / Every year beginning at age 37 years and continuing until age 62 years. You may not need to do this test if you get a colonoscopy every 10 years.  Flexible sigmoidoscopy or colonoscopy.** / Every 5 years for a flexible sigmoidoscopy or every  10 years for a colonoscopy beginning at age 6 years and continuing until age 17 years.  Hepatitis C blood test.** / For all people born from [birth year] through 1963 and any individual with known risks for hepatitis C.  Skin self-exam. / Monthly.  Influenza vaccine. / Every year.  Tetanus, diphtheria, and acellular pertussis (Tdap/Td) vaccine.** / Consult your health care provider. Pregnant women should receive 1 dose of Tdap vaccine during each pregnancy. 1 dose of Td every 10 years.  Varicella vaccine.** / Consult your health care provider. Pregnant females who do not have evidence of immunity should receive the first dose after pregnancy.  Zoster vaccine.** / 1 dose for adults aged 49 years or older.  Measles, mumps, rubella (MMR) vaccine.** / You need at least 1 dose of MMR if you were born in 1955 or later. You may also need a 2nd dose. For females of childbearing age, rubella immunity should be determined. If there is no evidence of immunity, females who are not pregnant should be vaccinated. If there is no evidence of immunity, females who are pregnant should delay immunization until after pregnancy.  Pneumococcal 13-valent conjugate (PCV13) vaccine.** / Consult your health care provider.  Pneumococcal polysaccharide (PPSV23) vaccine.** / 1 to 2 doses if you smoke cigarettes or if you have certain conditions.  Meningococcal vaccine.** / Consult your health care provider.  Hepatitis A  vaccine.** / Consult your health care provider.  Hepatitis B vaccine.** / Consult your health care provider.  Haemophilus influenzae type b (Hib) vaccine.** / Consult your health care provider. Ages 56 years and over  Blood pressure check.** / Every 1 to 2 years.  Lipid and cholesterol check.** / Every 5 years beginning at age 77 years.  Lung cancer screening. / Every year if you are aged 49-80 years and have a 30-pack-year history of smoking and currently smoke or have quit within the past 15 years.  Yearly screening is stopped once you have quit smoking for at least 15 years or develop a health problem that would prevent you from having lung cancer treatment.  Clinical breast exam.** / Every year after age 87 years.  BRCA-related cancer risk assessment.** / For women who have family members with a BRCA-related cancer (breast, ovarian, tubal, or peritoneal cancers).  Mammogram.** / Every year beginning at age 52 years and continuing for as long as you are in good health. Consult with your health care provider.  Pap test.** / Every 3 years starting at age 86 years through age 52 or 21 years with 3 consecutive normal Pap tests. Testing can be stopped between 65 and 70 years with 3 consecutive normal Pap tests and no abnormal Pap or HPV tests in the past 10 years.  HPV screening.** / Every 3 years from ages 70 years through ages 54 or 41 years with a history of 3 consecutive normal Pap tests. Testing can be stopped between 65 and 70 years with 3 consecutive normal Pap tests and no abnormal Pap or HPV tests in the past 10 years.  Fecal occult blood test (FOBT) of stool. / Every year beginning at age 40 years and continuing until age 64 years. You may not need to do this test if you get a colonoscopy every 10 years.  Flexible sigmoidoscopy or colonoscopy.** / Every 5 years for a flexible sigmoidoscopy or every 10 years for a colonoscopy beginning at age 33 years and continuing until age 43 years.  Hepatitis C blood test.** / For all people born from [birth year] through 1963 and any individual with known risks for hepatitis C.  Osteoporosis screening.** / A one-time screening for women ages 2 years and over and women at risk for fractures or osteoporosis.  Skin self-exam. / Monthly.  Influenza vaccine. / Every year.  Tetanus, diphtheria, and acellular pertussis (Tdap/Td) vaccine.** / 1 dose of Td every 10 years.  Varicella vaccine.** / Consult your health care provider.  Zoster vaccine.** / 1  dose for adults aged 35 years or older.  Pneumococcal 13-valent conjugate (PCV13) vaccine.** / Consult your health care provider.  Pneumococcal polysaccharide (PPSV23) vaccine.** / 1 dose for all adults aged 22 years and older.  Meningococcal vaccine.** / Consult your health care provider.  Hepatitis A vaccine.** / Consult your health care provider.  Hepatitis B vaccine.** / Consult your health care provider.  Haemophilus influenzae type b (Hib) vaccine.** / Consult your health care provider. ** Family history and personal history of risk and conditions may change your health care provider's recommendations. Document Released: 03/14/2001 Document Revised: 06/02/2013 Document Reviewed: 06/13/2010 [facility] Patient Information 2015 [place], [place]. This information is not intended to replace advice given to you by your health care provider. Make sure you discuss any questions you have with your health care provider.

## 2014-06-20 ENCOUNTER — Encounter: Payer: Self-pay | Admitting: Family Medicine

## 2014-06-20 DIAGNOSIS — Z0000 Encounter for general adult medical examination without abnormal findings: Secondary | ICD-10-CM | POA: Insufficient documentation

## 2014-06-20 HISTORY — DX: Encounter for general adult medical examination without abnormal findings: Z00.00

## 2014-06-20 NOTE — Assessment & Plan Note (Addendum)
   Patient denies any difficulties at home. No trouble with ADLs, depression or falls. No recent changes to vision or hearing. Is UTD with immunizations. Is UTD with screening. Discussed Advanced Directives, patient agrees to bring [place] copies of documents if can. Encouraged heart healthy diet, exercise as tolerated and adequate sleep.  See problem list for  Risk factors See AVS for preventative health recommendations Labs ordered and reviewed. Follows with Oncology/hematology Dr Marin Olp     MMG--01/10/2015 Bone Density-- Completed CCS-- 02/05/2022

## 2014-06-20 NOTE — Assessment & Plan Note (Signed)
Avoid offending foods, start probiotics. Do not eat large meals in late evening and consider raising head of bed.  

## 2014-06-20 NOTE — Progress Notes (Signed)
Robin [name]  [id number] 04/18/1945 06/20/2014      Progress Note-Follow Up  Subjective  Chief Complaint  No chief complaint on file.   HPI  Patient is a 69 y.o. female in today for routine medical care. Patient is in today to establish care. Is struggling with some ongoing arthritic pains and knee pain. Notes that her knee pain worsened when she started pravastatin. She also notes she's had some swelling and redness. As soon as she stops pravastatin. The symptoms improved. She reports otherwise feeling well. No recent illness or acute concerns. Is taking her other meds as prescribed. Denies CP/palp/SOB/HA/congestion/fevers/GI or GU c/o. Taking meds as prescribed  Past Medical History  Diagnosis Date  . Hypertension   . Hyperlipidemia   . Osteopenia   . Arthritis   . 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 Grief reaction 06/04/2014    Past Surgical History  Procedure Laterality Date  . Shoulder arthroscopy    . Oophorectomy Right   . Eye surgery Left 07/08/13    Family History  Problem Relation Age of Onset  . Cancer Father 95    colon   . Arthritis Father     rheumatoid arthritis  . Heart disease Father     stents  . Stroke Maternal Grandmother   . Hypertension Maternal Grandmother   . Heart disease Paternal Grandfather   . COPD Mother     smoker  . Hypertension Mother   . Arthritis Sister   . Urolithiasis Sister   . Heart disease Son     History   Social History  . Marital Status: Married    Spouse Name: N/A  . Number of Children: N/A  . Years of Education: N/A   Occupational History  . Not on file.   Social History Main Topics  . Smoking status: Current Every Day Smoker -- 0.50 packs/day for 29 years    Types: Cigarettes    Start date: 01/03/1983  . Smokeless tobacco: Never Used     Comment: 06-02-13  still smoking  . Alcohol Use: 1.8 oz/week    3 Glasses of wine per week     Comment: 3 glasses daily  . Drug Use: No  . Sexual Activity: No     Comment: no  dietary restrictions, smokes, lives with husband    Other Topics Concern  . Not on file   Social History Narrative    Current Outpatient Prescriptions on File Prior to Visit  Medication Sig Dispense Refill  . esomeprazole (NEXIUM) 40 MG capsule Take 1 capsule (40 mg total) by mouth as needed. 90 capsule 0  . fish oil-omega-3 fatty acids 1000 MG capsule Take 1,200 mg by mouth daily.     Marland Kitchen loratadine (CLARITIN) 10 MG tablet Take 10 mg by mouth daily.    . Multiple Vitamins-Minerals (MULTIVITAMIN WITH MINERALS) tablet Take 1 tablet by mouth daily.    . valACYclovir (VALTREX) 500 MG tablet Take 1 tablet (500 mg total) by mouth 2 (two) times daily. Take one tablet twice a day for 2 days only 4 tablet 2  . losartan (COZAAR) 50 MG tablet Take 1 tablet (50 mg total) by mouth daily. (Patient not taking: Reported on 06/04/2014) 90 tablet 1   No current facility-administered medications on file prior to visit.    Allergies  Allergen Reactions  . Pravastatin Anaphylaxis    myalgias  . Codeine Other (See Comments)   Medication: Review, verify sig & reconcile(including outside meds):  Changes to [place],  PSH or Personal Hx:Y MMG--01/10/2015 Bone Density-- Completed CCS-- 02/05/2022  Care Teams Updated:Y  Remind to bring: DPR information, advance directives:   Review of Systems  Review of Systems  Constitutional: Negative for fever, chills and malaise/fatigue.  HENT: Negative for congestion, hearing loss and nosebleeds.   Eyes: Negative for discharge.  Respiratory: Negative for cough, sputum production, shortness of breath and wheezing.   Cardiovascular: Negative for chest pain, palpitations and leg swelling.  Gastrointestinal: Negative for heartburn, nausea, vomiting, abdominal pain, diarrhea, constipation and blood in stool.  Genitourinary: Negative for dysuria, urgency, frequency and hematuria.  Musculoskeletal: Positive for joint pain. Negative for myalgias, back pain and falls.  Skin:  Negative for rash.  Neurological: Negative for dizziness, tremors, sensory change, focal weakness, loss of consciousness, weakness and headaches.  Endo/Heme/Allergies: Negative for polydipsia. Does not bruise/bleed easily.  Psychiatric/Behavioral: Negative for depression and suicidal ideas. The patient is not nervous/anxious and does not have insomnia.     Objective  BP 136/94 mmHg  Pulse 69  Temp(Src) 98.3 F (36.8 C) (Oral)  Resp 18  Ht 4\' 11"  (1.499 m)  Wt 128 lb (58.06 kg)  BMI 25.84 kg/m2  SpO2 98%  Physical Exam  Physical Exam  Constitutional: She is oriented to person, place, and time and well-developed, well-nourished, and in no distress. No distress.  HENT:  Head: Normocephalic and atraumatic.  Right Ear: External ear normal.  Left Ear: External ear normal.  Nose: Nose normal.  Mouth/Throat: Oropharynx is clear and moist. No oropharyngeal exudate.  Eyes: Conjunctivae are normal. Pupils are equal, round, and reactive to light. Right eye exhibits no discharge. Left eye exhibits no discharge. No scleral icterus.  Neck: Normal range of motion. Neck supple. No thyromegaly present.  Cardiovascular: Normal rate, regular rhythm, normal heart sounds and intact distal pulses.   No murmur heard. Pulmonary/Chest: Effort normal and breath sounds normal. No respiratory distress. She has no wheezes. She has no rales.  Abdominal: Soft. Bowel sounds are normal. She exhibits no distension and no mass. There is no tenderness.  Musculoskeletal: Normal range of motion. She exhibits no edema or tenderness.  Lymphadenopathy:    She has no cervical adenopathy.  Neurological: She is alert and oriented to person, place, and time. She has normal reflexes. No cranial nerve deficit. Coordination normal.  Skin: Skin is warm and dry. No rash noted. She is not diaphoretic.  Psychiatric: Mood, memory and affect normal.    Lab Results  Component Value Date   TSH 1.22 06/04/2014   Lab Results    Component Value Date   WBC 12.4* 06/04/2014   HGB 14.6 06/04/2014   HCT 42.5 06/04/2014   MCV 91.7 06/04/2014   PLT 401.0* 06/04/2014   Lab Results  Component Value Date   CREATININE 0.73 06/04/2014   BUN 10 06/04/2014   NA 140 06/04/2014   K 4.6 06/04/2014   CL 106 06/04/2014   CO2 27 06/04/2014   Lab Results  Component Value Date   ALT 17 06/04/2014   AST 20 06/04/2014   ALKPHOS 74 06/04/2014   BILITOT 0.4 06/04/2014   Lab Results  Component Value Date   CHOL 226* 06/04/2014   Lab Results  Component Value Date   HDL 67.50 06/04/2014   Lab Results  Component Value Date   LDLCALC 125* 06/04/2014   Lab Results  Component Value Date   TRIG 168.0* 06/04/2014   Lab Results  Component Value Date   CHOLHDL 3 06/04/2014  Assessment & Plan  HTN (hypertension) Well controlled, no changes to meds. Encouraged heart healthy diet such as the DASH diet and exercise as tolerated.    Grief reaction Husband with prostate cancer with mets to bone. She declines medication at this time   GERD (gastroesophageal reflux disease) Avoid offending foods, start probiotics. Do not eat large meals in late evening and consider raising head of bed.    Tobacco use Encouraged complete cessation. Discussed need to quit as relates to risk of numerous cancers, cardiac and pulmonary disease as well as neurologic complications. Counseled for greater than 3 minutes   Hyperlipidemia Tolerating statin, encouraged heart healthy diet, avoid trans fats, minimize simple carbs and saturated fats. Increase exercise as tolerated   Preventative health care   Patient denies any difficulties at home. No trouble with ADLs, depression or falls. No recent changes to vision or hearing. Is UTD with immunizations. Is UTD with screening. Discussed Advanced Directives, patient agrees to bring [place] copies of documents if can. Encouraged heart healthy diet, exercise as tolerated and adequate  sleep.  See problem list for  Risk factors See AVS for preventative health recommendations Labs ordered and reviewed. Follows with Oncology/hematology Dr Marin Olp     MMG--01/10/2015 Bone Density-- Completed CCS-- 02/05/2022

## 2014-06-20 NOTE — Assessment & Plan Note (Signed)
Encouraged complete cessation. Discussed need to quit as relates to risk of numerous cancers, cardiac and pulmonary disease as well as neurologic complications. Counseled for greater than 3 minutes 

## 2014-06-20 NOTE — Assessment & Plan Note (Signed)
Tolerating statin, encouraged heart healthy diet, avoid trans fats, minimize simple carbs and saturated fats. Increase exercise as tolerated 

## 2014-07-06 ENCOUNTER — Telehealth: Payer: Self-pay | Admitting: Family Medicine

## 2014-07-06 NOTE — Telephone Encounter (Signed)
Please advise on Valtrex instructions and crestor is not on her current list.

## 2014-07-06 NOTE — Telephone Encounter (Signed)
Start Crestor 5 mg tabs 1 tab po qhs. (make note failed pravastatin with myalgias) Disp #30 with 3 rf. Also clarify how much Valtrex she wants. Her previous prescriptions were only 2 or 4 tabs. How often does she take it and does she want a daily prophylactic dose or an occasional dose for a flare?

## 2014-07-06 NOTE — Telephone Encounter (Signed)
Caller name: Shataya Loewen Relationship to patient: self Can be reached: [phone] Pharmacy: [facility]  Reason for call: Pt states that she was in and stopped a med last month. She states the pain has gone away and she is feeling much better. She would like to try the Crestor. Please send 30 day supply to [facility]. Also pt needing refill on Valtrex generic. Please send 30 day supply on that as well. Also to [facility].

## 2014-07-07 MED ORDER — ROSUVASTATIN CALCIUM 5 MG PO TABS: 5.0000 mg | ORAL_TABLET | Freq: Every day | ORAL | Status: DC

## 2014-07-07 MED ORDER — VALACYCLOVIR HCL 500 MG PO TABS
500.0000 mg | ORAL_TABLET | Freq: Two times a day (BID) | ORAL | Status: DC
Start: 2014-07-07 — End: 2016-11-06

## 2014-07-07 NOTE — Telephone Encounter (Signed)
Sent in Crestor to [facility] and [facility] to [facility].  She states only used valtrex occasionally for fever blister and dental work.  One prescription will last for a whole year.

## 2014-08-13 ENCOUNTER — Encounter: Payer: Self-pay | Admitting: *Deleted

## 2014-11-05 ENCOUNTER — Ambulatory Visit (INDEPENDENT_AMBULATORY_CARE_PROVIDER_SITE_OTHER): Payer: Medicare Other | Admitting: Family Medicine

## 2014-11-05 ENCOUNTER — Encounter: Payer: Self-pay | Admitting: Family Medicine

## 2014-11-05 VITALS — BP 128/72 | HR 77 | Temp 98.5°F | Ht <= 58 in | Wt 128.4 lb

## 2014-11-05 DIAGNOSIS — Z23 Encounter for immunization: Secondary | ICD-10-CM | POA: Diagnosis not present

## 2014-11-05 DIAGNOSIS — D72829 Elevated white blood cell count, unspecified: Secondary | ICD-10-CM | POA: Diagnosis not present

## 2014-11-05 DIAGNOSIS — I10 Essential (primary) hypertension: Secondary | ICD-10-CM | POA: Diagnosis not present

## 2014-11-05 DIAGNOSIS — L989 Disorder of the skin and subcutaneous tissue, unspecified: Secondary | ICD-10-CM

## 2014-11-05 DIAGNOSIS — Z860100 Personal history of colon polyps, unspecified: Secondary | ICD-10-CM

## 2014-11-05 DIAGNOSIS — R768 Other specified abnormal immunological findings in serum: Secondary | ICD-10-CM

## 2014-11-05 DIAGNOSIS — Z720 Tobacco use: Secondary | ICD-10-CM

## 2014-11-05 DIAGNOSIS — L578 Other skin changes due to chronic exposure to nonionizing radiation: Secondary | ICD-10-CM

## 2014-11-05 DIAGNOSIS — F4320 Adjustment disorder, unspecified: Secondary | ICD-10-CM

## 2014-11-05 DIAGNOSIS — K219 Gastro-esophageal reflux disease without esophagitis: Secondary | ICD-10-CM

## 2014-11-05 DIAGNOSIS — F4321 Adjustment disorder with depressed mood: Secondary | ICD-10-CM

## 2014-11-05 DIAGNOSIS — M1990 Unspecified osteoarthritis, unspecified site: Secondary | ICD-10-CM

## 2014-11-05 DIAGNOSIS — E785 Hyperlipidemia, unspecified: Secondary | ICD-10-CM

## 2014-11-05 LAB — COMPREHENSIVE METABOLIC PANEL
ALK PHOS: 70 U/L (ref 39–117)
ALT: 17 U/L (ref 0–35)
AST: 17 U/L (ref 0–37)
Albumin: 4.5 g/dL (ref 3.5–5.2)
BILIRUBIN TOTAL: 0.5 mg/dL (ref 0.2–1.2)
BUN: 15 mg/dL (ref 6–23)
CALCIUM: 10.4 mg/dL (ref 8.4–10.5)
CO2: 29 meq/L (ref 19–32)
CREATININE: 0.8 mg/dL (ref 0.40–1.20)
Chloride: 105 mEq/L (ref 96–112)
GFR: 75.91 mL/min (ref >60.00)
GLUCOSE: 101 mg/dL — AB (ref 70–99)
Potassium: 4.3 mEq/L (ref 3.5–5.1)
Sodium: 144 mEq/L (ref 135–145)
TOTAL PROTEIN: 7.6 g/dL (ref 6.0–8.3)

## 2014-11-05 LAB — RHEUMATOID FACTOR: RHEUMATOID FACTOR: 129 [IU]/mL — AB (ref <=14)

## 2014-11-05 LAB — LIPID PANEL
Cholesterol: 205 mg/dL — ABNORMAL HIGH (ref 0–200)
HDL: 67.1 mg/dL (ref >39.00)
NONHDL: 137.96
Total CHOL/HDL Ratio: 3
Triglycerides: 271 mg/dL — ABNORMAL HIGH (ref 0.0–149.0)
VLDL: 54.2 mg/dL — ABNORMAL HIGH (ref 0.0–40.0)

## 2014-11-05 LAB — CBC
HCT: 40.2 % (ref 36.0–46.0)
HEMOGLOBIN: 13.4 g/dL (ref 12.0–15.0)
MCHC: 33.4 g/dL (ref 30.0–36.0)
MCV: 93.6 fl (ref 78.0–100.0)
PLATELETS: 399 10*3/uL (ref 150.0–400.0)
RBC: 4.29 Mil/uL (ref 3.87–5.11)
RDW: 13.4 % (ref 11.5–15.5)
WBC: 13 10*3/uL — AB (ref 4.0–10.5)

## 2014-11-05 LAB — TSH: TSH: 1.57 u[IU]/mL (ref 0.35–4.50)

## 2014-11-05 LAB — LDL CHOLESTEROL, DIRECT: LDL DIRECT: 117 mg/dL

## 2014-11-05 MED ORDER — LOSARTAN POTASSIUM 50 MG PO TABS: 50.0000 mg | ORAL_TABLET | Freq: Every day | ORAL | Status: DC

## 2014-11-05 NOTE — Patient Instructions (Addendum)
Apply witch hazel to shoulders after showering to help with the itching. Hold off on Zostavax due to husbands condition we will readdress need in future.   Rheumatoid Arthritis Rheumatoid arthritis is a long-term (chronic) inflammatory disease that causes pain, swelling, and stiffness of the joints. It can affect the entire body, including the eyes and lungs. The effects of rheumatoid arthritis vary widely among those with the condition. CAUSES The cause of rheumatoid arthritis is not known. It tends to run in families and is more common in women. Certain cells of the body's natural defense system (immune system) do not work properly and begin to attack healthy joints. It primarily involves the connective tissue that lines the joints (synovial membrane). This can cause damage to the joint. SYMPTOMS  Pain, stiffness, swelling, and decreased motion of many joints, especially in the hands and feet.  Stiffness that is worse in the morning. It may last 1-2 hours or longer.  Numbness and tingling in the hands.  Fatigue.  Loss of appetite.  Weight loss.  Low-grade fever.  Dry eyes and mouth.  Firm lumps (rheumatoid nodules) that grow beneath the skin in areas such as the elbows and hands. DIAGNOSIS Diagnosis is based on the symptoms described, an exam, and blood tests. Sometimes, X-rays are helpful. TREATMENT The goals of treatment are to relieve pain, reduce inflammation, and to slow down or stop joint damage and disability. Methods vary and may include:  Maintaining a balance of rest, exercise, and proper nutrition.  Your health care provider may adjust your medicines every 3 months until treatment goals are reached. Common medicines include:  Pain relievers (analgesics).  Corticosteroids and nonsteroidal anti-inflammatory drugs (NSAIDs) to reduce inflammation.  Disease-modifying antirheumatic drugs (DMARDs) to try to slow the course of the disease.  Biologic response modifiers  to reduce inflammation and damage.  Physical therapy and occupational therapy.  Surgery for patients with severe joint damage. Joint replacement or fusing of joints may be needed.  Routine monitoring and ongoing care, such as office visits, blood and urine tests, and X-rays. Your health care provider will work with you to identify the best treatment option for you, based on an assessment of the overall disease activity in your body. HOME CARE INSTRUCTIONS  Remain physically active and reduce activity when the disease gets worse.  Eat a well-balanced diet.  Put heat on affected joints when you wake up and before activities. Keep the heat on the affected joint for as long as directed by your health care provider.  Put ice on affected joints following activities or exercising.  Put ice in a plastic bag.  Place a towel between your skin and the bag.  Leave the ice on for 15-20 minutes, 3-4 times per day, or as directed by your health care provider.  Take medicines and supplements only as directed by your health care provider.  Use splints as directed by your health care provider. Splints help maintain joint position and function.  Do not sleep with pillows under your knees. This may lead to spasms.  Participate in a self-management program to keep current with the latest treatment and coping skills. SEEK IMMEDIATE MEDICAL CARE IF:  You have fainting episodes.  You have periods of extreme weakness.  You rapidly develop a hot, painful joint that is more severe than usual joint aches.  You have chills.  You have a fever. FOR MORE INFORMATION  American College of Rheumatology: www.rheumatology.[place]: www.arthritis.org   This information is not  intended to replace advice given to you by your health care provider. Make sure you discuss any questions you have with your health care provider.   Document Released: 01/14/2000 Document Revised: 02/06/2014  Document Reviewed: 02/22/2011 Elsevier Interactive Patient Education [employer].

## 2014-11-05 NOTE — Progress Notes (Signed)
Pre visit review using our clinic review tool, if applicable. No additional management support is needed unless otherwise documented below in the visit note. 

## 2014-11-05 NOTE — Assessment & Plan Note (Addendum)
Repeat CBC shows mild increase, encouraged smoking cessation.

## 2014-11-05 NOTE — Assessment & Plan Note (Signed)
Well controlled, no changes to meds. Encouraged heart healthy diet such as the DASH diet and exercise as tolerated.  °

## 2014-11-05 NOTE — Progress Notes (Signed)
Patient ID: Robin [name], female   DOB: 02/14/1945, 69 y.o.   MRN: [id number]   Subjective:    Patient ID: Robin [name], female    DOB: [birth year], 69 y.o.   MRN: [id number]  Chief Complaint  Patient presents with  . Follow-up    HPI Patient is in today for follow-up. She continues to struggle with her husbands problem stage IV prostate cancer and he is recently been diagnosed with new metastases. She is struggling with anxiety. No anhedonia. Acknowledges some fatigue. No recent febrile illness. Continues to struggle with chronic pain. Hand pain and knee pain are most notable. Denies CP/palp/SOB/HA/congestion/fevers/GI or GU c/o. Taking meds as prescribed  Past Medical History  Diagnosis Date  . Hypertension   . Hyperlipidemia   . Osteopenia   . Arthritis   . 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 Grief reaction 06/04/2014  . Medicare annual wellness visit, subsequent 06/20/2014  . Preventative health care 06/20/2014    Medication: Review, verify sig & reconcile(including outside meds): Duplicates discarded: DM supply source:  Preferred Pharmacy and which med where:[facility] ([place]) [place], [place] 90 day supply/mail order: 90 Local pharmacy: [place], [place]  Allergies verified:UTD  Immunization Status: Prompt    Past Surgical History  Procedure Laterality Date  . Shoulder arthroscopy    . Oophorectomy Right   . Eye surgery Left 07/08/13    Family History  Problem Relation Age of Onset  . Cancer Father 67    colon   . Arthritis Father     rheumatoid arthritis  . Heart disease Father     stents  . Stroke Maternal Grandmother   . Hypertension Maternal Grandmother   . Heart disease Paternal Grandfather   . COPD Mother     smoker  . Hypertension Mother   . Arthritis Sister   . Urolithiasis Sister   . Heart disease Son     Social History   Social History  . Marital Status: Married   Spouse Name: N/A  . Number of Children: N/A  . Years of Education: N/A   Occupational History  . Not on file.   Social History Main Topics  . Smoking status: Current Every Day Smoker -- 0.50 packs/day for 29 years    Types: Cigarettes    Start date: 01/03/1983  . Smokeless tobacco: Never Used     Comment: 06-02-13  still smoking  . Alcohol Use: 1.8 oz/week    3 Glasses of wine per week     Comment: 3 glasses daily  . Drug Use: No  . Sexual Activity: No     Comment: no dietary restrictions, smokes, lives with husband    Other Topics Concern  . Not on file   Social History Narrative    Outpatient Prescriptions Prior to Visit  Medication Sig Dispense Refill  . Biotin 1 MG CAPS Take by mouth.    . calcium-vitamin D (OSCAL WITH D) 500-200 MG-UNIT per tablet Take 1 tablet by mouth.    . esomeprazole (NEXIUM) 40 MG capsule Take 1 capsule (40 mg total) by mouth as needed. 90 capsule 0  . fish oil-omega-3 fatty acids 1000 MG capsule Take 1,200 mg by mouth daily.     Marland Kitchen loratadine (CLARITIN) 10 MG tablet Take 10 mg by mouth daily.    . Multiple Vitamins-Minerals (MULTIVITAMIN WITH MINERALS) tablet Take 1 tablet by mouth daily.    Marland Kitchen  valACYclovir (VALTREX) 500 MG tablet Take 1 tablet (500 mg total) by mouth 2 (two) times daily. 60 tablet 1  . losartan (COZAAR) 50 MG tablet Take 1 tablet (50 mg total) by mouth daily. 90 tablet 1  . rosuvastatin (CRESTOR) 5 MG tablet Take 1 tablet (5 mg total) by mouth daily. 90 tablet 2   No facility-administered medications prior to visit.    Allergies  Allergen Reactions  . Pravastatin Anaphylaxis    myalgias  . Codeine Other (See Comments)    Review of Systems  Constitutional: Negative for fever and malaise/fatigue.  HENT: Negative for congestion.   Eyes: Negative for discharge.  Respiratory: Negative for shortness of breath.   Cardiovascular: Negative for chest pain, palpitations and leg swelling.  Gastrointestinal: Negative for nausea and  abdominal pain.  Genitourinary: Negative for dysuria.  Musculoskeletal: Positive for myalgias, back pain and joint pain. Negative for falls.  Skin: Negative for rash.  Neurological: Negative for loss of consciousness and headaches.  Endo/Heme/Allergies: Negative for environmental allergies.  Psychiatric/Behavioral: Negative for depression. The patient is nervous/anxious.        Objective:    Physical Exam  Constitutional: She is oriented to person, place, and time. She appears well-developed and well-nourished. No distress.  HENT:  Head: Normocephalic and atraumatic.  Nose: Nose normal.  Eyes: Right eye exhibits no discharge. Left eye exhibits no discharge.  Neck: Normal range of motion. Neck supple.  Cardiovascular: Normal rate and regular rhythm.   No murmur heard. Pulmonary/Chest: Effort normal and breath sounds normal.  Abdominal: Soft. Bowel sounds are normal. There is no tenderness.  Musculoskeletal: She exhibits no edema.  Neurological: She is alert and oriented to person, place, and time.  Skin: Skin is warm and dry.  Psychiatric: She has a normal mood and affect.  Nursing note and vitals reviewed.   BP 128/72 mmHg  Pulse 77  Temp(Src) 98.5 F (36.9 C) (Oral)  Ht 4\' 9"  (1.448 m)  Wt 128 lb 6 oz (58.231 kg)  BMI 27.77 kg/m2  SpO2 97% Wt Readings from Last 3 Encounters:  11/05/14 128 lb 6 oz (58.231 kg)  06/04/14 128 lb (58.06 kg)  12/01/13 125 lb (56.7 kg)     Lab Results  Component Value Date   WBC 13.0* 11/05/2014   HGB 13.4 11/05/2014   HCT 40.2 11/05/2014   PLT 399.0 11/05/2014   GLUCOSE 101* 11/05/2014   CHOL 205* 11/05/2014   TRIG 271.0* 11/05/2014   HDL 67.10 11/05/2014   LDLDIRECT 117.0 11/05/2014   LDLCALC 125* 06/04/2014   ALT 17 11/05/2014   AST 17 11/05/2014   NA 144 11/05/2014   K 4.3 11/05/2014   CL 105 11/05/2014   CREATININE 0.80 11/05/2014   BUN 15 11/05/2014   CO2 29 11/05/2014   TSH 1.57 11/05/2014    Lab Results    Component Value Date   TSH 1.57 11/05/2014   Lab Results  Component Value Date   WBC 13.0* 11/05/2014   HGB 13.4 11/05/2014   HCT 40.2 11/05/2014   MCV 93.6 11/05/2014   PLT 399.0 11/05/2014   Lab Results  Component Value Date   NA 144 11/05/2014   K 4.3 11/05/2014   CO2 29 11/05/2014   GLUCOSE 101* 11/05/2014   BUN 15 11/05/2014   CREATININE 0.80 11/05/2014   BILITOT 0.5 11/05/2014   ALKPHOS 70 11/05/2014   AST 17 11/05/2014   ALT 17 11/05/2014   PROT 7.6 11/05/2014   ALBUMIN 4.5 11/05/2014  CALCIUM 10.4 11/05/2014   GFR 75.91 11/05/2014   Lab Results  Component Value Date   CHOL 205* 11/05/2014   Lab Results  Component Value Date   HDL 67.10 11/05/2014   Lab Results  Component Value Date   LDLCALC 125* 06/04/2014   Lab Results  Component Value Date   TRIG 271.0* 11/05/2014   Lab Results  Component Value Date   CHOLHDL 3 11/05/2014   No results found for: HGBA1C     Assessment & Plan:   Problem List Items Addressed This Visit    Tobacco use    Encouraged complete cessation. Discussed need to quit as relates to risk of numerous cancers, cardiac and pulmonary disease as well as neurologic complications. Counseled for greater than 3 minutes      Personal history of colonic polyps    Last colonoscopy 2 small polyps      Leukocytosis    Repeat CBC shows mild increase, encouraged smoking cessation.      Hyperlipidemia    Tolerating statin, encouraged heart healthy diet, avoid trans fats, minimize simple carbs and saturated fats. Increase exercise as tolerated      Relevant Medications   losartan (COZAAR) 50 MG tablet   Other Relevant Orders   Lipid panel (Completed)   HTN (hypertension)    Well controlled, no changes to meds. Encouraged heart healthy diet such as the DASH diet and exercise as tolerated.       Relevant Medications   losartan (COZAAR) 50 MG tablet   Other Relevant Orders   CBC (Completed)   Comprehensive metabolic panel  (Completed)   TSH (Completed)   Ambulatory referral to Dermatology   Grief reaction    Patient is struggling with stage 4 Prostate cancer and new mets. She is tearful about the current situation but she feels she is managing well      GERD (gastroesophageal reflux disease)    Avoid offending foods, start probiotics. Do not eat large meals in late evening and consider raising head of bed. Continue Esomeprazole as needed      RESOLVED: Elevated WBC count   Relevant Orders   CBC (Completed)   DJD (degenerative joint disease)    C/o b/l hand pain, knee pain and a markedly elevated RF. Referred to rheumatology for further consideration      Relevant Orders   Ambulatory referral to Rheumatology    Other Visit Diagnoses    Need for prophylactic vaccination against Streptococcus pneumoniae (pneumococcus)    -  Primary    Relevant Orders    Pneumococcal conjugate vaccine 13-valent    Encounter for immunization        Elevated rheumatoid factor        Relevant Orders    Ambulatory referral to Rheumatology    Rheumatoid Factor (Completed)    Sun-damaged skin        Relevant Orders    Ambulatory referral to Dermatology    Skin lesion        Relevant Orders    Ambulatory referral to Dermatology       I am having Ms. Hassan maintain her fish oil-omega-3 fatty acids, loratadine, multivitamin with minerals, esomeprazole, calcium-vitamin D, Biotin, valACYclovir, and losartan.  Meds ordered this encounter  Medications  . losartan (COZAAR) 50 MG tablet    Sig: Take 1 tablet (50 mg total) by mouth daily.    Dispense:  90 tablet    Refill:  2     Penni Homans, MD

## 2014-11-06 ENCOUNTER — Other Ambulatory Visit: Payer: Self-pay | Admitting: Family Medicine

## 2014-11-06 ENCOUNTER — Encounter: Payer: Self-pay | Admitting: Family Medicine

## 2014-11-06 MED ORDER — ROSUVASTATIN CALCIUM 10 MG PO TABS: 10.0000 mg | ORAL_TABLET | Freq: Every day | ORAL | Status: DC

## 2014-11-08 ENCOUNTER — Encounter: Payer: Self-pay | Admitting: Family Medicine

## 2014-11-08 NOTE — Assessment & Plan Note (Signed)
Encouraged complete cessation. Discussed need to quit as relates to risk of numerous cancers, cardiac and pulmonary disease as well as neurologic complications. Counseled for greater than 3 minutes 

## 2014-11-08 NOTE — Assessment & Plan Note (Signed)
Last colonoscopy 2 small polyps

## 2014-11-08 NOTE — Assessment & Plan Note (Signed)
C/o b/l hand pain, knee pain and a markedly elevated RF. Referred to rheumatology for further consideration

## 2014-11-08 NOTE — Assessment & Plan Note (Signed)
Tolerating statin, encouraged heart healthy diet, avoid trans fats, minimize simple carbs and saturated fats. Increase exercise as tolerated 

## 2014-11-08 NOTE — Assessment & Plan Note (Signed)
Avoid offending foods, start probiotics. Do not eat large meals in late evening and consider raising head of bed. Continue Esomeprazole as needed

## 2014-11-08 NOTE — Assessment & Plan Note (Signed)
Patient is struggling with stage 4 Prostate cancer and new mets. She is tearful about the current situation but she feels she is managing well

## 2014-11-24 ENCOUNTER — Ambulatory Visit: Payer: Self-pay | Admitting: Family Medicine

## 2014-12-09 ENCOUNTER — Ambulatory Visit (INDEPENDENT_AMBULATORY_CARE_PROVIDER_SITE_OTHER): Payer: Medicare Other

## 2014-12-09 DIAGNOSIS — Z23 Encounter for immunization: Secondary | ICD-10-CM

## 2015-01-01 LAB — HM MAMMOGRAPHY

## 2015-01-04 ENCOUNTER — Encounter: Payer: Self-pay | Admitting: Family Medicine

## 2015-05-05 ENCOUNTER — Telehealth: Payer: Self-pay

## 2015-05-05 NOTE — Telephone Encounter (Signed)
Pre-Visit call made to patient. 

## 2015-05-06 ENCOUNTER — Encounter: Payer: Self-pay | Admitting: Family Medicine

## 2015-05-06 ENCOUNTER — Ambulatory Visit (INDEPENDENT_AMBULATORY_CARE_PROVIDER_SITE_OTHER): Payer: Medicare Other | Admitting: Family Medicine

## 2015-05-06 VITALS — BP 120/82 | HR 76 | Temp 98.8°F | Ht 59.0 in | Wt 132.5 lb

## 2015-05-06 DIAGNOSIS — E785 Hyperlipidemia, unspecified: Secondary | ICD-10-CM | POA: Diagnosis not present

## 2015-05-06 DIAGNOSIS — I10 Essential (primary) hypertension: Secondary | ICD-10-CM

## 2015-05-06 DIAGNOSIS — M8580 Other specified disorders of bone density and structure, unspecified site: Secondary | ICD-10-CM

## 2015-05-06 DIAGNOSIS — Z0000 Encounter for general adult medical examination without abnormal findings: Secondary | ICD-10-CM | POA: Insufficient documentation

## 2015-05-06 DIAGNOSIS — Z720 Tobacco use: Secondary | ICD-10-CM

## 2015-05-06 DIAGNOSIS — F4321 Adjustment disorder with depressed mood: Secondary | ICD-10-CM

## 2015-05-06 NOTE — Assessment & Plan Note (Signed)
Tolerating statin, encouraged heart healthy diet, avoid trans fats, minimize simple carbs and saturated fats. Increase exercise as tolerated 

## 2015-05-06 NOTE — Assessment & Plan Note (Signed)
Her husband is doing well with his prostate cancer so she is doing better.

## 2015-05-06 NOTE — Progress Notes (Signed)
Pre visit review using our clinic review tool, if applicable. No additional management support is needed unless otherwise documented below in the visit note. 

## 2015-05-06 NOTE — Progress Notes (Signed)
Subjective:    Patient ID: Robin [name], female    DOB: [birth year], 70 y.o.   MRN: [id number]  Chief Complaint  Patient presents with  . Medicare Wellness    HPI Patient is in today for wellness visit and follow up. No recent illness or acute concerns. No difficulties with ADLs at home. 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 Denies CP/palp/SOB/HA/congestion/fevers/GI or GU c/o. Taking meds as prescribed  Past Medical History  Diagnosis Date  . Hypertension   . Hyperlipidemia   . Osteopenia   . Arthritis   . 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 Grief reaction 06/04/2014  . Medicare annual wellness visit, subsequent 06/20/2014  . Preventative health care 06/20/2014    Medication: Review, verify sig & reconcile(including outside meds): Duplicates discarded: DM supply source:  Preferred Pharmacy and which med where:[facility] ([facility] ORDER) [place], [place] 90 day supply/mail order: 90 Local pharmacy: [place], [place]  Allergies verified:UTD  Immunization Status: Prompt  . Medicare annual wellness visit, subsequent 05/06/2015    Past Surgical History  Procedure Laterality Date  . Shoulder arthroscopy    . Oophorectomy Right   . Eye surgery Left 07/08/13    Family History  Problem Relation Age of Onset  . Cancer Father 47    colon, preCA?  Marland Kitchen Arthritis Father     rheumatoid arthritis  . Heart disease Father     stents  . Stroke Maternal Grandmother   . Hypertension Maternal Grandmother   . Heart disease Paternal Grandfather   . COPD Mother     smoker  . Hypertension Mother   . Arthritis Sister   . Urolithiasis Sister   . Heart disease Son     Social History   Social History  . Marital Status: Married    Spouse Name: N/A  . Number of Children: N/A  . Years of Education: N/A   Occupational History  . Not on file.   Social History  Main Topics  . Smoking status: Current Every Day Smoker -- 0.50 packs/day for 29 years    Types: Cigarettes    Start date: 01/03/1983  . Smokeless tobacco: Never Used     Comment: 06-02-13  still smoking  . Alcohol Use: 1.8 oz/week    3 Glasses of wine per week     Comment: 3 glasses daily  . Drug Use: No  . Sexual Activity: No     Comment: no dietary restrictions, smokes, lives with husband    Other Topics Concern  . Not on file   Social History Narrative    Outpatient Prescriptions Prior to Visit  Medication Sig Dispense Refill  . Biotin 1 MG CAPS Take by mouth.    . calcium-vitamin D (OSCAL WITH D) 500-200 MG-UNIT per tablet Take 1 tablet by mouth.    . esomeprazole (NEXIUM) 40 MG capsule Take 1 capsule (40 mg total) by mouth as needed. 90 capsule 0  . fish oil-omega-3 fatty acids 1000 MG capsule Take 1,200 mg by mouth daily.     Marland Kitchen loratadine (CLARITIN) 10 MG tablet Take 10 mg by mouth daily.    Marland Kitchen losartan (COZAAR) 50 MG tablet Take 1 tablet (50 mg total) by mouth daily. (Patient taking differently: Take 50 mg by mouth daily. Patient taking 1/2 daily due to pain) 90 tablet 2  . Multiple Vitamins-Minerals (  MULTIVITAMIN WITH MINERALS) tablet Take 1 tablet by mouth daily.    . rosuvastatin (CRESTOR) 10 MG tablet Take 1 tablet (10 mg total) by mouth daily. 90 tablet 1  . valACYclovir (VALTREX) 500 MG tablet Take 1 tablet (500 mg total) by mouth 2 (two) times daily. (Patient taking differently: Take 500 mg by mouth 2 (two) times daily as needed. ) 60 tablet 1   No facility-administered medications prior to visit.    Allergies  Allergen Reactions  . Pravastatin Anaphylaxis    myalgias  . Codeine Other (See Comments)    Review of Systems  Constitutional: Negative for fever, chills and malaise/fatigue.  HENT: Negative for congestion and hearing loss.   Eyes: Negative for discharge.  Respiratory: Negative for cough, sputum production and shortness of breath.   Cardiovascular:  Negative for chest pain, palpitations and leg swelling.  Gastrointestinal: Negative for heartburn, nausea, vomiting, abdominal pain, diarrhea, constipation and blood in stool.  Genitourinary: Negative for dysuria, urgency, frequency and hematuria.  Musculoskeletal: Negative for myalgias, back pain and falls.  Skin: Negative for rash.  Neurological: Negative for dizziness, sensory change, loss of consciousness, weakness and headaches.  Endo/Heme/Allergies: Negative for environmental allergies. Does not bruise/bleed easily.  Psychiatric/Behavioral: Negative for depression and suicidal ideas. The patient is not nervous/anxious and does not have insomnia.        Objective:    Physical Exam  Constitutional: She is oriented to person, place, and time. She appears well-developed and well-nourished. No distress.  HENT:  Head: Normocephalic and atraumatic.  Eyes: Conjunctivae are normal.  Neck: Neck supple. No thyromegaly present.  Cardiovascular: Normal rate, regular rhythm and normal heart sounds.   No murmur heard. Pulmonary/Chest: Effort normal and breath sounds normal. No respiratory distress.  Abdominal: Soft. Bowel sounds are normal. She exhibits no distension and no mass. There is no tenderness.  Musculoskeletal: She exhibits no edema.  Lymphadenopathy:    She has no cervical adenopathy.  Neurological: She is alert and oriented to person, place, and time.  Skin: Skin is warm and dry.  Psychiatric: She has a normal mood and affect. Her behavior is normal.    BP 120/82 mmHg  Pulse 76  Temp(Src) 98.8 F (37.1 C) (Oral)  Ht 4' 11"  (1.499 m)  Wt 132 lb 8 oz (60.102 kg)  BMI 26.75 kg/m2  SpO2 95% Wt Readings from Last 3 Encounters:  05/06/15 132 lb 8 oz (60.102 kg)  11/05/14 128 lb 6 oz (58.231 kg)  06/04/14 128 lb (58.06 kg)     Lab Results  Component Value Date   WBC 13.0* 11/05/2014   HGB 13.4 11/05/2014   HCT 40.2 11/05/2014   PLT 399.0 11/05/2014   GLUCOSE 101*  11/05/2014   CHOL 205* 11/05/2014   TRIG 271.0* 11/05/2014   HDL 67.10 11/05/2014   LDLDIRECT 117.0 11/05/2014   LDLCALC 125* 06/04/2014   ALT 17 11/05/2014   AST 17 11/05/2014   NA 144 11/05/2014   K 4.3 11/05/2014   CL 105 11/05/2014   CREATININE 0.80 11/05/2014   BUN 15 11/05/2014   CO2 29 11/05/2014   TSH 1.57 11/05/2014    Lab Results  Component Value Date   TSH 1.57 11/05/2014   Lab Results  Component Value Date   WBC 13.0* 11/05/2014   HGB 13.4 11/05/2014   HCT 40.2 11/05/2014   MCV 93.6 11/05/2014   PLT 399.0 11/05/2014   Lab Results  Component Value Date   NA 144 11/05/2014  K 4.3 11/05/2014   CO2 29 11/05/2014   GLUCOSE 101* 11/05/2014   BUN 15 11/05/2014   CREATININE 0.80 11/05/2014   BILITOT 0.5 11/05/2014   ALKPHOS 70 11/05/2014   AST 17 11/05/2014   ALT 17 11/05/2014   PROT 7.6 11/05/2014   ALBUMIN 4.5 11/05/2014   CALCIUM 10.4 11/05/2014   GFR 75.91 11/05/2014   Lab Results  Component Value Date   CHOL 205* 11/05/2014   Lab Results  Component Value Date   HDL 67.10 11/05/2014   Lab Results  Component Value Date   LDLCALC 125* 06/04/2014   Lab Results  Component Value Date   TRIG 271.0* 11/05/2014   Lab Results  Component Value Date   CHOLHDL 3 11/05/2014   No results found for: HGBA1C     Assessment & Plan:   Problem List Items Addressed This Visit    Grief reaction    Her husband is doing well with his prostate cancer so she is doing better.      Relevant Orders   CBC   TSH   Comp Met (CMET)   Lipid panel   HTN (hypertension)    Well controlled, no changes to meds. Encouraged heart healthy diet such as the DASH diet and exercise as tolerated.       Relevant Orders   CBC   TSH   Comp Met (CMET)   Lipid panel   Hyperlipidemia    Tolerating statin, encouraged heart healthy diet, avoid trans fats, minimize simple carbs and saturated fats. Increase exercise as tolerated      Relevant Orders   CBC   TSH   Comp  Met (CMET)   Lipid panel   Medicare annual wellness visit, subsequent - Primary    Patient denies any difficulties at home. No trouble with ADLs, depression or falls. See EMR for functional status screen and depression screen. No recent changes to vision or hearing. Is UTD with immunizations. Is UTD with screening. Discussed Advanced Directives. Encouraged heart healthy diet, exercise as tolerated and adequate sleep. See patient's problem list for health risk factors to monitor. See AVS for preventative healthcare recommendation schedule.  Preferred Pharmacy and which med where:[facility] ([facility]IL ORDER) [place], [place] 90 day supply/mail order: 90 Local pharmacy: [place], [place]  Allergies verified:UTD  Immunization Status: Prompted for insurance verification: Y PNA-- DUE  Shingles--  A/P:  Changes to [place], [place] or Personal Hx:Y MMG--01/10/2015 Bone Density-- Completed [facility]CS-- 02/05/2022  Care Teams Updated:Y ED/Hospital/Urgent Care Visits:N Prompted for: Updated insurance, contact information, forms: Remind to bring: DPR information, advance directives:   To Discuss with Provider: Pt states she's having pain and swelling in legs and hands for 1.5 weeks would like to know if it's because of her cozaar or pravastatin.      Relevant Orders   CBC   TSH   Comp Met (CMET)   Lipid panel   Osteopenia    Encouraged to get adequate exercise, calcium and vitamin d intake      Tobacco use    Encouraged complete cessation. Discussed need to quit as relates to risk of numerous cancers, cardiac and pulmonary disease as well as neurologic complications. Counseled for greater than 3 minutes         I am having Ms. Kasel maintain her fish oil-omega-3 fatty acids, loratadine, multivitamin with minerals, esomeprazole, calcium-vitamin D, Biotin, valACYclovir, losartan, and  rosuvastatin.  No  orders of the defined types were placed in this encounter.     Penni Homans, MD

## 2015-05-06 NOTE — Assessment & Plan Note (Signed)
Patient denies any difficulties at home. No trouble with ADLs, depression or falls. See EMR for functional status screen and depression screen. No recent changes to vision or hearing. Is UTD with immunizations. Is UTD with screening. Discussed Advanced Directives. Encouraged heart healthy diet, exercise as tolerated and adequate sleep. See patient's problem list for health risk factors to monitor. See AVS for preventative healthcare recommendation schedule.  Preferred Pharmacy and which med where:[facility] ([facility]IL ORDER) [place], [place] 90 day supply/mail order: 90 Local pharmacy: [place], [place]  Allergies verified:UTD  Immunization Status: Prompted for insurance verification: Y PNA-- DUE  Shingles--  A/P:  Changes to [place], [place] or Personal Hx:Y MMG--01/10/2015 Bone Density-- Completed CCS-- 02/05/2022  Care Teams Updated:Y ED/Hospital/Urgent Care Visits:N Prompted for: Updated insurance, contact information, forms: Remind to bring: DPR information, advance directives:   To Discuss with Provider: Pt states she's having pain and swelling in legs and hands for 1.5 weeks would like to know if it's because of her cozaar or pravastatin.

## 2015-05-06 NOTE — Patient Instructions (Signed)
Preventive Care for Adults, Female A healthy lifestyle and preventive care can promote health and wellness. Preventive health guidelines for women include the following key practices.  A routine yearly physical is a good way to check with your health care provider about your health and preventive screening. It is a chance to share any concerns and updates on your health and to receive a thorough exam.  Visit your dentist for a routine exam and preventive care every 6 months. Brush your teeth twice a day and floss once a day. Good oral hygiene prevents tooth decay and gum disease.  The frequency of eye exams is based on your age, health, family medical history, use of contact lenses, and other factors. Follow your health care provider's recommendations for frequency of eye exams.  Eat a healthy diet. Foods like vegetables, fruits, whole grains, low-fat dairy products, and lean protein foods contain the nutrients you need without too many calories. Decrease your intake of foods high in solid fats, added sugars, and salt. Eat the right amount of calories for you.Get information about a proper diet from your health care provider, if necessary.  Regular physical exercise is one of the most important things you can do for your health. Most adults should get at least 150 minutes of moderate-intensity exercise (any activity that increases your heart rate and causes you to sweat) each week. In addition, most adults need muscle-strengthening exercises on 2 or more days a week.  Maintain a healthy weight. The body mass index (BMI) is a screening tool to identify possible weight problems. It provides an estimate of body fat based on height and weight. Your health care provider can find your BMI and can help you achieve or maintain a healthy weight.For adults 20 years and older:  A BMI below 18.5 is considered underweight.  A BMI of 18.5 to 24.9 is normal.  A BMI of 25 to 29.9 is considered overweight.  A  BMI of 30 and above is considered obese.  Maintain normal blood lipids and cholesterol levels by exercising and minimizing your intake of saturated fat. Eat a balanced diet with plenty of fruit and vegetables. Blood tests for lipids and cholesterol should begin at age 45 and be repeated every 5 years. If your lipid or cholesterol levels are high, you are over 50, or you are at high risk for heart disease, you may need your cholesterol levels checked more frequently.Ongoing high lipid and cholesterol levels should be treated with medicines if diet and exercise are not working.  If you smoke, find out from your health care provider how to quit. If you do not use tobacco, do not start.  Lung cancer screening is recommended for adults aged 45-80 years who are at high risk for developing lung cancer because of a history of smoking. A yearly low-dose CT scan of the lungs is recommended for people who have at least a 30-pack-year history of smoking and are a current smoker or have quit within the past 15 years. A pack year of smoking is smoking an average of 1 pack of cigarettes a day for 1 year (for example: 1 pack a day for 30 years or 2 packs a day for 15 years). Yearly screening should continue until the smoker has stopped smoking for at least 15 years. Yearly screening should be stopped for people who develop a health problem that would prevent them from having lung cancer treatment.  If you are pregnant, do not drink alcohol. If you are  breastfeeding, be very cautious about drinking alcohol. If you are not pregnant and choose to drink alcohol, do not have more than 1 drink per day. One drink is considered to be 12 ounces (355 mL) of beer, 5 ounces (148 mL) of wine, or 1.5 ounces (44 mL) of liquor.  Avoid use of street drugs. Do not share needles with anyone. Ask for help if you need support or instructions about stopping the use of drugs.  High blood pressure causes heart disease and increases the risk  of stroke. Your blood pressure should be checked at least every 1 to 2 years. Ongoing high blood pressure should be treated with medicines if weight loss and exercise do not work.  If you are 55-79 years old, ask your health care provider if you should take aspirin to prevent strokes.  Diabetes screening is done by taking a blood sample to check your blood glucose level after you have not eaten for a certain period of time (fasting). If you are not overweight and you do not have risk factors for diabetes, you should be screened once every 3 years starting at age 45. If you are overweight or obese and you are 40-70 years of age, you should be screened for diabetes every year as part of your cardiovascular risk assessment.  Breast cancer screening is essential preventive care for women. You should practice "breast self-awareness." This means understanding the normal appearance and feel of your breasts and may include breast self-examination. Any changes detected, no matter how small, should be reported to a health care provider. Women in their 20s and 30s should have a clinical breast exam (CBE) by a health care provider as part of a regular health exam every 1 to 3 years. After age 40, women should have a CBE every year. Starting at age 40, women should consider having a mammogram (breast X-ray test) every year. Women who have a family history of breast cancer should talk to their health care provider about genetic screening. Women at a high risk of breast cancer should talk to their health care providers about having an MRI and a mammogram every year.  Breast cancer gene (BRCA)-related cancer risk assessment is recommended for women who have family members with BRCA-related cancers. BRCA-related cancers include breast, ovarian, tubal, and peritoneal cancers. Having family members with these cancers may be associated with an increased risk for harmful changes (mutations) in the breast cancer genes BRCA1 and  BRCA2. Results of the assessment will determine the need for genetic counseling and BRCA1 and BRCA2 testing.  Your health care provider may recommend that you be screened regularly for cancer of the pelvic organs (ovaries, uterus, and vagina). This screening involves a pelvic examination, including checking for microscopic changes to the surface of your cervix (Pap test). You may be encouraged to have this screening done every 3 years, beginning at age 21.  For women ages 30-65, health care providers may recommend pelvic exams and Pap testing every 3 years, or they may recommend the Pap and pelvic exam, combined with testing for human papilloma virus (HPV), every 5 years. Some types of HPV increase your risk of cervical cancer. Testing for HPV may also be done on women of any age with unclear Pap test results.  Other health care providers may not recommend any screening for nonpregnant women who are considered low risk for pelvic cancer and who do not have symptoms. Ask your health care provider if a screening pelvic exam is right for   you.  If you have had past treatment for cervical cancer or a condition that could lead to cancer, you need Pap tests and screening for cancer for at least 20 years after your treatment. If Pap tests have been discontinued, your risk factors (such as having a new sexual partner) need to be reassessed to determine if screening should resume. Some women have medical problems that increase the chance of getting cervical cancer. In these cases, your health care provider may recommend more frequent screening and Pap tests.  Colorectal cancer can be detected and often prevented. Most routine colorectal cancer screening begins at the age of 50 years and continues through age 75 years. However, your health care provider may recommend screening at an earlier age if you have risk factors for colon cancer. On a yearly basis, your health care provider may provide home test kits to check  for hidden blood in the stool. Use of a small camera at the end of a tube, to directly examine the colon (sigmoidoscopy or colonoscopy), can detect the earliest forms of colorectal cancer. Talk to your health care provider about this at age 50, when routine screening begins. Direct exam of the colon should be repeated every 5-10 years through age 75 years, unless early forms of precancerous polyps or small growths are found.  People who are at an increased risk for hepatitis B should be screened for this virus. You are considered at high risk for hepatitis B if:  You were born in a country where hepatitis B occurs often. Talk with your health care provider about which countries are considered high risk.  Your parents were born in a high-risk country and you have not received a shot to protect against hepatitis B (hepatitis B vaccine).  You have HIV or AIDS.  You use needles to inject street drugs.  You live with, or have sex with, someone who has hepatitis B.  You get hemodialysis treatment.  You take certain medicines for conditions like cancer, organ transplantation, and autoimmune conditions.  Hepatitis C blood testing is recommended for all people born from 1945 through 1965 and any individual with known risks for hepatitis C.  Practice safe sex. Use condoms and avoid high-risk sexual practices to reduce the spread of sexually transmitted infections (STIs). STIs include gonorrhea, chlamydia, syphilis, trichomonas, herpes, HPV, and human immunodeficiency virus (HIV). Herpes, HIV, and HPV are viral illnesses that have no cure. They can result in disability, cancer, and death.  You should be screened for sexually transmitted illnesses (STIs) including gonorrhea and chlamydia if:  You are sexually active and are younger than 24 years.  You are older than 24 years and your health care provider tells you that you are at risk for this type of infection.  Your sexual activity has changed  since you were last screened and you are at an increased risk for chlamydia or gonorrhea. Ask your health care provider if you are at risk.  If you are at risk of being infected with HIV, it is recommended that you take a prescription medicine daily to prevent HIV infection. This is called preexposure prophylaxis (PrEP). You are considered at risk if:  You are sexually active and do not regularly use condoms or know the HIV status of your partner(s).  You take drugs by injection.  You are sexually active with a partner who has HIV.  Talk with your health care provider about whether you are at high risk of being infected with HIV. If   you choose to begin PrEP, you should first be tested for HIV. You should then be tested every 3 months for as long as you are taking PrEP.  Osteoporosis is a disease in which the bones lose minerals and strength with aging. This can result in serious bone fractures or breaks. The risk of osteoporosis can be identified using a bone density scan. Women ages 67 years and over and women at risk for fractures or osteoporosis should discuss screening with their health care providers. Ask your health care provider whether you should take a calcium supplement or vitamin D to reduce the rate of osteoporosis.  Menopause can be associated with physical symptoms and risks. Hormone replacement therapy is available to decrease symptoms and risks. You should talk to your health care provider about whether hormone replacement therapy is right for you.  Use sunscreen. Apply sunscreen liberally and repeatedly throughout the day. You should seek shade when your shadow is shorter than you. Protect yourself by wearing long sleeves, pants, a wide-brimmed hat, and sunglasses year round, whenever you are outdoors.  Once a month, do a whole body skin exam, using a mirror to look at the skin on your back. Tell your health care provider of new moles, moles that have irregular borders, moles that  are larger than a pencil eraser, or moles that have changed in shape or color.  Stay current with required vaccines (immunizations).  Influenza vaccine. All adults should be immunized every year.  Tetanus, diphtheria, and acellular pertussis (Td, Tdap) vaccine. Pregnant women should receive 1 dose of Tdap vaccine during each pregnancy. The dose should be obtained regardless of the length of time since the last dose. Immunization is preferred during the 27th-36th week of gestation. An adult who has not previously received Tdap or who does not know her vaccine status should receive 1 dose of Tdap. This initial dose should be followed by tetanus and diphtheria toxoids (Td) booster doses every 10 years. Adults with an unknown or incomplete history of completing a 3-dose immunization series with Td-containing vaccines should begin or complete a primary immunization series including a Tdap dose. Adults should receive a Td booster every 10 years.  Varicella vaccine. An adult without evidence of immunity to varicella should receive 2 doses or a second dose if she has previously received 1 dose. Pregnant females who do not have evidence of immunity should receive the first dose after pregnancy. This first dose should be obtained before leaving the health care facility. The second dose should be obtained 4-8 weeks after the first dose.  Human papillomavirus (HPV) vaccine. Females aged 13-26 years who have not received the vaccine previously should obtain the 3-dose series. The vaccine is not recommended for use in pregnant females. However, pregnancy testing is not needed before receiving a dose. If a female is found to be pregnant after receiving a dose, no treatment is needed. In that case, the remaining doses should be delayed until after the pregnancy. Immunization is recommended for any person with an immunocompromised condition through the age of 61 years if she did not get any or all doses earlier. During the  3-dose series, the second dose should be obtained 4-8 weeks after the first dose. The third dose should be obtained 24 weeks after the first dose and 16 weeks after the second dose.  Zoster vaccine. One dose is recommended for adults aged 30 years or older unless certain conditions are present.  Measles, mumps, and rubella (MMR) vaccine. Adults born  before 1957 generally are considered immune to measles and mumps. Adults born in 1957 or later should have 1 or more doses of MMR vaccine unless there is a contraindication to the vaccine or there is laboratory evidence of immunity to each of the three diseases. A routine second dose of MMR vaccine should be obtained at least 28 days after the first dose for students attending postsecondary schools, health care workers, or international travelers. People who received inactivated measles vaccine or an unknown type of measles vaccine during 1963-1967 should receive 2 doses of MMR vaccine. People who received inactivated mumps vaccine or an unknown type of mumps vaccine before 1979 and are at high risk for mumps infection should consider immunization with 2 doses of MMR vaccine. For females of childbearing age, rubella immunity should be determined. If there is no evidence of immunity, females who are not pregnant should be vaccinated. If there is no evidence of immunity, females who are pregnant should delay immunization until after pregnancy. Unvaccinated health care workers born before 1957 who lack laboratory evidence of measles, mumps, or rubella immunity or laboratory confirmation of disease should consider measles and mumps immunization with 2 doses of MMR vaccine or rubella immunization with 1 dose of MMR vaccine.  Pneumococcal 13-valent conjugate (PCV13) vaccine. When indicated, a person who is uncertain of his immunization history and has no record of immunization should receive the PCV13 vaccine. All adults 65 years of age and older should receive this  vaccine. An adult aged 19 years or older who has certain medical conditions and has not been previously immunized should receive 1 dose of PCV13 vaccine. This PCV13 should be followed with a dose of pneumococcal polysaccharide (PPSV23) vaccine. Adults who are at high risk for pneumococcal disease should obtain the PPSV23 vaccine at least 8 weeks after the dose of PCV13 vaccine. Adults older than 68 years of age who have normal immune system function should obtain the PPSV23 vaccine dose at least 1 year after the dose of PCV13 vaccine.  Pneumococcal polysaccharide (PPSV23) vaccine. When PCV13 is also indicated, PCV13 should be obtained first. All adults aged 65 years and older should be immunized. An adult younger than age 65 years who has certain medical conditions should be immunized. Any person who resides in a nursing home or long-term care facility should be immunized. An adult smoker should be immunized. People with an immunocompromised condition and certain other conditions should receive both PCV13 and PPSV23 vaccines. People with human immunodeficiency virus (HIV) infection should be immunized as soon as possible after diagnosis. Immunization during chemotherapy or radiation therapy should be avoided. Routine use of PPSV23 vaccine is not recommended for American Indians, [place] Natives, or people younger than 65 years unless there are medical conditions that require PPSV23 vaccine. When indicated, people who have unknown immunization and have no record of immunization should receive PPSV23 vaccine. One-time revaccination 5 years after the first dose of PPSV23 is recommended for people aged 19-64 years who have chronic kidney failure, nephrotic syndrome, asplenia, or immunocompromised conditions. People who received 1-2 doses of PPSV23 before age 65 years should receive another dose of PPSV23 vaccine at age 65 years or later if at least 5 years have passed since the previous dose. Doses of PPSV23 are not  needed for people immunized with PPSV23 at or after age 65 years.  Meningococcal vaccine. Adults with asplenia or persistent complement component deficiencies should receive 2 doses of quadrivalent meningococcal conjugate (MenACWY-D) vaccine. The doses should be obtained   at least 2 months apart. Microbiologists working with certain meningococcal bacteria, [place] recruits, people at risk during an outbreak, and people who travel to or live in countries with a high rate of meningitis should be immunized. A first-year college student up through age 34 years who is living in a residence hall should receive a dose if she did not receive a dose on or after her 16th birthday. Adults who have certain high-risk conditions should receive one or more doses of vaccine.  Hepatitis A vaccine. Adults who wish to be protected from this disease, have certain high-risk conditions, work with hepatitis A-infected animals, work in hepatitis A research labs, or travel to or work in countries with a high rate of hepatitis A should be immunized. Adults who were previously unvaccinated and who anticipate close contact with an international adoptee during the first 60 days after arrival in the [place] from a country with a high rate of hepatitis A should be immunized.  Hepatitis B vaccine. Adults who wish to be protected from this disease, have certain high-risk conditions, may be exposed to blood or other infectious body fluids, are household contacts or sex partners of hepatitis B positive people, are clients or workers in certain care facilities, or travel to or work in countries with a high rate of hepatitis B should be immunized.  Haemophilus influenzae type b (Hib) vaccine. A previously unvaccinated person with asplenia or sickle cell disease or having a scheduled splenectomy should receive 1 dose of Hib vaccine. Regardless of previous immunization, a recipient of a hematopoietic stem cell transplant should receive a  3-dose series 6-12 months after her successful transplant. Hib vaccine is not recommended for adults with HIV infection. Preventive Services / Frequency Ages 35 to 4 years  Blood pressure check.** / Every 3-5 years.  Lipid and cholesterol check.** / Every 5 years beginning at age 60.  Clinical breast exam.** / Every 3 years for women in their 71s and 10s.  BRCA-related cancer risk assessment.** / For women who have family members with a BRCA-related cancer (breast, ovarian, tubal, or peritoneal cancers).  Pap test.** / Every 2 years from ages 76 through 26. Every 3 years starting at age 61 through age 76 or 93 with a history of 3 consecutive normal Pap tests.  HPV screening.** / Every 3 years from ages 37 through ages 60 to 51 with a history of 3 consecutive normal Pap tests.  Hepatitis C blood test.** / For any individual with known risks for hepatitis C.  Skin self-exam. / Monthly.  Influenza vaccine. / Every year.  Tetanus, diphtheria, and acellular pertussis (Tdap, Td) vaccine.** / Consult your health care provider. Pregnant women should receive 1 dose of Tdap vaccine during each pregnancy. 1 dose of Td every 10 years.  Varicella vaccine.** / Consult your health care provider. Pregnant females who do not have evidence of immunity should receive the first dose after pregnancy.  HPV vaccine. / 3 doses over 6 months, if 93 and younger. The vaccine is not recommended for use in pregnant females. However, pregnancy testing is not needed before receiving a dose.  Measles, mumps, rubella (MMR) vaccine.** / You need at least 1 dose of MMR if you were born in 1957 or later. You may also need a 2nd dose. For females of childbearing age, rubella immunity should be determined. If there is no evidence of immunity, females who are not pregnant should be vaccinated. If there is no evidence of immunity, females who are  pregnant should delay immunization until after pregnancy.  Pneumococcal  13-valent conjugate (PCV13) vaccine.** / Consult your health care provider.  Pneumococcal polysaccharide (PPSV23) vaccine.** / 1 to 2 doses if you smoke cigarettes or if you have certain conditions.  Meningococcal vaccine.** / 1 dose if you are age 68 to 8 years and a Market researcher living in a residence hall, or have one of several medical conditions, you need to get vaccinated against meningococcal disease. You may also need additional booster doses.  Hepatitis A vaccine.** / Consult your health care provider.  Hepatitis B vaccine.** / Consult your health care provider.  Haemophilus influenzae type b (Hib) vaccine.** / Consult your health care provider. Ages 7 to 53 years  Blood pressure check.** / Every year.  Lipid and cholesterol check.** / Every 5 years beginning at age 25 years.  Lung cancer screening. / Every year if you are aged 11-80 years and have a 30-pack-year history of smoking and currently smoke or have quit within the past 15 years. Yearly screening is stopped once you have quit smoking for at least 15 years or develop a health problem that would prevent you from having lung cancer treatment.  Clinical breast exam.** / Every year after age 48 years.  BRCA-related cancer risk assessment.** / For women who have family members with a BRCA-related cancer (breast, ovarian, tubal, or peritoneal cancers).  Mammogram.** / Every year beginning at age 41 years and continuing for as long as you are in good health. Consult with your health care provider.  Pap test.** / Every 3 years starting at age 65 years through age 37 or 70 years with a history of 3 consecutive normal Pap tests.  HPV screening.** / Every 3 years from ages 72 years through ages 60 to 40 years with a history of 3 consecutive normal Pap tests.  Fecal occult blood test (FOBT) of stool. / Every year beginning at age 21 years and continuing until age 5 years. You may not need to do this test if you get  a colonoscopy every 10 years.  Flexible sigmoidoscopy or colonoscopy.** / Every 5 years for a flexible sigmoidoscopy or every 10 years for a colonoscopy beginning at age 35 years and continuing until age 48 years.  Hepatitis C blood test.** / For all people born from [birth year] through 1965 and any individual with known risks for hepatitis C.  Skin self-exam. / Monthly.  Influenza vaccine. / Every year.  Tetanus, diphtheria, and acellular pertussis (Tdap/Td) vaccine.** / Consult your health care provider. Pregnant women should receive 1 dose of Tdap vaccine during each pregnancy. 1 dose of Td every 10 years.  Varicella vaccine.** / Consult your health care provider. Pregnant females who do not have evidence of immunity should receive the first dose after pregnancy.  Zoster vaccine.** / 1 dose for adults aged 30 years or older.  Measles, mumps, rubella (MMR) vaccine.** / You need at least 1 dose of MMR if you were born in 1957 or later. You may also need a second dose. For females of childbearing age, rubella immunity should be determined. If there is no evidence of immunity, females who are not pregnant should be vaccinated. If there is no evidence of immunity, females who are pregnant should delay immunization until after pregnancy.  Pneumococcal 13-valent conjugate (PCV13) vaccine.** / Consult your health care provider.  Pneumococcal polysaccharide (PPSV23) vaccine.** / 1 to 2 doses if you smoke cigarettes or if you have certain conditions.  Meningococcal vaccine.** /  Consult your health care provider.  Hepatitis A vaccine.** / Consult your health care provider.  Hepatitis B vaccine.** / Consult your health care provider.  Haemophilus influenzae type b (Hib) vaccine.** / Consult your health care provider. Ages 64 years and over  Blood pressure check.** / Every year.  Lipid and cholesterol check.** / Every 5 years beginning at age 23 years.  Lung cancer screening. / Every year if you  are aged 16-80 years and have a 30-pack-year history of smoking and currently smoke or have quit within the past 15 years. Yearly screening is stopped once you have quit smoking for at least 15 years or develop a health problem that would prevent you from having lung cancer treatment.  Clinical breast exam.** / Every year after age 74 years.  BRCA-related cancer risk assessment.** / For women who have family members with a BRCA-related cancer (breast, ovarian, tubal, or peritoneal cancers).  Mammogram.** / Every year beginning at age 44 years and continuing for as long as you are in good health. Consult with your health care provider.  Pap test.** / Every 3 years starting at age 58 years through age 22 or 39 years with 3 consecutive normal Pap tests. Testing can be stopped between 65 and 70 years with 3 consecutive normal Pap tests and no abnormal Pap or HPV tests in the past 10 years.  HPV screening.** / Every 3 years from ages 64 years through ages 70 or 61 years with a history of 3 consecutive normal Pap tests. Testing can be stopped between 65 and 70 years with 3 consecutive normal Pap tests and no abnormal Pap or HPV tests in the past 10 years.  Fecal occult blood test (FOBT) of stool. / Every year beginning at age 40 years and continuing until age 27 years. You may not need to do this test if you get a colonoscopy every 10 years.  Flexible sigmoidoscopy or colonoscopy.** / Every 5 years for a flexible sigmoidoscopy or every 10 years for a colonoscopy beginning at age 7 years and continuing until age 32 years.  Hepatitis C blood test.** / For all people born from [birth year] through 1965 and any individual with known risks for hepatitis C.  Osteoporosis screening.** / A one-time screening for women ages 30 years and over and women at risk for fractures or osteoporosis.  Skin self-exam. / Monthly.  Influenza vaccine. / Every year.  Tetanus, diphtheria, and acellular pertussis (Tdap/Td)  vaccine.** / 1 dose of Td every 10 years.  Varicella vaccine.** / Consult your health care provider.  Zoster vaccine.** / 1 dose for adults aged 35 years or older.  Pneumococcal 13-valent conjugate (PCV13) vaccine.** / Consult your health care provider.  Pneumococcal polysaccharide (PPSV23) vaccine.** / 1 dose for all adults aged 46 years and older.  Meningococcal vaccine.** / Consult your health care provider.  Hepatitis A vaccine.** / Consult your health care provider.  Hepatitis B vaccine.** / Consult your health care provider.  Haemophilus influenzae type b (Hib) vaccine.** / Consult your health care provider. ** Family history and personal history of risk and conditions may change your health care provider's recommendations.   This information is not intended to replace advice given to you by your health care provider. Make sure you discuss any questions you have with your health care provider.   Document Released: 03/14/2001 Document Revised: 02/06/2014 Document Reviewed: 06/13/2010 Elsevier Interactive Patient Education [employer].

## 2015-05-06 NOTE — Assessment & Plan Note (Signed)
Well controlled, no changes to meds. Encouraged heart healthy diet such as the DASH diet and exercise as tolerated.  °

## 2015-05-12 DIAGNOSIS — M25561 Pain in right knee: Secondary | ICD-10-CM | POA: Diagnosis not present

## 2015-05-12 DIAGNOSIS — M2550 Pain in unspecified joint: Secondary | ICD-10-CM | POA: Diagnosis not present

## 2015-05-12 DIAGNOSIS — R768 Other specified abnormal immunological findings in serum: Secondary | ICD-10-CM | POA: Diagnosis not present

## 2015-05-12 DIAGNOSIS — M25562 Pain in left knee: Secondary | ICD-10-CM | POA: Diagnosis not present

## 2015-05-23 NOTE — Assessment & Plan Note (Signed)
Encouraged to get adequate exercise, calcium and vitamin d intake 

## 2015-05-23 NOTE — Assessment & Plan Note (Signed)
Encouraged complete cessation. Discussed need to quit as relates to risk of numerous cancers, cardiac and pulmonary disease as well as neurologic complications. Counseled for greater than 3 minutes 

## 2015-06-10 ENCOUNTER — Telehealth: Payer: Self-pay | Admitting: Family Medicine

## 2015-06-10 MED ORDER — LOSARTAN POTASSIUM 50 MG PO TABS: 50.0000 mg | ORAL_TABLET | Freq: Every day | ORAL | Status: DC

## 2015-06-10 MED ORDER — ROSUVASTATIN CALCIUM 10 MG PO TABS: 10.0000 mg | ORAL_TABLET | Freq: Every day | ORAL | Status: DC

## 2015-06-10 NOTE — Telephone Encounter (Signed)
Pt refill request for 2 medications.    1. Losartan  2. Rosuvastatin   Pharmacy: [facility]

## 2015-06-10 NOTE — Telephone Encounter (Signed)
Refills done. Called the patient left a message refills done

## 2015-10-21 DIAGNOSIS — Z23 Encounter for immunization: Secondary | ICD-10-CM | POA: Diagnosis not present

## 2015-10-29 ENCOUNTER — Other Ambulatory Visit: Payer: Medicare Other

## 2015-11-02 ENCOUNTER — Other Ambulatory Visit (INDEPENDENT_AMBULATORY_CARE_PROVIDER_SITE_OTHER): Payer: Medicare Other

## 2015-11-02 DIAGNOSIS — F4320 Adjustment disorder, unspecified: Secondary | ICD-10-CM

## 2015-11-02 DIAGNOSIS — F4321 Adjustment disorder with depressed mood: Secondary | ICD-10-CM

## 2015-11-02 DIAGNOSIS — Z0000 Encounter for general adult medical examination without abnormal findings: Secondary | ICD-10-CM | POA: Diagnosis not present

## 2015-11-02 DIAGNOSIS — I10 Essential (primary) hypertension: Secondary | ICD-10-CM | POA: Diagnosis not present

## 2015-11-02 DIAGNOSIS — E785 Hyperlipidemia, unspecified: Secondary | ICD-10-CM | POA: Diagnosis not present

## 2015-11-02 LAB — CBC
HEMATOCRIT: 41.7 % (ref 36.0–46.0)
Hemoglobin: 14.1 g/dL (ref 12.0–15.0)
MCHC: 33.7 g/dL (ref 30.0–36.0)
MCV: 92.9 fl (ref 78.0–100.0)
Platelets: 401 10*3/uL — ABNORMAL HIGH (ref 150.0–400.0)
RBC: 4.49 Mil/uL (ref 3.87–5.11)
RDW: 13.5 % (ref 11.5–15.5)
WBC: 13.6 10*3/uL — ABNORMAL HIGH (ref 4.0–10.5)

## 2015-11-02 LAB — LIPID PANEL
CHOLESTEROL: 185 mg/dL (ref 0–200)
HDL: 58.5 mg/dL (ref >39.00)
LDL CALC: 93 mg/dL (ref 0–99)
NonHDL: 126.7
Total CHOL/HDL Ratio: 3
Triglycerides: 169 mg/dL — ABNORMAL HIGH (ref 0.0–149.0)
VLDL: 33.8 mg/dL (ref 0.0–40.0)

## 2015-11-02 LAB — COMPREHENSIVE METABOLIC PANEL
ALBUMIN: 4.1 g/dL (ref 3.5–5.2)
ALK PHOS: 63 U/L (ref 39–117)
ALT: 18 U/L (ref 0–35)
AST: 16 U/L (ref 0–37)
BUN: 12 mg/dL (ref 6–23)
CHLORIDE: 105 meq/L (ref 96–112)
CO2: 29 mEq/L (ref 19–32)
Calcium: 9.5 mg/dL (ref 8.4–10.5)
Creatinine, Ser: 0.72 mg/dL (ref 0.40–1.20)
GFR: 85.47 mL/min (ref >60.00)
Glucose, Bld: 101 mg/dL — ABNORMAL HIGH (ref 70–99)
POTASSIUM: 4.2 meq/L (ref 3.5–5.1)
SODIUM: 140 meq/L (ref 135–145)
TOTAL PROTEIN: 7.1 g/dL (ref 6.0–8.3)
Total Bilirubin: 0.4 mg/dL (ref 0.2–1.2)

## 2015-11-02 LAB — TSH: TSH: 1.08 u[IU]/mL (ref 0.35–4.50)

## 2015-11-05 ENCOUNTER — Encounter: Payer: Self-pay | Admitting: Family Medicine

## 2015-11-05 ENCOUNTER — Ambulatory Visit (INDEPENDENT_AMBULATORY_CARE_PROVIDER_SITE_OTHER): Payer: Medicare Other | Admitting: Family Medicine

## 2015-11-05 DIAGNOSIS — M8580 Other specified disorders of bone density and structure, unspecified site: Secondary | ICD-10-CM | POA: Diagnosis not present

## 2015-11-05 DIAGNOSIS — E785 Hyperlipidemia, unspecified: Secondary | ICD-10-CM | POA: Diagnosis not present

## 2015-11-05 DIAGNOSIS — K219 Gastro-esophageal reflux disease without esophagitis: Secondary | ICD-10-CM | POA: Diagnosis not present

## 2015-11-05 DIAGNOSIS — I10 Essential (primary) hypertension: Secondary | ICD-10-CM

## 2015-11-05 DIAGNOSIS — J209 Acute bronchitis, unspecified: Secondary | ICD-10-CM

## 2015-11-05 MED ORDER — DOXYCYCLINE HYCLATE 100 MG PO TABS
100.0000 mg | ORAL_TABLET | Freq: Two times a day (BID) | ORAL | 0 refills | Status: DC
Start: 2015-11-05 — End: 2016-11-06

## 2015-11-05 NOTE — Progress Notes (Signed)
Patient ID: Robin [name], female   DOB: 03-30-1945, 70 y.o.   MRN: [id number] Patient ID: Robin [name], female    DOB: 10-16-1945  Age: 70 y.o. MRN: [id number]    Subjective:  Subjective  HPI Robin [name] presents for follow up. She has been struggling with increased head congestion, cough, malaise, chills and headache. Before that she had been feeling well. No other recent illness or hospitalization. Denies CP/palp/SOB/HA/congestion/fevers/GI or GU c/o. Taking meds as prescribed  Review of Systems  Constitutional: Negative for fever.  HENT: Positive for congestion.   Respiratory: Positive for cough. Negative for shortness of breath.   Cardiovascular: Negative for chest pain, palpitations and leg swelling.  Gastrointestinal: Negative for abdominal pain, blood in stool and nausea.  Genitourinary: Negative for dysuria and frequency.  Skin: Negative for rash.  Allergic/Immunologic: Negative for environmental allergies.  Neurological: Negative for dizziness and headaches.  Psychiatric/Behavioral: The patient is not nervous/anxious.     History Past Medical History:  Diagnosis Date  . Acute bronchitis 11/06/2015  . Arthritis   . 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 Grief reaction 06/04/2014  . Hyperlipidemia   . Hypertension   . Medicare annual wellness visit, subsequent 06/20/2014  . Medicare annual wellness visit, subsequent 05/06/2015  . Osteopenia   . Preventative health care 06/20/2014   Medication: Review, verify sig & reconcile(including outside meds): Duplicates discarded: DM supply source:  Preferred Pharmacy and which med where:[facility] [place], [place] 90 day supply/mail order: 90 Local pharmacy: [place], [place]  Allergies verified:UTD  Immunization Status: Prompt    She has a past surgical history that includes Shoulder arthroscopy; Oophorectomy (Right); and Eye surgery (Left,  07/08/13).   Her family history includes Arthritis in her father and sister; COPD in her mother; Cancer (age of onset: 40) in her father; Heart disease in her father, paternal grandfather, and son; Hypertension in her maternal grandmother and mother; Stroke in her maternal grandmother; Urolithiasis in her sister.She reports that she has been smoking Cigarettes.  She started smoking about 32 years ago. She has a 14.50 pack-year smoking history. She has never used smokeless tobacco. She reports that she drinks about 1.8 oz of alcohol per week . She reports that she does not use drugs.  Current Outpatient Prescriptions on File Prior to Visit  Medication Sig Dispense Refill  . calcium-vitamin D (OSCAL WITH D) 500-200 MG-UNIT per tablet Take 1 tablet by mouth.    . esomeprazole (NEXIUM) 40 MG capsule Take 1 capsule (40 mg total) by mouth as needed. 90 capsule 0  . fish oil-omega-3 fatty acids 1000 MG capsule Take 1,200 mg by mouth daily.     Marland Kitchen loratadine (CLARITIN) 10 MG tablet Take 10 mg by mouth daily.    Marland Kitchen losartan (COZAAR) 50 MG tablet Take 1 tablet (50 mg total) by mouth daily. 90 tablet 2  . Multiple Vitamins-Minerals (MULTIVITAMIN WITH MINERALS) tablet Take 1 tablet by mouth daily.    . rosuvastatin (CRESTOR) 10 MG tablet Take 1 tablet (10 mg total) by mouth daily. 90 tablet 2  . valACYclovir (VALTREX) 500 MG tablet Take 1 tablet (500 mg total) by mouth 2 (two) times daily. (Patient taking differently: Take 500 mg by mouth 2 (two) times daily as needed. ) 60 tablet 1   No current facility-administered medications on file prior to visit.      Objective:  Objective  Physical  Exam  Constitutional: She is oriented to person, place, and time. She appears well-developed and well-nourished. No distress.  HENT:  Head: Normocephalic and atraumatic.  Nose: Nose normal.  Eyes: Right eye exhibits no discharge. Left eye exhibits no discharge.  Neck: Normal range of motion. Neck supple.  Cardiovascular:  Normal rate and regular rhythm.   No murmur heard. Pulmonary/Chest: Effort normal and breath sounds normal.  Abdominal: Soft. Bowel sounds are normal. There is no tenderness.  Musculoskeletal: She exhibits no edema.  Neurological: She is alert and oriented to person, place, and time.  Skin: Skin is warm and dry.  Psychiatric: She has a normal mood and affect.  Nursing note and vitals reviewed.  BP (!) 156/72 (BP Location: Left Arm, Patient Position: Sitting, Cuff Size: Normal)   Pulse 77   Temp 98.5 F (36.9 C) (Oral)   Ht 4\' 11"  (1.499 m)   Wt 130 lb 8 oz (59.2 kg)   SpO2 99%   BMI 26.36 kg/m  Wt Readings from Last 3 Encounters:  11/05/15 130 lb 8 oz (59.2 kg)  05/06/15 132 lb 8 oz (60.1 kg)  11/05/14 128 lb 6 oz (58.2 kg)     Lab Results  Component Value Date   WBC 13.6 (H) 11/02/2015   HGB 14.1 11/02/2015   HCT 41.7 11/02/2015   PLT 401.0 (H) 11/02/2015   GLUCOSE 101 (H) 11/02/2015   CHOL 185 11/02/2015   TRIG 169.0 (H) 11/02/2015   HDL 58.50 11/02/2015   LDLDIRECT 117.0 11/05/2014   LDLCALC 93 11/02/2015   ALT 18 11/02/2015   AST 16 11/02/2015   NA 140 11/02/2015   K 4.2 11/02/2015   CL 105 11/02/2015   CREATININE 0.72 11/02/2015   BUN 12 11/02/2015   CO2 29 11/02/2015   TSH 1.08 11/02/2015    No results found.   Assessment & Plan:  Plan  I have discontinued Robin [name]'s Biotin. I am also having her start on doxycycline. Additionally, I am having her maintain her fish oil-omega-3 fatty acids, loratadine, multivitamin with minerals, esomeprazole, calcium-vitamin D, valACYclovir, rosuvastatin, and losartan.  Meds ordered this encounter  Medications  . doxycycline (VIBRA-TABS) 100 MG tablet    Sig: Take 1 tablet (100 mg total) by mouth 2 (two) times daily.    Dispense:  28 tablet    Refill:  0    Problem List Items Addressed This Visit    HTN (hypertension)    Well controlled, no changes to meds. Encouraged heart healthy diet such as the DASH diet and  exercise as tolerated.       Hyperlipidemia    Tolerating statin, encouraged heart healthy diet, avoid trans fats, minimize simple carbs and saturated fats. Increase exercise as tolerated      GERD (gastroesophageal reflux disease)    Avoid offending foods, start probiotics. Do not eat large meals in late evening and consider raising head of bed.       Osteopenia    Encouraged to get adequate exercise, calcium and vitamin d intake      Acute bronchitis    Started on Doxycycline an Mucinex.        Other Visit Diagnoses   None.     Follow-up: Return in about 6 months (around 05/05/2016) for Medicare wellness and PE.  Penni Homans, MD

## 2015-11-05 NOTE — Assessment & Plan Note (Signed)
Well controlled, no changes to meds. Encouraged heart healthy diet such as the DASH diet and exercise as tolerated.  °

## 2015-11-05 NOTE — Progress Notes (Signed)
Pre visit review using our clinic review tool, if applicable. No additional management support is needed unless otherwise documented below in the visit note. 

## 2015-11-05 NOTE — Patient Instructions (Addendum)
Plain Mucinex twice daily for 10 days  Sinusitis, Adult Sinusitis is redness, soreness, and inflammation of the paranasal sinuses. Paranasal sinuses are air pockets within the bones of your face. They are located beneath your eyes, in the middle of your forehead, and above your eyes. In healthy paranasal sinuses, mucus is able to drain out, and air is able to circulate through them by way of your nose. However, when your paranasal sinuses are inflamed, mucus and air can become trapped. This can allow bacteria and other germs to grow and cause infection. Sinusitis can develop quickly and last only a short time (acute) or continue over a long period (chronic). Sinusitis that lasts for more than 12 weeks is considered chronic. CAUSES Causes of sinusitis include:  Allergies.  Structural abnormalities, such as displacement of the cartilage that separates your nostrils (deviated septum), which can decrease the air flow through your nose and sinuses and affect sinus drainage.  Functional abnormalities, such as when the small hairs (cilia) that line your sinuses and help remove mucus do not work properly or are not present. SIGNS AND SYMPTOMS Symptoms of acute and chronic sinusitis are the same. The primary symptoms are pain and pressure around the affected sinuses. Other symptoms include:  Upper toothache.  Earache.  Headache.  Bad breath.  Decreased sense of smell and taste.  A cough, which worsens when you are lying flat.  Fatigue.  Fever.  Thick drainage from your nose, which often is green and may contain pus (purulent).  Swelling and warmth over the affected sinuses. DIAGNOSIS Your health care provider will perform a physical exam. During your exam, your health care provider may perform any of the following to help determine if you have acute sinusitis or chronic sinusitis:  Look in your nose for signs of abnormal growths in your nostrils (nasal polyps).  Tap over the affected  sinus to check for signs of infection.  View the inside of your sinuses using an imaging device that has a light attached (endoscope). If your health care provider suspects that you have chronic sinusitis, one or more of the following tests may be recommended:  Allergy tests.  Nasal culture. A sample of mucus is taken from your nose, sent to a lab, and screened for bacteria.  Nasal cytology. A sample of mucus is taken from your nose and examined by your health care provider to determine if your sinusitis is related to an allergy. TREATMENT Most cases of acute sinusitis are related to a viral infection and will resolve on their own within 10 days. Sometimes, medicines are prescribed to help relieve symptoms of both acute and chronic sinusitis. These may include pain medicines, decongestants, nasal steroid sprays, or saline sprays. However, for sinusitis related to a bacterial infection, your health care provider will prescribe antibiotic medicines. These are medicines that will help kill the bacteria causing the infection. Rarely, sinusitis is caused by a fungal infection. In these cases, your health care provider will prescribe antifungal medicine. For some cases of chronic sinusitis, surgery is needed. Generally, these are cases in which sinusitis recurs more than 3 times per year, despite other treatments. HOME CARE INSTRUCTIONS  Drink plenty of water. Water helps thin the mucus so your sinuses can drain more easily.  Use a humidifier.  Inhale steam 3-4 times a day (for example, sit in the bathroom with the shower running).  Apply a warm, moist washcloth to your face 3-4 times a day, or as directed by your health care  provider.  Use saline nasal sprays to help moisten and clean your sinuses.  Take medicines only as directed by your health care provider.  If you were prescribed either an antibiotic or antifungal medicine, finish it all even if you start to feel better. SEEK IMMEDIATE  MEDICAL CARE IF:  You have increasing pain or severe headaches.  You have nausea, vomiting, or drowsiness.  You have swelling around your face.  You have vision problems.  You have a stiff neck.  You have difficulty breathing.   This information is not intended to replace advice given to you by your health care provider. Make sure you discuss any questions you have with your health care provider.   Document Released: 01/16/2005 Document Revised: 02/06/2014 Document Reviewed: 01/31/2011 Elsevier Interactive Patient Education [employer].

## 2015-11-05 NOTE — Assessment & Plan Note (Signed)
Tolerating statin, encouraged heart healthy diet, avoid trans fats, minimize simple carbs and saturated fats. Increase exercise as tolerated 

## 2015-11-05 NOTE — Assessment & Plan Note (Signed)
Avoid offending foods, start probiotics. Do not eat large meals in late evening and consider raising head of bed.  

## 2015-11-06 ENCOUNTER — Encounter: Payer: Self-pay | Admitting: Family Medicine

## 2015-11-06 DIAGNOSIS — J209 Acute bronchitis, unspecified: Secondary | ICD-10-CM

## 2015-11-06 HISTORY — DX: Acute bronchitis, unspecified: J20.9

## 2015-11-06 NOTE — Assessment & Plan Note (Signed)
Started on Doxycycline an Mucinex.

## 2015-11-06 NOTE — Assessment & Plan Note (Signed)
Encouraged to get adequate exercise, calcium and vitamin d intake 

## 2015-11-29 DIAGNOSIS — Z1212 Encounter for screening for malignant neoplasm of rectum: Secondary | ICD-10-CM | POA: Diagnosis not present

## 2015-11-29 DIAGNOSIS — Z1211 Encounter for screening for malignant neoplasm of colon: Secondary | ICD-10-CM | POA: Diagnosis not present

## 2015-11-29 LAB — COLOGUARD: COLOGUARD: NEGATIVE

## 2015-12-06 ENCOUNTER — Telehealth: Payer: Self-pay | Admitting: Family Medicine

## 2015-12-06 ENCOUNTER — Encounter: Payer: Self-pay | Admitting: Family Medicine

## 2015-12-06 NOTE — Telephone Encounter (Signed)
Cologuard Results--Negative Abstracted into chart and mailed a copy to the patient/copied original and sent to scan.

## 2016-02-01 DIAGNOSIS — Z1231 Encounter for screening mammogram for malignant neoplasm of breast: Secondary | ICD-10-CM | POA: Diagnosis not present

## 2016-02-01 LAB — HM MAMMOGRAPHY

## 2016-02-03 ENCOUNTER — Encounter: Payer: Self-pay | Admitting: Family Medicine

## 2016-02-28 ENCOUNTER — Telehealth: Payer: Self-pay | Admitting: Family Medicine

## 2016-02-28 NOTE — Telephone Encounter (Signed)
Patient called stating that she has been having pain in her right shoulder when she extends it out. She would like to get a referral to an orthopedist, possibly one within [facility].  Please advise  Phone: [phone]

## 2016-02-29 ENCOUNTER — Other Ambulatory Visit: Payer: Self-pay | Admitting: Family Medicine

## 2016-02-29 DIAGNOSIS — M25511 Pain in right shoulder: Secondary | ICD-10-CM

## 2016-02-29 NOTE — Telephone Encounter (Signed)
Spoke to the husband and informed of PCP suggestion.  Please do refer to sports medicine

## 2016-02-29 NOTE — Telephone Encounter (Signed)
I can make a referral to ortho but if she has not had a major trauma she might do just as well with a sports medicien referral and often this is a lesser copay but I will do whichever she requests.

## 2016-03-03 ENCOUNTER — Other Ambulatory Visit (INDEPENDENT_AMBULATORY_CARE_PROVIDER_SITE_OTHER): Payer: Self-pay | Admitting: Orthopaedic Surgery

## 2016-03-03 ENCOUNTER — Ambulatory Visit (INDEPENDENT_AMBULATORY_CARE_PROVIDER_SITE_OTHER): Payer: Medicare Other | Admitting: Orthopaedic Surgery

## 2016-03-03 ENCOUNTER — Ambulatory Visit (HOSPITAL_BASED_OUTPATIENT_CLINIC_OR_DEPARTMENT_OTHER)
Admission: RE | Admit: 2016-03-03 | Discharge: 2016-03-03 | Disposition: A | Discharge status: Home / Self Care | Payer: Medicare Other | Source: Ambulatory Visit | Attending: Orthopaedic Surgery | Admitting: Orthopaedic Surgery

## 2016-03-03 ENCOUNTER — Encounter (INDEPENDENT_AMBULATORY_CARE_PROVIDER_SITE_OTHER): Payer: Self-pay | Admitting: Orthopaedic Surgery

## 2016-03-03 DIAGNOSIS — M25511 Pain in right shoulder: Secondary | ICD-10-CM

## 2016-03-03 DIAGNOSIS — G8929 Other chronic pain: Secondary | ICD-10-CM | POA: Diagnosis not present

## 2016-03-03 NOTE — Progress Notes (Addendum)
Office Visit Note   Patient: Robin [name]           Date of Birth: 1945/07/06           MRN: [id number] Visit Date: 03/03/2016              Requested by: Mosie Lukes, MD [address] [place], [place] [address] PCP: Penni Homans, MD   Assessment & Plan: Visit Diagnoses:  1. Chronic right shoulder pain     Plan: patient likely has rotator cuff tear.  MRI to fully evaluate.  F/u after MRI  Follow-Up Instructions: Return in about 2 weeks (around 03/17/2016) for review MRI right shoulder.   Orders:  Orders Placed This Encounter  Procedures  . DG Shoulder Right   No orders of the defined types were placed in this encounter.     Procedures: No procedures performed   Clinical Data: No additional findings.   Subjective: Chief Complaint  Patient presents with  . Right Shoulder - Pain    71 yo female with over 6 month h/o right shoulder pain and weakness from aqua therapy.  She felt pop and has had difficulty with raising her arm and doing ADLs.  Pain radiate into upper arm.  Denies radicular pain.  Has done extensive home exercises and water aerobics without improvement.      Review of Systems  Constitutional: Negative.   HENT: Negative.   Eyes: Negative.   Respiratory: Negative.   Cardiovascular: Negative.   Endocrine: Negative.   Musculoskeletal: Negative.   Neurological: Negative.   Hematological: Negative.   Psychiatric/Behavioral: Negative.   All other systems reviewed and are negative.    Objective: Vital Signs: There were no vitals taken for this visit.  Physical Exam  Constitutional: She is oriented to person, place, and time. She appears well-developed and well-nourished.  HENT:  Head: Normocephalic and atraumatic.  Eyes: EOM are normal.  Neck: Neck supple.  Pulmonary/Chest: Effort normal.  Abdominal: Soft.  Neurological: She is alert and oriented to person, place, and time.  Skin: Skin is warm. Capillary refill takes less  than 2 seconds.  Psychiatric: She has a normal mood and affect. Her behavior is normal. Judgment and thought content normal.  Nursing note and vitals reviewed.   Right Shoulder Exam   Comments:  Normal ROM + empty can - subscap and infraspinatus - spurling's       Specialty Comments:  No specialty comments available.  Imaging: No acute findings or DJD.  Independently interpreted and reviewed.   PMFS History: Patient Active Problem List   Diagnosis Date Noted  . Chronic right shoulder pain 03/03/2016  . Acute bronchitis 11/06/2015  . Medicare annual wellness visit, subsequent 05/06/2015  . Preventative health care 06/20/2014  . Grief reaction 06/04/2014  . Screening 04/14/2013  . Leukocytosis 04/14/2013  . HTN (hypertension) 11/26/2012  . Hyperlipidemia 11/26/2012  . DJD (degenerative joint disease) 11/26/2012  . Tobacco use 11/26/2012  . GERD (gastroesophageal reflux disease) 11/26/2012  . Osteopenia 11/26/2012  . Family history of colon cancer 11/26/2012  . Allergic rhinitis 11/26/2012  . Endometriosis 11/26/2012  . Adhesive capsulitis 11/26/2012   Past Medical History:  Diagnosis Date  . Acute bronchitis 11/06/2015  . Arthritis   . 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 Grief reaction 06/04/2014  . Hyperlipidemia   . Hypertension   . Medicare annual wellness visit, subsequent 06/20/2014  . Medicare annual wellness visit, subsequent 05/06/2015  . Osteopenia   .  Preventative health care 06/20/2014   Medication: Review, verify sig & reconcile(including outside meds): Duplicates discarded: DM supply source:  Preferred Pharmacy and which med where:[facility] ([facility] [place], [place] 90 day supply/mail order: 48 Local pharmacy: [place], [place]  Allergies verified:UTD  Immunization Status: Prompt    Family History  Problem Relation Age of Onset  . Cancer Father 80    colon,  preCA?  Marland Kitchen Arthritis Father     rheumatoid arthritis  . Heart disease Father     stents  . Stroke Maternal Grandmother   . Hypertension Maternal Grandmother   . Heart disease Paternal Grandfather   . COPD Mother     smoker  . Hypertension Mother   . Arthritis Sister   . Urolithiasis Sister   . Heart disease Son     Past Surgical History:  Procedure Laterality Date  . EYE SURGERY Left 07/08/13  . OOPHORECTOMY Right   . SHOULDER ARTHROSCOPY     Social History   Occupational History  . Not on file.   Social History Main Topics  . Smoking status: Current Every Day Smoker    Packs/day: 0.50    Years: 29.00    Types: Cigarettes    Start date: 01/03/1983  . Smokeless tobacco: Never Used     Comment: 06-02-13  still smoking  . Alcohol use 1.8 oz/week    3 Glasses of wine per week     Comment: 3 glasses daily  . Drug use: No  . Sexual activity: No     Comment: no dietary restrictions, smokes, lives with husband

## 2016-03-03 NOTE — Addendum Note (Signed)
Addended by: Precious Bard on: 03/03/2016 10:27 AM   Modules accepted: Orders

## 2016-03-04 ENCOUNTER — Ambulatory Visit (HOSPITAL_BASED_OUTPATIENT_CLINIC_OR_DEPARTMENT_OTHER)
Admission: RE | Admit: 2016-03-04 | Discharge: 2016-03-04 | Disposition: A | Discharge status: Home / Self Care | Payer: Medicare Other | Source: Ambulatory Visit | Attending: Orthopaedic Surgery | Admitting: Orthopaedic Surgery

## 2016-03-04 DIAGNOSIS — M7551 Bursitis of right shoulder: Secondary | ICD-10-CM | POA: Insufficient documentation

## 2016-03-04 DIAGNOSIS — G8929 Other chronic pain: Secondary | ICD-10-CM | POA: Diagnosis not present

## 2016-03-04 DIAGNOSIS — M75101 Unspecified rotator cuff tear or rupture of right shoulder, not specified as traumatic: Secondary | ICD-10-CM | POA: Diagnosis not present

## 2016-03-04 DIAGNOSIS — M25511 Pain in right shoulder: Secondary | ICD-10-CM | POA: Diagnosis not present

## 2016-03-14 ENCOUNTER — Ambulatory Visit (INDEPENDENT_AMBULATORY_CARE_PROVIDER_SITE_OTHER): Payer: Self-pay

## 2016-03-14 ENCOUNTER — Ambulatory Visit (INDEPENDENT_AMBULATORY_CARE_PROVIDER_SITE_OTHER): Payer: Medicare Other | Admitting: Orthopaedic Surgery

## 2016-03-14 DIAGNOSIS — G8929 Other chronic pain: Secondary | ICD-10-CM

## 2016-03-14 DIAGNOSIS — M25511 Pain in right shoulder: Secondary | ICD-10-CM | POA: Diagnosis not present

## 2016-03-14 MED ORDER — BUPIVACAINE HCL 0.5 % IJ SOLN
3.0000 mL | INTRAMUSCULAR | Status: AC | PRN
  Administered 2016-03-14: 3 mL via INTRA_ARTICULAR

## 2016-03-14 MED ORDER — TRIAMCINOLONE ACETONIDE 40 MG/ML IJ SUSP
80.0000 mg | INTRAMUSCULAR | Status: AC | PRN
  Administered 2016-03-14: 80 mg via INTRA_ARTICULAR

## 2016-03-14 NOTE — Progress Notes (Signed)
Office Visit Note   Patient: Robin [name]           Date of Birth: 1945/08/09           MRN: [id number] Visit Date: 03/14/2016              Requested by: Mosie Lukes, MD [address] [place], [place] [address] PCP: Penni Homans, MD   Assessment & Plan: Visit Diagnoses:  1. Chronic right shoulder pain     Plan: MRI shows RC tendinosis, SLAP tear.  Intraarticular injection performed by Dr. Ernestina Patches today.  F/u 4 weeks for recheck.  Follow-Up Instructions: No Follow-up on file.   Orders:  Orders Placed This Encounter  Procedures  . Large Joint Injection/Arthrocentesis  . XR C-ARM NO REPORT   No orders of the defined types were placed in this encounter.     Procedures: No procedures performed   Clinical Data: No additional findings.   Subjective: No chief complaint on file.   Patient f/u today for right shoulder MRI.  Continues to have pain with elevation of arm in front of body    Review of Systems  Constitutional: Negative.   HENT: Negative.   Eyes: Negative.   Respiratory: Negative.   Cardiovascular: Negative.   Endocrine: Negative.   Musculoskeletal: Negative.   Neurological: Negative.   Hematological: Negative.   Psychiatric/Behavioral: Negative.   All other systems reviewed and are negative.    Objective: Vital Signs: There were no vitals taken for this visit.  Physical Exam  Constitutional: She is oriented to person, place, and time. She appears well-developed and well-nourished.  Pulmonary/Chest: Effort normal.  Neurological: She is alert and oriented to person, place, and time.  Skin: Skin is warm. Capillary refill takes less than 2 seconds.  Psychiatric: She has a normal mood and affect. Her behavior is normal. Judgment and thought content normal.  Nursing note and vitals reviewed.   Ortho Exam Right shoulder - positive OBrien's - neg empty can  Specialty Comments:  No specialty comments available.  Imaging: Xr  C-arm No Report  Result Date: 03/14/2016 Please see Notes or Procedures tab for imaging impression.    PMFS History: Patient Active Problem List   Diagnosis Date Noted  . Chronic right shoulder pain 03/03/2016  . Acute bronchitis 11/06/2015  . Medicare annual wellness visit, subsequent 05/06/2015  . Preventative health care 06/20/2014  . Grief reaction 06/04/2014  . Screening 04/14/2013  . Leukocytosis 04/14/2013  . HTN (hypertension) 11/26/2012  . Hyperlipidemia 11/26/2012  . DJD (degenerative joint disease) 11/26/2012  . Tobacco use 11/26/2012  . GERD (gastroesophageal reflux disease) 11/26/2012  . Osteopenia 11/26/2012  . Family history of colon cancer 11/26/2012  . Allergic rhinitis 11/26/2012  . Endometriosis 11/26/2012  . Adhesive capsulitis 11/26/2012   Past Medical History:  Diagnosis Date  . Acute bronchitis 11/06/2015  . Arthritis   . 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 Grief reaction 06/04/2014  . Hyperlipidemia   . Hypertension   . Medicare annual wellness visit, subsequent 06/20/2014  . Medicare annual wellness visit, subsequent 05/06/2015  . Osteopenia   . Preventative health care 06/20/2014   Medication: Review, verify sig & reconcile(including outside meds): Duplicates discarded: DM supply source:  Preferred Pharmacy and which med where:[facility] [place], [place] 90 day supply/mail order: 90 Local pharmacy: [facility]  Allergies verified:UTD  Immunization Status: Prompt    Family History  Problem Relation Age of Onset  . Cancer Father 62    colon, preCA?  Marland Kitchen Arthritis Father     rheumatoid arthritis  . Heart disease Father     stents  . Stroke Maternal Grandmother   . Hypertension Maternal Grandmother   . Heart disease Paternal Grandfather   . COPD Mother     smoker  . Hypertension Mother   . Arthritis Sister   . Urolithiasis Sister   . Heart  disease Son     Past Surgical History:  Procedure Laterality Date  . EYE SURGERY Left 07/08/13  . OOPHORECTOMY Right   . SHOULDER ARTHROSCOPY     Social History   Occupational History  . Not on file.   Social History Main Topics  . Smoking status: Current Every Day Smoker    Packs/day: 0.50    Years: 29.00    Types: Cigarettes    Start date: 01/03/1983  . Smokeless tobacco: Never Used     Comment: 06-02-13  still smoking  . Alcohol use 1.8 oz/week    3 Glasses of wine per week     Comment: 3 glasses daily  . Drug use: No  . Sexual activity: No     Comment: no dietary restrictions, smokes, lives with husband

## 2016-03-14 NOTE — Progress Notes (Signed)
Robin [name] - 71 y.o. female MRN [id number]  Date of birth: [birth year]  Office Visit Note: Visit Date: 03/14/2016 PCP: Robin Homans, MD Referred by: Robin Lukes, MD  Subjective: No chief complaint on file.  HPI: Robin [name] is a 71 year old female who is having chronic worsening severe right shoulder pain. She saw Robin [name] today for evaluation and management request diagnostic and hopefully therapeutic anesthetic arthrogram of the glenohumeral joint. MRI of the right shoulder shoulder adhesive capsulitis with some tendinopathy of the rotator cuff as well as subacromial bursitis.    ROS Otherwise per HPI.  Assessment & Plan: Visit Diagnoses:  1. Chronic right shoulder pain     Plan: Findings:  Right anesthetic arthrogram of the glenohumeral joint.    Meds & Orders: No orders of the defined types were placed in this encounter.   Orders Placed This Encounter  Procedures  . Large Joint Injection/Arthrocentesis  . XR C-ARM NO REPORT    Follow-up: No Follow-up on file.   Procedures: Large Joint Inj Date/Time: 03/14/2016 4:06 PM Performed by: Robin [name] Authorized by: Robin [name]   Consent Given by:  Patient Site marked: the procedure site was marked   Timeout: prior to procedure the correct patient, procedure, and site was verified   Indications:  Pain and diagnostic evaluation Location:  Shoulder Site:  R glenohumeral Prep: patient was prepped and draped in usual sterile fashion   Needle Size:  22 G Needle Length:  3.5 inches Approach:  Anteromedial Ultrasound Guidance: No   Fluoroscopic Guidance: No   Arthrogram: Yes   Medications:  80 mg triamcinolone acetonide 40 MG/ML; 3 mL bupivacaine 0.5 % Aspiration Attempted: No   Patient tolerance:  Patient tolerated the procedure well with no immediate complications  Arthrogram demonstrated excellent flow of contrast throughout the joint surface without extravasation or obvious defect.  The patient had relief  of symptoms during the anesthetic phase of the injection.     No notes on file   Clinical History: No specialty comments available.  She reports that she has been smoking Cigarettes.  She started smoking about 33 years ago. She has a 14.50 pack-year smoking history. She has never used smokeless tobacco. No results for input(s): HGBA1C, LABURIC in the last 8760 hours.  Objective:  VS:  HT:    WT:   BMI:     BP:   HR: bpm  TEMP: ( )  RESP:  Physical Exam  Musculoskeletal:  Shoulder is painful with extension and abduction. She lacks significant motion with abduction.    Ortho Exam Imaging: No results found.  Past Medical/Family/Surgical/Social History: Medications & Allergies reviewed per EMR Patient Active Problem List   Diagnosis Date Noted  . Chronic right shoulder pain 03/03/2016  . Acute bronchitis 11/06/2015  . Medicare annual wellness visit, subsequent 05/06/2015  . Preventative health care 06/20/2014  . Grief reaction 06/04/2014  . Screening 04/14/2013  . Leukocytosis 04/14/2013  . HTN (hypertension) 11/26/2012  . Hyperlipidemia 11/26/2012  . DJD (degenerative joint disease) 11/26/2012  . Tobacco use 11/26/2012  . GERD (gastroesophageal reflux disease) 11/26/2012  . Osteopenia 11/26/2012  . Family history of colon cancer 11/26/2012  . Allergic rhinitis 11/26/2012  . Endometriosis 11/26/2012  . Adhesive capsulitis 11/26/2012   Past Medical History:  Diagnosis Date  . Acute bronchitis 11/06/2015  . Arthritis   . 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 Grief reaction 06/04/2014  . Hyperlipidemia   . Hypertension   . Medicare annual wellness visit, subsequent  06/20/2014  . Medicare annual wellness visit, subsequent 05/06/2015  . Osteopenia   . Preventative health care 06/20/2014   Medication: Review, verify sig & reconcile(including outside meds): Duplicates discarded: DM supply source:  Preferred Pharmacy and which med where:[facility] ([facility] [place], [place] 90 day supply/mail order: 23 Local pharmacy: [facility], [place] [facility] [place]  Allergies verified:UTD  Immunization Status: Prompt   Family History  Problem Relation Age of Onset  . Cancer Father 69    colon, preCA?  Marland Kitchen Arthritis Father     rheumatoid arthritis  . Heart disease Father     stents  . Stroke Maternal Grandmother   . Hypertension Maternal Grandmother   . Heart disease Paternal Grandfather   . COPD Mother     smoker  . Hypertension Mother   . Arthritis Sister   . Urolithiasis Sister   . Heart disease Son    Past Surgical History:  Procedure Laterality Date  . EYE SURGERY Left 07/08/13  . OOPHORECTOMY Right   . SHOULDER ARTHROSCOPY     Social History   Occupational History  . Not on file.   Social History Main Topics  . Smoking status: Current Every Day Smoker    Packs/day: 0.50    Years: 29.00    Types: Cigarettes    Start date: 01/03/1983  . Smokeless tobacco: Never Used     Comment: 06-02-13  still smoking  . Alcohol use 1.8 oz/week    3 Glasses of wine per week     Comment: 3 glasses daily  . Drug use: No  . Sexual activity: No     Comment: no dietary restrictions, smokes, lives with husband

## 2016-04-07 ENCOUNTER — Ambulatory Visit (INDEPENDENT_AMBULATORY_CARE_PROVIDER_SITE_OTHER): Payer: Medicare Other | Admitting: Orthopaedic Surgery

## 2016-04-11 ENCOUNTER — Ambulatory Visit (INDEPENDENT_AMBULATORY_CARE_PROVIDER_SITE_OTHER): Payer: Medicare Other | Admitting: Orthopaedic Surgery

## 2016-04-17 ENCOUNTER — Encounter (INDEPENDENT_AMBULATORY_CARE_PROVIDER_SITE_OTHER): Payer: Self-pay | Admitting: Orthopaedic Surgery

## 2016-04-17 ENCOUNTER — Ambulatory Visit (INDEPENDENT_AMBULATORY_CARE_PROVIDER_SITE_OTHER): Payer: Medicare Other | Admitting: Orthopaedic Surgery

## 2016-04-17 DIAGNOSIS — G8929 Other chronic pain: Secondary | ICD-10-CM | POA: Diagnosis not present

## 2016-04-17 DIAGNOSIS — M25511 Pain in right shoulder: Secondary | ICD-10-CM

## 2016-04-17 NOTE — Progress Notes (Signed)
Office Visit Note   Patient: Robin [name]           Date of Birth: 1945-07-06           MRN: [id number] Visit Date: 04/17/2016              Requested by: Mosie Lukes, MD [address] [place], [place] [address] PCP: Penni Homans, MD   Assessment & Plan: Visit Diagnoses:  1. Chronic right shoulder pain     Plan: Patient has responded very well to her injection. We generally outlined activities that she should avoid. I will see her back as needed.  Follow-Up Instructions: Return if symptoms worsen or fail to improve.   Orders:  No orders of the defined types were placed in this encounter.  No orders of the defined types were placed in this encounter.     Procedures: No procedures performed   Clinical Data: No additional findings.   Subjective: Chief Complaint  Patient presents with  . Right Shoulder - Pain, Follow-up    Patient comes back today for right shoulder pain. She had an injection on 03/14/2016 and she is 99% better. She is very happy.    Review of Systems   Objective: Vital Signs: There were no vitals taken for this visit.  Physical Exam  Ortho Exam Exam of the right shoulder is stable and benign. Specialty Comments:  No specialty comments available.  Imaging: No results found.   PMFS History: Patient Active Problem List   Diagnosis Date Noted  . Chronic right shoulder pain 03/03/2016  . Acute bronchitis 11/06/2015  . Medicare annual wellness visit, subsequent 05/06/2015  . Preventative health care 06/20/2014  . Grief reaction 06/04/2014  . Screening 04/14/2013  . Leukocytosis 04/14/2013  . HTN (hypertension) 11/26/2012  . Hyperlipidemia 11/26/2012  . DJD (degenerative joint disease) 11/26/2012  . Tobacco use 11/26/2012  . GERD (gastroesophageal reflux disease) 11/26/2012  . Osteopenia 11/26/2012  . Family history of colon cancer 11/26/2012  . Allergic rhinitis 11/26/2012  . Endometriosis 11/26/2012  . Adhesive  capsulitis 11/26/2012   Past Medical History:  Diagnosis Date  . Acute bronchitis 11/06/2015  . Arthritis   . 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 Grief reaction 06/04/2014  . Hyperlipidemia   . Hypertension   . Medicare annual wellness visit, subsequent 06/20/2014  . Medicare annual wellness visit, subsequent 05/06/2015  . Osteopenia   . Preventative health care 06/20/2014   Medication: Review, verify sig & reconcile(including outside meds): Duplicates discarded: DM supply source:  Preferred Pharmacy and which med where:[facility] ([facility]) [place], [place] 90 day supply/mail order: 67 Local pharmacy: [facility], [place] - [place]  Allergies verified:UTD  Immunization Status: Prompt    Family History  Problem Relation Age of Onset  . Cancer Father 67    colon, preCA?  Marland Kitchen Arthritis Father     rheumatoid arthritis  . Heart disease Father     stents  . Stroke Maternal Grandmother   . Hypertension Maternal Grandmother   . Heart disease Paternal Grandfather   . COPD Mother     smoker  . Hypertension Mother   . Arthritis Sister   . Urolithiasis Sister   . Heart disease Son     Past Surgical History:  Procedure Laterality Date  . EYE SURGERY Left 07/08/13  . OOPHORECTOMY Right   . SHOULDER ARTHROSCOPY  Social History   Occupational History  . Not on file.   Social History Main Topics  . Smoking status: Current Every Day Smoker    Packs/day: 0.50    Years: 29.00    Types: Cigarettes    Start date: 01/03/1983  . Smokeless tobacco: Never Used     Comment: 06-02-13  still smoking  . Alcohol use 1.8 oz/week    3 Glasses of wine per week     Comment: 3 glasses daily  . Drug use: No  . Sexual activity: No     Comment: no dietary restrictions, smokes, lives with husband

## 2016-05-08 ENCOUNTER — Ambulatory Visit (INDEPENDENT_AMBULATORY_CARE_PROVIDER_SITE_OTHER): Payer: Medicare Other | Admitting: Family Medicine

## 2016-05-08 ENCOUNTER — Encounter: Payer: Self-pay | Admitting: Family Medicine

## 2016-05-08 VITALS — BP 128/71 | HR 77 | Temp 98.2°F | Wt 129.8 lb

## 2016-05-08 DIAGNOSIS — E2839 Other primary ovarian failure: Secondary | ICD-10-CM | POA: Diagnosis not present

## 2016-05-08 DIAGNOSIS — E785 Hyperlipidemia, unspecified: Secondary | ICD-10-CM

## 2016-05-08 DIAGNOSIS — K219 Gastro-esophageal reflux disease without esophagitis: Secondary | ICD-10-CM

## 2016-05-08 DIAGNOSIS — M8580 Other specified disorders of bone density and structure, unspecified site: Secondary | ICD-10-CM | POA: Diagnosis not present

## 2016-05-08 DIAGNOSIS — Z0000 Encounter for general adult medical examination without abnormal findings: Secondary | ICD-10-CM | POA: Diagnosis not present

## 2016-05-08 DIAGNOSIS — I10 Essential (primary) hypertension: Secondary | ICD-10-CM | POA: Diagnosis not present

## 2016-05-08 DIAGNOSIS — M75111 Incomplete rotator cuff tear or rupture of right shoulder, not specified as traumatic: Secondary | ICD-10-CM

## 2016-05-08 DIAGNOSIS — Z720 Tobacco use: Secondary | ICD-10-CM

## 2016-05-08 LAB — COMPREHENSIVE METABOLIC PANEL
ALBUMIN: 4.6 g/dL (ref 3.5–5.2)
ALT: 16 U/L (ref 0–35)
AST: 16 U/L (ref 0–37)
Alkaline Phosphatase: 64 U/L (ref 39–117)
BUN: 14 mg/dL (ref 6–23)
CALCIUM: 10.5 mg/dL (ref 8.4–10.5)
CHLORIDE: 107 meq/L (ref 96–112)
CO2: 27 meq/L (ref 19–32)
Creatinine, Ser: 0.84 mg/dL (ref 0.40–1.20)
GFR: 71.43 mL/min (ref >60.00)
Glucose, Bld: 106 mg/dL — ABNORMAL HIGH (ref 70–99)
Potassium: 4.9 mEq/L (ref 3.5–5.1)
Sodium: 138 mEq/L (ref 135–145)
Total Bilirubin: 0.5 mg/dL (ref 0.2–1.2)
Total Protein: 7.4 g/dL (ref 6.0–8.3)

## 2016-05-08 LAB — CBC
HEMATOCRIT: 42.8 % (ref 36.0–46.0)
HEMOGLOBIN: 14.2 g/dL (ref 12.0–15.0)
MCHC: 33.1 g/dL (ref 30.0–36.0)
MCV: 95.1 fl (ref 78.0–100.0)
Platelets: 376 10*3/uL (ref 150.0–400.0)
RBC: 4.5 Mil/uL (ref 3.87–5.11)
RDW: 14.4 % (ref 11.5–15.5)
WBC: 12.2 10*3/uL — AB (ref 4.0–10.5)

## 2016-05-08 LAB — LIPID PANEL
CHOL/HDL RATIO: 4
Cholesterol: 232 mg/dL — ABNORMAL HIGH (ref 0–200)
HDL: 65.7 mg/dL (ref >39.00)
NONHDL: 166.62
TRIGLYCERIDES: 212 mg/dL — AB (ref 0.0–149.0)
VLDL: 42.4 mg/dL — ABNORMAL HIGH (ref 0.0–40.0)

## 2016-05-08 LAB — TSH: TSH: 1.42 u[IU]/mL (ref 0.35–4.50)

## 2016-05-08 LAB — LDL CHOLESTEROL, DIRECT: Direct LDL: 135 mg/dL

## 2016-05-08 MED ORDER — ROSUVASTATIN CALCIUM 10 MG PO TABS: 10.0000 mg | ORAL_TABLET | Freq: Every day | ORAL | 1 refills | Status: DC

## 2016-05-08 MED ORDER — ESOMEPRAZOLE MAGNESIUM 40 MG PO CPDR: 40.0000 mg | DELAYED_RELEASE_CAPSULE | ORAL | 1 refills | Status: DC | PRN

## 2016-05-08 MED ORDER — LOSARTAN POTASSIUM 50 MG PO TABS: 50.0000 mg | ORAL_TABLET | Freq: Every day | ORAL | 1 refills | Status: DC

## 2016-05-08 MED ORDER — LORATADINE 10 MG PO TABS: 10.0000 mg | ORAL_TABLET | Freq: Every day | ORAL | 1 refills | Status: DC

## 2016-05-08 NOTE — Assessment & Plan Note (Addendum)
Encouraged ongoing complete cessation. Discussed need to quit as relates to risk of numerous cancers, cardiac and pulmonary disease as well as neurologic complications. Counseled for greater than 3 minutes. Last cigarette at Christmas, prior to that was October

## 2016-05-08 NOTE — Assessment & Plan Note (Signed)
Patient encouraged to maintain heart healthy diet, regular exercise, adequate sleep. Consider daily probiotics. Take medications as prescribed 

## 2016-05-08 NOTE — Patient Instructions (Signed)
Preventive Care 69 Years and Older, Female Preventive care refers to lifestyle choices and visits with your health care provider that can promote health and wellness. What does preventive care include?  A yearly physical exam. This is also called an annual well check.  Dental exams once or twice a year.  Routine eye exams. Ask your health care provider how often you should have your eyes checked.  Personal lifestyle choices, including:  Daily care of your teeth and gums.  Regular physical activity.  Eating a healthy diet.  Avoiding tobacco and drug use.  Limiting alcohol use.  Practicing safe sex.  Taking low-dose aspirin every day.  Taking vitamin and mineral supplements as recommended by your health care provider. What happens during an annual well check? The services and screenings done by your health care provider during your annual well check will depend on your age, overall health, lifestyle risk factors, and family history of disease. Counseling  Your health care provider may ask you questions about your:  Alcohol use.  Tobacco use.  Drug use.  Emotional well-being.  Home and relationship well-being.  Sexual activity.  Eating habits.  History of falls.  Memory and ability to understand (cognition).  Work and work environment.  Reproductive health. Screening  You may have the following tests or measurements:  Height, weight, and BMI.  Blood pressure.  Lipid and cholesterol levels. These may be checked every 5 years, or more frequently if you are over 50 years old.  Skin check.  Lung cancer screening. You may have this screening every year starting at age 55 if you have a 30-pack-year history of smoking and currently smoke or have quit within the past 15 years.  Fecal occult blood test (FOBT) of the stool. You may have this test every year starting at age 50.  Flexible sigmoidoscopy or colonoscopy. You may have a sigmoidoscopy every 5 years or  a colonoscopy every 10 years starting at age 50.  Hepatitis C blood test.  Hepatitis B blood test.  Sexually transmitted disease (STD) testing.  Diabetes screening. This is done by checking your blood sugar (glucose) after you have not eaten for a while (fasting). You may have this done every 1-3 years.  Bone density scan. This is done to screen for osteoporosis. You may have this done starting at age 69.  Mammogram. This may be done every 1-2 years. Talk to your health care provider about how often you should have regular mammograms. Talk with your health care provider about your test results, treatment options, and if necessary, the need for more tests. Vaccines  Your health care provider may recommend certain vaccines, such as:  Influenza vaccine. This is recommended every year.  Tetanus, diphtheria, and acellular pertussis (Tdap, Td) vaccine. You may need a Td booster every 10 years.  Varicella vaccine. You may need this if you have not been vaccinated.  Zoster vaccine. You may need this after age 60.  Measles, mumps, and rubella (MMR) vaccine. You may need at least one dose of MMR if you were born in 1957 or later. You may also need a second dose.  Pneumococcal 13-valent conjugate (PCV13) vaccine. One dose is recommended after age 69.  Pneumococcal polysaccharide (PPSV23) vaccine. One dose is recommended after age 69.  Meningococcal vaccine. You may need this if you have certain conditions.  Hepatitis A vaccine. You may need this if you have certain conditions or if you travel or work in places where you may be exposed to   hepatitis A.  Hepatitis B vaccine. You may need this if you have certain conditions or if you travel or work in places where you may be exposed to hepatitis B.  Haemophilus influenzae type b (Hib) vaccine. You may need this if you have certain conditions. Talk to your health care provider about which screenings and vaccines you need and how often you need  them. This information is not intended to replace advice given to you by your health care provider. Make sure you discuss any questions you have with your health care provider. Document Released: 02/12/2015 Document Revised: 10/06/2015 Document Reviewed: 11/17/2014 Elsevier Interactive Patient Education  2017 [employer]  

## 2016-05-08 NOTE — Progress Notes (Signed)
Patient ID: Robin [name], female   DOB: [birth year], 71 y.o.   MRN: [id number]   Subjective:  I acted as a Education administrator for Penni Homans, MD. [place], [place]   Patient ID: Robin [name], female    DOB: [birth year], 71 y.o.   MRN: [id number]  Chief Complaint  Patient presents with  . Annual Exam  . Hypertension  . Gastroesophageal Reflux  . Hyperlipidemia    Hypertension  This is a chronic problem. The problem is controlled. Pertinent negatives include no blurred vision, chest pain, headaches, malaise/fatigue, palpitations or shortness of breath.  Gastroesophageal Reflux  She reports no chest pain or no coughing. This is a chronic problem.  Hyperlipidemia  This is a chronic problem. The problem is controlled. Pertinent negatives include no chest pain or shortness of breath.    Patient is in today for an annual examination. Patient states the she has a torn tendon and torn rotator cuff for which is being cared for by another Provider. Also states that she hasn't had a recent Bone Density Test. Patient has a Hx of HTN, osteopenia, hyperlipidemia, GERD. Patient has no additional acute concerns noted at this time.  Patient Care Team: Mosie Lukes, MD as PCP - General (Family Medicine) Mercy Moore, OD as Consulting Physician (Optometry) Laurence Spates, MD as Consulting Physician (Gastroenterology) Bjorn Pippin, PA-C as Physician Assistant (Emergency Medicine)   Past Medical History:  Diagnosis Date  . Acute bronchitis 11/06/2015  . Arthritis   . 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 Grief reaction 06/04/2014  . Hyperlipidemia   . Hypertension   . Medicare annual wellness visit, subsequent 06/20/2014  . Medicare annual wellness visit, subsequent 05/06/2015  . Osteopenia   . Preventative health care 06/20/2014   Medication: Review, verify sig & reconcile(including outside meds): Duplicates discarded: DM supply source:  Preferred Pharmacy and which med where:[facility] (MAIL ORDER) [place],  [place] 90 day supply/mail order: 90 Local pharmacy: [place], [place]  Allergies verified:UTD  Immunization Status: Prompt  . Rotator cuff tear, right 11/26/2012    Past Surgical History:  Procedure Laterality Date  . EYE SURGERY Left 07/08/13  . OOPHORECTOMY Right   . SHOULDER ARTHROSCOPY      Family History  Problem Relation Age of Onset  . Cancer Father 105    colon, preCA?  Marland Kitchen Arthritis Father     rheumatoid arthritis  . Heart disease Father     stents  . Stroke Maternal Grandmother   . Hypertension Maternal Grandmother   . Heart disease Paternal Grandfather   . COPD Mother     smoker  . Hypertension Mother   . Arthritis Sister   . Urolithiasis Sister   . Heart disease Son     Social History   Social History  . Marital status: Married    Spouse name: N/A  . Number of children: N/A  . Years of education: N/A   Occupational History  . Not on file.   Social History Main Topics  . Smoking status: Current Every Day Smoker    Packs/day: 0.50    Years: 29.00    Types: Cigarettes    Start date: 01/03/1983  . Smokeless tobacco: Never Used     Comment: 06-02-13  still smoking  . Alcohol use 1.8 oz/week    3 Glasses of wine per week     Comment: 3 glasses daily  .  Drug use: No  . Sexual activity: No     Comment: no dietary restrictions, smokes, lives with husband    Other Topics Concern  . Not on file   Social History Narrative  . No narrative on file    Outpatient Medications Prior to Visit  Medication Sig Dispense Refill  . calcium-vitamin D (OSCAL WITH D) 500-200 MG-UNIT per tablet Take 1 tablet by mouth.    . doxycycline (VIBRA-TABS) 100 MG tablet Take 1 tablet (100 mg total) by mouth 2 (two) times daily. 28 tablet 0  . fish oil-omega-3 fatty acids 1000 MG capsule Take 1,200 mg by mouth daily.     . Multiple Vitamins-Minerals (MULTIVITAMIN WITH MINERALS) tablet Take 1 tablet by  mouth daily.    . valACYclovir (VALTREX) 500 MG tablet Take 1 tablet (500 mg total) by mouth 2 (two) times daily. (Patient taking differently: Take 500 mg by mouth 2 (two) times daily as needed. ) 60 tablet 1  . esomeprazole (NEXIUM) 40 MG capsule Take 1 capsule (40 mg total) by mouth as needed. 90 capsule 0  . loratadine (CLARITIN) 10 MG tablet Take 10 mg by mouth daily.    Marland Kitchen losartan (COZAAR) 50 MG tablet Take 1 tablet (50 mg total) by mouth daily. 90 tablet 2  . rosuvastatin (CRESTOR) 10 MG tablet Take 1 tablet (10 mg total) by mouth daily. 90 tablet 2   No facility-administered medications prior to visit.     Allergies  Allergen Reactions  . Pravastatin Anaphylaxis    myalgias  . Codeine Other (See Comments)    Review of Systems  Constitutional: Negative for fever and malaise/fatigue.  HENT: Negative for congestion.   Eyes: Negative for blurred vision.  Respiratory: Negative for cough and shortness of breath.   Cardiovascular: Negative for chest pain, palpitations and leg swelling.  Gastrointestinal: Negative for vomiting.  Musculoskeletal: Positive for joint pain. Negative for back pain.  Skin: Negative for rash.  Neurological: Negative for loss of consciousness and headaches.       Objective:    Physical Exam  Constitutional: She is oriented to person, place, and time. She appears well-developed and well-nourished. No distress.  HENT:  Head: Normocephalic and atraumatic.  Eyes: Conjunctivae are normal.  Neck: Normal range of motion. Neck supple. No thyromegaly present.  Cardiovascular: Normal rate, regular rhythm and normal heart sounds.   No murmur heard. Pulmonary/Chest: Effort normal and breath sounds normal. No respiratory distress. She has no wheezes.  Abdominal: Soft. Bowel sounds are normal. She exhibits no distension and no mass. There is no tenderness.  Musculoskeletal: She exhibits no edema or deformity.  Lymphadenopathy:    She has no cervical adenopathy.    Neurological: She is alert and oriented to person, place, and time.  Skin: Skin is warm and dry. She is not diaphoretic.  Psychiatric: She has a normal mood and affect. Her behavior is normal.    BP 128/71 (BP Location: Left Arm, Patient Position: Sitting, Cuff Size: Normal)   Pulse 77   Temp 98.2 F (36.8 C) (Oral)   Wt 129 lb 12.8 oz (58.9 kg)   SpO2 100% Comment: RA  BMI 26.22 kg/m  Wt Readings from Last 3 Encounters:  05/08/16 129 lb 12.8 oz (58.9 kg)  11/05/15 130 lb 8 oz (59.2 kg)  05/06/15 132 lb 8 oz (60.1 kg)   BP Readings from Last 3 Encounters:  05/08/16 128/71  11/05/15 (!) 156/72  05/06/15 120/82     Immunization History  Administered Date(s) Administered  . Influenza,inj,Quad PF,36+ Mos 11/26/2012, 12/01/2013, 11/05/2014, 12/09/2014  . Influenza-Unspecified 10/21/2015  . Pneumococcal Polysaccharide-23 01/06/2013    Health Maintenance  Topic Date Due  . Hepatitis C Screening  1945-10-30  . PNA vac Low Risk Adult (2 of 2 - PCV13) 01/06/2014  . INFLUENZA VACCINE  08/30/2016  . MAMMOGRAM  01/31/2018  . TETANUS/TDAP  06/04/2021  . COLONOSCOPY  02/05/2022  . DEXA SCAN  Completed    Lab Results  Component Value Date   WBC 13.6 (H) 11/02/2015   HGB 14.1 11/02/2015   HCT 41.7 11/02/2015   PLT 401.0 (H) 11/02/2015   GLUCOSE 101 (H) 11/02/2015   CHOL 185 11/02/2015   TRIG 169.0 (H) 11/02/2015   HDL 58.50 11/02/2015   LDLDIRECT 117.0 11/05/2014   LDLCALC 93 11/02/2015   ALT 18 11/02/2015   AST 16 11/02/2015   NA 140 11/02/2015   K 4.2 11/02/2015   CL 105 11/02/2015   CREATININE 0.72 11/02/2015   BUN 12 11/02/2015   CO2 29 11/02/2015   TSH 1.08 11/02/2015    Lab Results  Component Value Date   TSH 1.08 11/02/2015   Lab Results  Component Value Date   WBC 13.6 (H) 11/02/2015   HGB 14.1 11/02/2015   HCT 41.7 11/02/2015   MCV 92.9 11/02/2015   PLT 401.0 (H) 11/02/2015   Lab Results  Component Value Date   NA 140 11/02/2015   K 4.2  11/02/2015   CO2 29 11/02/2015   GLUCOSE 101 (H) 11/02/2015   BUN 12 11/02/2015   CREATININE 0.72 11/02/2015   BILITOT 0.4 11/02/2015   ALKPHOS 63 11/02/2015   AST 16 11/02/2015   ALT 18 11/02/2015   PROT 7.1 11/02/2015   ALBUMIN 4.1 11/02/2015   CALCIUM 9.5 11/02/2015   GFR 85.47 11/02/2015   Lab Results  Component Value Date   CHOL 185 11/02/2015   Lab Results  Component Value Date   HDL 58.50 11/02/2015   Lab Results  Component Value Date   LDLCALC 93 11/02/2015   Lab Results  Component Value Date   TRIG 169.0 (H) 11/02/2015   Lab Results  Component Value Date   CHOLHDL 3 11/02/2015   No results found for: HGBA1C       Assessment & Plan:   Problem List Items Addressed This Visit    HTN (hypertension)    Well controlled, no changes to meds. Encouraged heart healthy diet such as the DASH diet and exercise as tolerated.       Relevant Medications   rosuvastatin (CRESTOR) 10 MG tablet   losartan (COZAAR) 50 MG tablet   Other Relevant Orders   CBC   Comprehensive metabolic panel   TSH   Hyperlipidemia    Tolerating statin, encouraged heart healthy diet, avoid trans fats, minimize simple carbs and saturated fats. Increase exercise as tolerated      Relevant Medications   rosuvastatin (CRESTOR) 10 MG tablet   losartan (COZAAR) 50 MG tablet   Other Relevant Orders   Lipid panel   Tobacco use    Encouraged ongoing complete cessation. Discussed need to quit as relates to risk of numerous cancers, cardiac and pulmonary disease as well as neurologic complications. Counseled for greater than 3 minutes. Last cigarette at Christmas, prior to that was October      GERD (gastroesophageal reflux disease)    Avoid offending foods, take probiotics. Do not eat large meals in late evening and consider raising head of  bed.       Relevant Medications   esomeprazole (NEXIUM) 40 MG capsule   Osteopenia    Encouraged to get adequate exercise, calcium and vitamin d  intake. Check a Dexa scan today.       Relevant Orders   DG Bone Density   Rotator cuff tear, right    Still struggles with right shoulder pain is following with Dr Erlinda Hong of orthopaedics.  Responded to Cortisone shot. She feels well at present will follow up there as needed. Has a rotator cuff tear and is limited      Preventative health care    Patient encouraged to maintain heart healthy diet, regular exercise, adequate sleep. Consider daily probiotics. Take medications as prescribed.        Other Visit Diagnoses    Estrogen deficiency    -  Primary   Relevant Orders   DG Bone Density      I have changed Ms. Pang's loratadine. I am also having her maintain her fish oil-omega-3 fatty acids, multivitamin with minerals, calcium-vitamin D, valACYclovir, doxycycline, rosuvastatin, esomeprazole, and losartan.  Meds ordered this encounter  Medications  . loratadine (CLARITIN) 10 MG tablet    Sig: Take 1 tablet (10 mg total) by mouth daily.    Dispense:  90 tablet    Refill:  1  . rosuvastatin (CRESTOR) 10 MG tablet    Sig: Take 1 tablet (10 mg total) by mouth daily.    Dispense:  90 tablet    Refill:  1  . esomeprazole (NEXIUM) 40 MG capsule    Sig: Take 1 capsule (40 mg total) by mouth as needed.    Dispense:  90 capsule    Refill:  1  . losartan (COZAAR) 50 MG tablet    Sig: Take 1 tablet (50 mg total) by mouth daily.    Dispense:  90 tablet    Refill:  1    CMA served as Education administrator during this visit. History, Physical and Plan performed by medical provider. Documentation and orders reviewed and attested to.  Penni Homans, MD

## 2016-05-08 NOTE — Progress Notes (Signed)
Pre visit review using our clinic review tool, if applicable. No additional management support is needed unless otherwise documented below in the visit note. 

## 2016-05-08 NOTE — Assessment & Plan Note (Signed)
Avoid offending foods, take probiotics. Do not eat large meals in late evening and consider raising head of bed.  

## 2016-05-08 NOTE — Assessment & Plan Note (Signed)
Tolerating statin, encouraged heart healthy diet, avoid trans fats, minimize simple carbs and saturated fats. Increase exercise as tolerated 

## 2016-05-08 NOTE — Assessment & Plan Note (Signed)
Well controlled, no changes to meds. Encouraged heart healthy diet such as the DASH diet and exercise as tolerated.  °

## 2016-05-08 NOTE — Assessment & Plan Note (Addendum)
Encouraged to get adequate exercise, calcium and vitamin d intake. Check a Dexa scan today.

## 2016-05-08 NOTE — Assessment & Plan Note (Addendum)
Still struggles with right shoulder pain is following with Dr Erlinda Hong of orthopaedics.  Responded to Cortisone shot. She feels well at present will follow up there as needed. Has a rotator cuff tear and is limited

## 2016-05-09 ENCOUNTER — Other Ambulatory Visit: Payer: Self-pay | Admitting: Family Medicine

## 2016-05-09 MED ORDER — ROSUVASTATIN CALCIUM 20 MG PO TABS: 20.0000 mg | ORAL_TABLET | Freq: Every day | ORAL | 2 refills | Status: DC

## 2016-08-31 ENCOUNTER — Ambulatory Visit (INDEPENDENT_AMBULATORY_CARE_PROVIDER_SITE_OTHER): Payer: Medicare Other | Admitting: Orthopaedic Surgery

## 2016-08-31 ENCOUNTER — Encounter (INDEPENDENT_AMBULATORY_CARE_PROVIDER_SITE_OTHER): Payer: Self-pay | Admitting: Orthopaedic Surgery

## 2016-08-31 VITALS — BP 118/67 | HR 84 | Ht 59.0 in | Wt 125.0 lb

## 2016-08-31 DIAGNOSIS — M7541 Impingement syndrome of right shoulder: Secondary | ICD-10-CM

## 2016-08-31 DIAGNOSIS — G8929 Other chronic pain: Secondary | ICD-10-CM | POA: Diagnosis not present

## 2016-08-31 DIAGNOSIS — M24111 Other articular cartilage disorders, right shoulder: Secondary | ICD-10-CM

## 2016-08-31 DIAGNOSIS — M25511 Pain in right shoulder: Secondary | ICD-10-CM

## 2016-08-31 DIAGNOSIS — M7581 Other shoulder lesions, right shoulder: Secondary | ICD-10-CM

## 2016-08-31 MED ORDER — LIDOCAINE HCL 1 % IJ SOLN
2.0000 mL | INTRAMUSCULAR | Status: AC | PRN
  Administered 2016-08-31: 2 mL

## 2016-08-31 MED ORDER — METHYLPREDNISOLONE ACETATE 40 MG/ML IJ SUSP
80.0000 mg | INTRAMUSCULAR | Status: AC | PRN
  Administered 2016-08-31: 80 mg

## 2016-08-31 MED ORDER — BUPIVACAINE HCL 0.5 % IJ SOLN
2.0000 mL | INTRAMUSCULAR | Status: AC | PRN
  Administered 2016-08-31: 2 mL via INTRA_ARTICULAR

## 2016-08-31 MED ORDER — LIDOCAINE HCL 1 % IJ SOLN
3.0000 mL | INTRAMUSCULAR | Status: AC | PRN
  Administered 2016-08-31: 3 mL

## 2016-08-31 NOTE — Progress Notes (Signed)
Office Visit Note   Patient: Robin [name]           Date of Birth: 02/12/1945           MRN: [id number] Visit Date: 08/31/2016              Requested by: Mosie Lukes, MD [address] [place], [place] [address] PCP: Mosie Lukes, MD   Assessment & Plan: Visit Diagnoses:  1. Chronic right shoulder pain   2. Impingement syndrome of right shoulder   3. Labral tear of shoulder, degenerative, right   4. Rotator cuff tendonitis, right     Plan:  #1: Corticosteroid injection subacromially and intra-articular to the right shoulder. #2 a discussion of possible subacromial decompression and distal clavicle excision with possible rotator cuff repair if indicated may be in the future. We will see how she does with the injection. If is not beneficial than she'll return and we can discuss the surgical nature of treatment.    Follow-Up Instructions: Return if symptoms worsen or fail to improve.   Orders:  Orders Placed This Encounter  Procedures  . Large Joint Injection/Arthrocentesis   No orders of the defined types were placed in this encounter.     Procedures: Large Joint Inj Date/Time: 08/31/2016 1:29 PM Performed by: Biagio Borg D Authorized by: Biagio Borg D   Consent Given by:  Patient Timeout: prior to procedure the correct patient, procedure, and site was verified   Indications:  Pain Location:  Shoulder Site:  R subacromial bursa Prep: patient was prepped and draped in usual sterile fashion   Needle Size:  25 G Needle Length:  1.5 inches Approach:  Lateral Ultrasound Guidance: No   Fluoroscopic Guidance: No   Arthrogram: No   Medications:  80 mg methylPREDNISolone acetate 40 MG/ML; 2 mL lidocaine 1 %; 2 mL bupivacaine 0.5 %; 3 mL lidocaine 1 % Aspiration Attempted: No   Patient tolerance:  Patient tolerated the procedure well with no immediate complications  An aliquot of mixture was placed subacromially and  intra-articularly.     Clinical Data: No additional findings.   Subjective: Chief Complaint  Patient presents with  . Right Shoulder - Pain  . Shoulder Pain    Right shoulder pain x 2 weeks, worsening, hand drawing up, weakness, limited range of motion, difficulty sleeping at night, pain from neck down to hand, tingling, "electric shock", Advil helps    HPI  Lilliauna is a very pleasant 71 year old white female who is seen today for evaluation of her right shoulder. She has been previously seen by Dr. Erlinda Hong right shoulder pain. Apparently some time in August or so she had felt shoulder pain from aqua therapy. She felt a pop and had difficulty with raising her arm and doing ADLs at that time. She was seen in February 2018 at that time and at that time an MRI scan was ordered.  At that time revealed a moderate rotator cuff tendinopathy with interstitial tears and bursal and articular surface fraying and fibrillation. There was no discrete full-thickness tear. Mild tendinopathy of the intra-articular portion of long head of the biceps tendon. She did have degenerated and likely torn superior labrum. There was moderate subacromial and subdeltoid bursitis.   At that time a corticosteroid injection was performed by Dr. Ernestina Patches under C-arm and apparently she had good results. She had not been seen again except for follow-up visit in March stating 99% improvement.  However 2 weeks ago  she started developing pain in the shoulder again. She was doing a lot of yard work and using a [employer] of which she had to pull to get it started. Her pain came back and she was painful in the biceps muscle area. This was what she had prior. She however has noted that she is having some symptoms into the forearm and into her palm which is different. She denies any cervical spine pain. Denies any recent injuries. Seen today for evaluation.  Review of Systems  Cardiovascular:       History of hypertension  All other  systems reviewed and are negative.    Objective: Vital Signs: BP 118/67 (BP Location: Left Arm, Patient Position: Sitting, Cuff Size: Normal)   Pulse 84   Ht 4\' 11"  (1.499 m)   Wt 125 lb (56.7 kg)   BMI 25.25 kg/m   Physical Exam  Constitutional: She is oriented to person, place, and time. She appears well-developed and well-nourished.  HENT:  Head: Normocephalic and atraumatic.  Eyes: Pupils are equal, round, and reactive to light. EOM are normal.  Pulmonary/Chest: Effort normal.  Neurological: She is alert and oriented to person, place, and time.  Skin: Skin is warm and dry.  Psychiatric: She has a normal mood and affect. Her behavior is normal. Judgment and thought content normal.    Ortho Exam  Today she is able to perform full forward flexion. With the abduction no she can get around 160. External rotation to 90 internal rotation to 45. Positive empty can test the in about 90 of forward flexion and abduction. She appears to be weak in internal rotation resistance. She is fairly strong in abduction as well as external rotation with the arm at her side.  Specialty Comments:  No specialty comments available.  Imaging: No results found.   PMFS History: Patient Active Problem List   Diagnosis Date Noted  . Chronic right shoulder pain 03/03/2016  . Medicare annual wellness visit, subsequent 05/06/2015  . Preventative health care 06/20/2014  . Grief reaction 06/04/2014  . Leukocytosis 04/14/2013  . HTN (hypertension) 11/26/2012  . Hyperlipidemia 11/26/2012  . DJD (degenerative joint disease) 11/26/2012  . Tobacco use 11/26/2012  . GERD (gastroesophageal reflux disease) 11/26/2012  . Osteopenia 11/26/2012  . Family history of colon cancer 11/26/2012  . Allergic rhinitis 11/26/2012  . Endometriosis 11/26/2012  . Rotator cuff tear, right 11/26/2012   Past Medical History:  Diagnosis Date  . Acute bronchitis 11/06/2015  . Arthritis   . 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  Grief reaction 06/04/2014  . Hyperlipidemia   . Hypertension   . Medicare annual wellness visit, subsequent 06/20/2014  . Medicare annual wellness visit, subsequent 05/06/2015  . Osteopenia   . Preventative health care 06/20/2014   Medication: Review, verify sig & reconcile(including outside meds): Duplicates discarded: DM supply source:  Preferred Pharmacy and which med where:[facility] (MAIL ORDER) [place], [place] 90 day supply/mail order: 90 Local pharmacy: [place], [place]  Allergies verified:UTD  Immunization Status: Prompt  . Rotator cuff tear, right 11/26/2012    Family History  Problem Relation Age of Onset  . Cancer Father 91       colon, preCA?  Marland Kitchen Arthritis Father        rheumatoid arthritis  . Heart disease Father        stents  . Stroke Maternal Grandmother   .  Hypertension Maternal Grandmother   . Heart disease Paternal Grandfather   . COPD Mother        smoker  . Hypertension Mother   . Arthritis Sister   . Urolithiasis Sister   . Heart disease Son     Past Surgical History:  Procedure Laterality Date  . EYE SURGERY Left 07/08/13  . OOPHORECTOMY Right   . SHOULDER ARTHROSCOPY     Social History   Occupational History  . Not on file.   Social History Main Topics  . Smoking status: Current Every Day Smoker    Packs/day: 0.50    Years: 29.00    Types: Cigarettes    Start date: 01/03/1983  . Smokeless tobacco: Never Used     Comment: 06-02-13  still smoking  . Alcohol use 12.6 oz/week    21 Glasses of wine per week  . Drug use: No  . Sexual activity: No     Comment: no dietary restrictions, smokes, lives with husband

## 2016-11-06 ENCOUNTER — Ambulatory Visit (INDEPENDENT_AMBULATORY_CARE_PROVIDER_SITE_OTHER): Payer: Medicare Other | Admitting: Family Medicine

## 2016-11-06 ENCOUNTER — Encounter: Payer: Self-pay | Admitting: Family Medicine

## 2016-11-06 VITALS — Temp 98.1°F | Ht 58.75 in | Wt 123.0 lb

## 2016-11-06 DIAGNOSIS — M8580 Other specified disorders of bone density and structure, unspecified site: Secondary | ICD-10-CM | POA: Diagnosis not present

## 2016-11-06 DIAGNOSIS — I10 Essential (primary) hypertension: Secondary | ICD-10-CM

## 2016-11-06 DIAGNOSIS — L719 Rosacea, unspecified: Secondary | ICD-10-CM | POA: Diagnosis not present

## 2016-11-06 DIAGNOSIS — D72829 Elevated white blood cell count, unspecified: Secondary | ICD-10-CM

## 2016-11-06 DIAGNOSIS — Z23 Encounter for immunization: Secondary | ICD-10-CM | POA: Diagnosis not present

## 2016-11-06 DIAGNOSIS — Z720 Tobacco use: Secondary | ICD-10-CM | POA: Diagnosis not present

## 2016-11-06 DIAGNOSIS — E785 Hyperlipidemia, unspecified: Secondary | ICD-10-CM

## 2016-11-06 HISTORY — DX: Rosacea, unspecified: L71.9

## 2016-11-06 LAB — COMPREHENSIVE METABOLIC PANEL
ALT: 17 U/L (ref 0–35)
AST: 18 U/L (ref 0–37)
Albumin: 4.7 g/dL (ref 3.5–5.2)
Alkaline Phosphatase: 62 U/L (ref 39–117)
BILIRUBIN TOTAL: 0.6 mg/dL (ref 0.2–1.2)
BUN: 11 mg/dL (ref 6–23)
CHLORIDE: 106 meq/L (ref 96–112)
CO2: 28 meq/L (ref 19–32)
CREATININE: 0.69 mg/dL (ref 0.40–1.20)
Calcium: 10.9 mg/dL — ABNORMAL HIGH (ref 8.4–10.5)
GFR: 89.51 mL/min (ref >60.00)
GLUCOSE: 106 mg/dL — AB (ref 70–99)
Potassium: 4.8 mEq/L (ref 3.5–5.1)
SODIUM: 142 meq/L (ref 135–145)
Total Protein: 7.6 g/dL (ref 6.0–8.3)

## 2016-11-06 LAB — LIPID PANEL
CHOL/HDL RATIO: 3
Cholesterol: 189 mg/dL (ref 0–200)
HDL: 65.8 mg/dL (ref >39.00)
LDL Cholesterol: 91 mg/dL (ref 0–99)
NONHDL: 123.59
Triglycerides: 164 mg/dL — ABNORMAL HIGH (ref 0.0–149.0)
VLDL: 32.8 mg/dL (ref 0.0–40.0)

## 2016-11-06 LAB — CBC WITH DIFFERENTIAL/PLATELET
BASOS PCT: 1.2 % (ref 0.0–3.0)
Basophils Absolute: 0.1 10*3/uL (ref 0.0–0.1)
EOS ABS: 0.1 10*3/uL (ref 0.0–0.7)
Eosinophils Relative: 1.1 % (ref 0.0–5.0)
HCT: 42.4 % (ref 36.0–46.0)
Hemoglobin: 13.9 g/dL (ref 12.0–15.0)
LYMPHS ABS: 3 10*3/uL (ref 0.7–4.0)
Lymphocytes Relative: 29 % (ref 12.0–46.0)
MCHC: 32.8 g/dL (ref 30.0–36.0)
MCV: 97.5 fl (ref 78.0–100.0)
MONOS PCT: 6.9 % (ref 3.0–12.0)
Monocytes Absolute: 0.7 10*3/uL (ref 0.1–1.0)
NEUTROS ABS: 6.4 10*3/uL (ref 1.4–7.7)
NEUTROS PCT: 61.8 % (ref 43.0–77.0)
PLATELETS: 418 10*3/uL — AB (ref 150.0–400.0)
RBC: 4.35 Mil/uL (ref 3.87–5.11)
RDW: 14.1 % (ref 11.5–15.5)
WBC: 10.4 10*3/uL (ref 4.0–10.5)

## 2016-11-06 LAB — TSH: TSH: 1.24 u[IU]/mL (ref 0.35–4.50)

## 2016-11-06 NOTE — Progress Notes (Signed)
Subjective:  Medical Student served as a scribe  Patient ID: Robin [name], female    DOB: 01-04-1946, 71 y.o.   MRN: [id number]  Chief Complaint  Patient presents with  . Follow-up    6 month follow   . Hypertension  . Medication Reaction    states that her losartan makes her toes sensitive to sunlight.     HPI  Patient is in today for 6 month follow up. She notes no acute concerns. She states that a couple of weeks ago she was cleaning her outdoor patio umbrella with bleach and felt some burning, which, combined with her allergies, led to several days of coughs and nasal congestion. This resolved on its own and patients states she is better today, with some post nasal drip secondary to her yearly allergies. She notes that over the summer she stopped taking her Losartan at 50 mg because it made her toes sensitive, and instead cut her 50 mg tablet in half and took 25 mg twice a day. She states that her pressures have been fairly normal in the 409W systolic when she takes her pressure at home 2 times a month. She exercises a great deal, with an aqua exercise class twice a week as well as weight exercises and indoor walking track. Her friends in her aqua class have switched to Weight Watchers, which has caused her to monitor her diet more closely, as she has reduced her carbohydrate intake and increased her vegetable intake. She notes that she has lost about 8 pounds over the last six months as a result of this diet change and her exercise. She also gardens regularly. She continues to follow with her orthopedic surgeon and receives cortisone shots 2x a year for her shoulder issues. She denies any chest pain, shortness of breath, headache, lightheadedness, dizziness, abdominal pain, constipation, diarrhea, and dysuria. No recent febrile illness or hospitalizations. Denies CP/palp/SOB/HA/congestion/fevers/GI or GU c/o. Taking meds as prescribed  Patient Care Team: Mosie Lukes, MD as PCP - General  (Family Medicine) Mercy Moore, OD as Consulting Physician (Optometry) Laurence Spates, MD as Consulting Physician (Gastroenterology) Prudencio Pair as Physician Assistant (Emergency Medicine)   Past Medical History:  Diagnosis Date  . Acute bronchitis 11/06/2015  . Arthritis   . 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 Grief reaction 06/04/2014  . Hyperlipidemia   . Hypertension   . Medicare annual wellness visit, subsequent 06/20/2014  . Medicare annual wellness visit, subsequent 05/06/2015  . Osteopenia   . Preventative health care 06/20/2014   Medication: Review, verify sig & reconcile(including outside meds): Duplicates discarded: DM supply source:  Preferred Pharmacy and which med where:[facility] (MAIL ORDER) [place], [place] 90 day supply/mail order: 90 Local pharmacy: [place], [place]  Allergies verified:UTD  Immunization Status: Prompt  . Rosacea 11/06/2016  . Rotator cuff tear, right 11/26/2012    Past Surgical History:  Procedure Laterality Date  . EYE SURGERY Left 07/08/13  . OOPHORECTOMY Right   . SHOULDER ARTHROSCOPY      Family History  Problem Relation Age of Onset  . Cancer Father 66       colon, preCA?  Marland Kitchen Arthritis Father        rheumatoid arthritis  . Heart disease Father        stents  . Stroke Maternal Grandmother   . Hypertension Maternal Grandmother   . Heart disease Paternal  Grandfather   . COPD Mother        smoker  . Hypertension Mother   . Arthritis Sister   . Urolithiasis Sister   . Heart disease Son     Social History   Social History  . Marital status: Married    Spouse name: N/A  . Number of children: N/A  . Years of education: N/A   Occupational History  . Not on file.   Social History Main Topics  . Smoking status: Current Every Day Smoker    Packs/day: 0.50    Years: 29.00    Types: Cigarettes    Start date: 01/03/1983  . Smokeless  tobacco: Never Used     Comment: 06-02-13  still smoking  . Alcohol use 12.6 oz/week    21 Glasses of wine per week  . Drug use: No  . Sexual activity: No     Comment: no dietary restrictions, smokes, lives with husband    Other Topics Concern  . Not on file   Social History Narrative  . No narrative on file    Outpatient Medications Prior to Visit  Medication Sig Dispense Refill  . calcium-vitamin D (OSCAL WITH D) 500-200 MG-UNIT per tablet Take 1 tablet by mouth.    . fish oil-omega-3 fatty acids 1000 MG capsule Take 1,200 mg by mouth daily.     Marland Kitchen loratadine (CLARITIN) 10 MG tablet Take 1 tablet (10 mg total) by mouth daily. 90 tablet 1  . losartan (COZAAR) 50 MG tablet Take 1 tablet (50 mg total) by mouth daily. 90 tablet 1  . Multiple Vitamins-Minerals (MULTIVITAMIN WITH MINERALS) tablet Take 1 tablet by mouth daily.    . rosuvastatin (CRESTOR) 20 MG tablet Take 1 tablet (20 mg total) by mouth daily. 90 tablet 2  . doxycycline (VIBRA-TABS) 100 MG tablet Take 1 tablet (100 mg total) by mouth 2 (two) times daily. (Patient not taking: Reported on 08/31/2016) 28 tablet 0  . esomeprazole (NEXIUM) 40 MG capsule Take 1 capsule (40 mg total) by mouth as needed. 90 capsule 1  . valACYclovir (VALTREX) 500 MG tablet Take 1 tablet (500 mg total) by mouth 2 (two) times daily. (Patient taking differently: Take 500 mg by mouth 2 (two) times daily as needed. ) 60 tablet 1   No facility-administered medications prior to visit.     Allergies  Allergen Reactions  . Pravastatin Anaphylaxis    myalgias  . Codeine Other (See Comments)    Review of Systems  Constitutional: Negative for chills and fever.  HENT: Negative for ear pain, hearing loss and tinnitus.   Eyes: Negative for blurred vision, double vision, photophobia and redness.  Respiratory: Negative for cough, hemoptysis, sputum production, shortness of breath and wheezing.   Cardiovascular: Negative for chest pain, palpitations and  orthopnea.  Gastrointestinal: Negative for abdominal pain, blood in stool, constipation, diarrhea, heartburn, melena, nausea and vomiting.  Genitourinary: Negative for dysuria, frequency and urgency.  Musculoskeletal: Negative for falls and myalgias.  Skin: Negative for rash.  Neurological: Negative for dizziness, sensory change, speech change, focal weakness and headaches.  Psychiatric/Behavioral: Negative for depression.       Objective:    Physical Exam  Constitutional: She is oriented to person, place, and time. She appears well-developed and well-nourished. No distress.  HENT:  Head: Normocephalic and atraumatic.  Right Ear: External ear normal.  Left Ear: External ear normal.  Eyes: Pupils are equal, round, and reactive to light. Right eye exhibits no discharge. Left  eye exhibits no discharge.  Neck: Normal range of motion. Neck supple. No JVD present. No thyromegaly present.  Cardiovascular: Normal rate, regular rhythm, normal heart sounds and intact distal pulses.  Exam reveals no gallop and no friction rub.   No murmur heard. Pulmonary/Chest: Effort normal and breath sounds normal. No respiratory distress. She has no wheezes. She has no rales. She exhibits no tenderness.  Abdominal: Soft. Bowel sounds are normal. She exhibits no distension. There is no tenderness.  Musculoskeletal: Normal range of motion. She exhibits no edema.  Neurological: She is alert and oriented to person, place, and time.  Skin: Skin is warm and dry. She is not diaphoretic.  Psychiatric: She has a normal mood and affect. Her behavior is normal. Thought content normal.    Temp 98.1 F (36.7 C) (Oral)   Ht 4' 10.75" (1.492 m)   Wt 123 lb (55.8 kg)   SpO2 98%   BMI 25.05 kg/m  Wt Readings from Last 3 Encounters:  11/06/16 123 lb (55.8 kg)  08/31/16 125 lb (56.7 kg)  05/08/16 129 lb 12.8 oz (58.9 kg)   BP Readings from Last 3 Encounters:  08/31/16 118/67  05/08/16 128/71  11/05/15 (!) 156/72       Immunization History  Administered Date(s) Administered  . Influenza, High Dose Seasonal PF 11/06/2016  . Influenza,inj,Quad PF,6+ Mos 11/26/2012, 12/01/2013, 11/05/2014, 12/09/2014  . Influenza-Unspecified 10/21/2015  . Pneumococcal Conjugate-13 11/06/2016  . Pneumococcal Polysaccharide-23 01/06/2013    Health Maintenance  Topic Date Due  . Hepatitis C Screening  02-06-1945  . MAMMOGRAM  01/31/2018  . TETANUS/TDAP  06/04/2021  . COLONOSCOPY  02/05/2022  . INFLUENZA VACCINE  Completed  . DEXA SCAN  Completed  . PNA vac Low Risk Adult  Completed    Lab Results  Component Value Date   WBC 10.4 11/06/2016   HGB 13.9 11/06/2016   HCT 42.4 11/06/2016   PLT 418.0 (H) 11/06/2016   GLUCOSE 106 (H) 11/06/2016   CHOL 189 11/06/2016   TRIG 164.0 (H) 11/06/2016   HDL 65.80 11/06/2016   LDLDIRECT 135.0 05/08/2016   LDLCALC 91 11/06/2016   ALT 17 11/06/2016   AST 18 11/06/2016   NA 142 11/06/2016   K 4.8 11/06/2016   CL 106 11/06/2016   CREATININE 0.69 11/06/2016   BUN 11 11/06/2016   CO2 28 11/06/2016   TSH 1.24 11/06/2016    Lab Results  Component Value Date   TSH 1.24 11/06/2016   Lab Results  Component Value Date   WBC 10.4 11/06/2016   HGB 13.9 11/06/2016   HCT 42.4 11/06/2016   MCV 97.5 11/06/2016   PLT 418.0 (H) 11/06/2016   Lab Results  Component Value Date   NA 142 11/06/2016   K 4.8 11/06/2016   CO2 28 11/06/2016   GLUCOSE 106 (H) 11/06/2016   BUN 11 11/06/2016   CREATININE 0.69 11/06/2016   BILITOT 0.6 11/06/2016   ALKPHOS 62 11/06/2016   AST 18 11/06/2016   ALT 17 11/06/2016   PROT 7.6 11/06/2016   ALBUMIN 4.7 11/06/2016   CALCIUM 10.9 (H) 11/06/2016   GFR 89.51 11/06/2016   Lab Results  Component Value Date   CHOL 189 11/06/2016   Lab Results  Component Value Date   HDL 65.80 11/06/2016   Lab Results  Component Value Date   LDLCALC 91 11/06/2016   Lab Results  Component Value Date   TRIG 164.0 (H) 11/06/2016   Lab Results   Component Value Date  CHOLHDL 3 11/06/2016   No results found for: HGBA1C       Assessment & Plan:   Problem List Items Addressed This Visit    HTN (hypertension)    Well controlled, no changes to meds. Encouraged heart healthy diet such as the DASH diet and exercise as tolerated. Only taking Losartan 25 mg but misses doses. She felt it caused photosensitivity on the 50 mg so at 25 mg she feels it is better. She is going to monitor her BP, she reports it has been better elsewhere, 120 or 130s.      Relevant Orders   Comprehensive metabolic panel (Completed)   TSH (Completed)   Hyperlipidemia    Tolerating statin, encouraged heart healthy diet, avoid trans fats, minimize simple carbs and saturated fats. Increase exercise as tolerated      Relevant Orders   Lipid panel (Completed)   Tobacco use    No longer using tobacco productsApril of 2018 almost nothing. Since May nothing encouraged ongoing efforts       Osteopenia    Encouraged to get adequate exercise, calcium and vitamin d intake      Leukocytosis    Recheck CBC todeay      Relevant Orders   CBC with Differential/Platelet (Completed)   Rosacea    Is doing well off of Doxycycline has a new sun screen that is working well       Other Visit Diagnoses    Need for immunization against influenza    -  Primary   Relevant Orders   Flu vaccine HIGH DOSE PF (Fluzone High dose) (Completed)      1. HTN Her hypertension has generally been well-controlled. As described in the HPI, she reduced her dose of Losartan to 25 mg daily from 50 mg daily due to concerns of photosensitivity of her toes. While her self-taken blood pressures have been normal in 120s, in the office today her BP was 158/60 in both arms. Encouraged patient to resume taking 50 mg of losartan daily, which is fine to take at 25 mg twice a day. Encouraged patient to check BP if she feels any headaches or lightheadedness, as well as on a regular basis at  least once a week.   2. Hyperlipidemia She is tolerating her statins well. Will recheck her cholesterol today with a lipid panel. Patient has recently changed her diet to include more vegetables and less carbohydrates as noted in the HPI, and exercises regularly, as also noted in the HPI. Have applauded this effort and continued to encourage a heart healthy diet.   3. Tobacco Use She had stopped smoking in December of 2017, but had a couple cigarettes on one occasion in April of 2018. Has not smoked since May of 2018. Her husband continues to smoke but she reports that she has him smoke outside away from the house when he can. Applauded her efforts and encouraged her to continue to not smoke. Her lungs were clear to auscultation bilaterally without wheezing and she denies any cough or hemoptysis.   4. Osteopenia Patient exercises regularly, which was applauded. She takes a calcium-vitamin D supplement daily. Encouraged patient to increase vitamin D supplementation to 2000 IU daily and continue to eat a diet with adequate calcium.  5. Leukocytosis Patients WBC was elevated at 12.2 during her lab visit. Recheck today with CBC.  6. Rosacea Was on doxycycline before but is doing well without it. Has new sunscreen and make up that she states is helping  keep it well-controlled.   7. General Health/ Preventative Services Obtain CMP and TSH today. Patient received flu shot and PCV 13 shot today.   I have discontinued Ms. Baruch's valACYclovir, doxycycline, and esomeprazole. I am also having her maintain her fish oil-omega-3 fatty acids, multivitamin with minerals, calcium-vitamin D, loratadine, losartan, and rosuvastatin.  No orders of the defined types were placed in this encounter.   Patient seen with and examined with student.  Agree with documentation  Penni Homans, MD

## 2016-11-06 NOTE — Assessment & Plan Note (Addendum)
No longer using tobacco productsApril of 2018 almost nothing. Since May nothing encouraged ongoing efforts

## 2016-11-06 NOTE — Progress Notes (Signed)
Duplicate note opened in error HPI  ROS Physical Exam

## 2016-11-06 NOTE — Assessment & Plan Note (Signed)
Recheck CBC todeay

## 2016-11-06 NOTE — Patient Instructions (Addendum)
Add Vitamin D 2000 IU daily Encouraged increased rest and hydration, add probiotics, zinc such as Coldeze or Xicam. Treat fevers as needed, Ekderberry, vitamin C  Hypertension Hypertension, commonly called high blood pressure, is when the force of blood pumping through the arteries is too strong. The arteries are the blood vessels that carry blood from the heart throughout the body. Hypertension forces the heart to work harder to pump blood and may cause arteries to become narrow or stiff. Having untreated or uncontrolled hypertension can cause heart attacks, strokes, kidney disease, and other problems. A blood pressure reading consists of a higher number over a lower number. Ideally, your blood pressure should be below 120/80. The first ("top") number is called the systolic pressure. It is a measure of the pressure in your arteries as your heart beats. The second ("bottom") number is called the diastolic pressure. It is a measure of the pressure in your arteries as the heart relaxes. What are the causes? The cause of this condition is not known. What increases the risk? Some risk factors for high blood pressure are under your control. Others are not. Factors you can change  Smoking.  Having type 2 diabetes mellitus, high cholesterol, or both.  Not getting enough exercise or physical activity.  Being overweight.  Having too much fat, sugar, calories, or salt (sodium) in your diet.  Drinking too much alcohol. Factors that are difficult or impossible to change  Having chronic kidney disease.  Having a family history of high blood pressure.  Age. Risk increases with age.  Race. You may be at higher risk if you are African-American.  Gender. Men are at higher risk than women before age 4. After age 64, women are at higher risk than men.  Having obstructive sleep apnea.  Stress. What are the signs or symptoms? Extremely high blood pressure (hypertensive crisis) may  cause:  Headache.  Anxiety.  Shortness of breath.  Nosebleed.  Nausea and vomiting.  Severe chest pain.  Jerky movements you cannot control (seizures).  How is this diagnosed? This condition is diagnosed by measuring your blood pressure while you are seated, with your arm resting on a surface. The cuff of the blood pressure monitor will be placed directly against the skin of your upper arm at the level of your heart. It should be measured at least twice using the same arm. Certain conditions can cause a difference in blood pressure between your right and left arms. Certain factors can cause blood pressure readings to be lower or higher than normal (elevated) for a short period of time:  When your blood pressure is higher when you are in a health care provider's office than when you are at home, this is called white coat hypertension. Most people with this condition do not need medicines.  When your blood pressure is higher at home than when you are in a health care provider's office, this is called masked hypertension. Most people with this condition may need medicines to control blood pressure.  If you have a high blood pressure reading during one visit or you have normal blood pressure with other risk factors:  You may be asked to return on a different day to have your blood pressure checked again.  You may be asked to monitor your blood pressure at home for 1 week or longer.  If you are diagnosed with hypertension, you may have other blood or imaging tests to help your health care provider understand your overall risk for other  conditions. How is this treated? This condition is treated by making healthy lifestyle changes, such as eating healthy foods, exercising more, and reducing your alcohol intake. Your health care provider may prescribe medicine if lifestyle changes are not enough to get your blood pressure under control, and if:  Your systolic blood pressure is above  130.  Your diastolic blood pressure is above 80.  Your personal target blood pressure may vary depending on your medical conditions, your age, and other factors. Follow these instructions at home: Eating and drinking  Eat a diet that is high in fiber and potassium, and low in sodium, added sugar, and fat. An example eating plan is called the DASH (Dietary Approaches to Stop Hypertension) diet. To eat this way: ? Eat plenty of fresh fruits and vegetables. Try to fill half of your plate at each meal with fruits and vegetables. ? Eat whole grains, such as whole wheat pasta, brown rice, or whole grain bread. Fill about one quarter of your plate with whole grains. ? Eat or drink low-fat dairy products, such as skim milk or low-fat yogurt. ? Avoid fatty cuts of meat, processed or cured meats, and poultry with skin. Fill about one quarter of your plate with lean proteins, such as fish, chicken without skin, beans, eggs, and tofu. ? Avoid premade and processed foods. These tend to be higher in sodium, added sugar, and fat.  Reduce your daily sodium intake. Most people with hypertension should eat less than 1,500 mg of sodium a day.  Limit alcohol intake to no more than 1 drink a day for nonpregnant women and 2 drinks a day for men. One drink equals 12 oz of beer, 5 oz of wine, or 1 oz of hard liquor. Lifestyle  Work with your health care provider to maintain a healthy body weight or to lose weight. Ask what an ideal weight is for you.  Get at least 30 minutes of exercise that causes your heart to beat faster (aerobic exercise) most days of the week. Activities may include walking, swimming, or biking.  Include exercise to strengthen your muscles (resistance exercise), such as pilates or lifting weights, as part of your weekly exercise routine. Try to do these types of exercises for 30 minutes at least 3 days a week.  Do not use any products that contain nicotine or tobacco, such as cigarettes and  e-cigarettes. If you need help quitting, ask your health care provider.  Monitor your blood pressure at home as told by your health care provider.  Keep all follow-up visits as told by your health care provider. This is important. Medicines  Take over-the-counter and prescription medicines only as told by your health care provider. Follow directions carefully. Blood pressure medicines must be taken as prescribed.  Do not skip doses of blood pressure medicine. Doing this puts you at risk for problems and can make the medicine less effective.  Ask your health care provider about side effects or reactions to medicines that you should watch for. Contact a health care provider if:  You think you are having a reaction to a medicine you are taking.  You have headaches that keep coming back (recurring).  You feel dizzy.  You have swelling in your ankles.  You have trouble with your vision. Get help right away if:  You develop a severe headache or confusion.  You have unusual weakness or numbness.  You feel faint.  You have severe pain in your chest or abdomen.  You vomit  repeatedly.  You have trouble breathing. Summary  Hypertension is when the force of blood pumping through your arteries is too strong. If this condition is not controlled, it may put you at risk for serious complications.  Your personal target blood pressure may vary depending on your medical conditions, your age, and other factors. For most people, a normal blood pressure is less than 120/80.  Hypertension is treated with lifestyle changes, medicines, or a combination of both. Lifestyle changes include weight loss, eating a healthy, low-sodium diet, exercising more, and limiting alcohol. This information is not intended to replace advice given to you by your health care provider. Make sure you discuss any questions you have with your health care provider. Document Released: 01/16/2005 Document Revised: 12/15/2015  Document Reviewed: 12/15/2015 Elsevier Interactive Patient Education  [employer].

## 2016-11-06 NOTE — Assessment & Plan Note (Addendum)
Well controlled, no changes to meds. Encouraged heart healthy diet such as the DASH diet and exercise as tolerated. Only taking Losartan 25 mg but misses doses. She felt it caused photosensitivity on the 50 mg so at 25 mg she feels it is better. She is going to monitor her BP, she reports it has been better elsewhere, 120 or 130s.

## 2016-11-06 NOTE — Assessment & Plan Note (Signed)
Tolerating statin, encouraged heart healthy diet, avoid trans fats, minimize simple carbs and saturated fats. Increase exercise as tolerated 

## 2016-11-06 NOTE — Assessment & Plan Note (Signed)
Is doing well off of Doxycycline has a new sun screen that is working well

## 2016-11-06 NOTE — Assessment & Plan Note (Signed)
Encouraged to get adequate exercise, calcium and vitamin d intake 

## 2016-11-17 ENCOUNTER — Ambulatory Visit (HOSPITAL_BASED_OUTPATIENT_CLINIC_OR_DEPARTMENT_OTHER)
Admission: RE | Admit: 2016-11-17 | Discharge: 2016-11-17 | Disposition: A | Discharge status: Home / Self Care | Payer: Medicare Other | Source: Ambulatory Visit | Attending: Family Medicine | Admitting: Family Medicine

## 2016-11-17 DIAGNOSIS — M810 Age-related osteoporosis without current pathological fracture: Secondary | ICD-10-CM | POA: Diagnosis not present

## 2016-11-17 DIAGNOSIS — E2839 Other primary ovarian failure: Secondary | ICD-10-CM

## 2016-11-17 DIAGNOSIS — M8580 Other specified disorders of bone density and structure, unspecified site: Secondary | ICD-10-CM | POA: Insufficient documentation

## 2016-11-17 DIAGNOSIS — Z780 Asymptomatic menopausal state: Secondary | ICD-10-CM | POA: Diagnosis not present

## 2016-11-17 DIAGNOSIS — Z87891 Personal history of nicotine dependence: Secondary | ICD-10-CM | POA: Diagnosis not present

## 2016-11-30 DIAGNOSIS — H1851 Endothelial corneal dystrophy: Secondary | ICD-10-CM | POA: Diagnosis not present

## 2016-11-30 DIAGNOSIS — H2511 Age-related nuclear cataract, right eye: Secondary | ICD-10-CM | POA: Diagnosis not present

## 2016-11-30 DIAGNOSIS — Z961 Presence of intraocular lens: Secondary | ICD-10-CM | POA: Diagnosis not present

## 2016-12-30 HISTORY — PX: CATARACT EXTRACTION: SUR2

## 2017-01-04 DIAGNOSIS — H1851 Endothelial corneal dystrophy: Secondary | ICD-10-CM | POA: Diagnosis not present

## 2017-01-04 DIAGNOSIS — Z79899 Other long term (current) drug therapy: Secondary | ICD-10-CM | POA: Diagnosis not present

## 2017-01-04 DIAGNOSIS — Z885 Allergy status to narcotic agent status: Secondary | ICD-10-CM | POA: Diagnosis not present

## 2017-01-04 DIAGNOSIS — H2511 Age-related nuclear cataract, right eye: Secondary | ICD-10-CM | POA: Diagnosis not present

## 2017-01-04 DIAGNOSIS — Z87891 Personal history of nicotine dependence: Secondary | ICD-10-CM | POA: Diagnosis not present

## 2017-01-04 DIAGNOSIS — Z9841 Cataract extraction status, right eye: Secondary | ICD-10-CM | POA: Diagnosis not present

## 2017-01-04 DIAGNOSIS — Z7982 Long term (current) use of aspirin: Secondary | ICD-10-CM | POA: Diagnosis not present

## 2017-01-04 DIAGNOSIS — I10 Essential (primary) hypertension: Secondary | ICD-10-CM | POA: Diagnosis not present

## 2017-04-11 DIAGNOSIS — H2511 Age-related nuclear cataract, right eye: Secondary | ICD-10-CM | POA: Diagnosis not present

## 2017-04-11 DIAGNOSIS — Z9841 Cataract extraction status, right eye: Secondary | ICD-10-CM | POA: Diagnosis not present

## 2017-04-26 ENCOUNTER — Other Ambulatory Visit: Payer: Self-pay | Admitting: Family Medicine

## 2017-05-07 ENCOUNTER — Ambulatory Visit (INDEPENDENT_AMBULATORY_CARE_PROVIDER_SITE_OTHER): Payer: Medicare Other | Admitting: Family Medicine

## 2017-05-07 ENCOUNTER — Encounter: Payer: Self-pay | Admitting: Family Medicine

## 2017-05-07 DIAGNOSIS — E785 Hyperlipidemia, unspecified: Secondary | ICD-10-CM | POA: Diagnosis not present

## 2017-05-07 DIAGNOSIS — I10 Essential (primary) hypertension: Secondary | ICD-10-CM | POA: Diagnosis not present

## 2017-05-07 DIAGNOSIS — R739 Hyperglycemia, unspecified: Secondary | ICD-10-CM

## 2017-05-07 DIAGNOSIS — J309 Allergic rhinitis, unspecified: Secondary | ICD-10-CM

## 2017-05-07 LAB — HEMOGLOBIN A1C: Hgb A1c MFr Bld: 5.8 % (ref 4.6–6.5)

## 2017-05-07 LAB — CBC
HEMATOCRIT: 39.5 % (ref 36.0–46.0)
Hemoglobin: 13.2 g/dL (ref 12.0–15.0)
MCHC: 33.4 g/dL (ref 30.0–36.0)
MCV: 95.9 fl (ref 78.0–100.0)
PLATELETS: 414 10*3/uL — AB (ref 150.0–400.0)
RBC: 4.12 Mil/uL (ref 3.87–5.11)
RDW: 13.7 % (ref 11.5–15.5)
WBC: 10.7 10*3/uL — ABNORMAL HIGH (ref 4.0–10.5)

## 2017-05-07 LAB — LIPID PANEL
CHOL/HDL RATIO: 3
Cholesterol: 199 mg/dL (ref 0–200)
HDL: 73.7 mg/dL (ref >39.00)
LDL CALC: 93 mg/dL (ref 0–99)
NonHDL: 125.66
TRIGLYCERIDES: 165 mg/dL — AB (ref 0.0–149.0)
VLDL: 33 mg/dL (ref 0.0–40.0)

## 2017-05-07 LAB — COMPREHENSIVE METABOLIC PANEL
ALT: 19 U/L (ref 0–35)
AST: 21 U/L (ref 0–37)
Albumin: 4.6 g/dL (ref 3.5–5.2)
Alkaline Phosphatase: 67 U/L (ref 39–117)
BUN: 13 mg/dL (ref 6–23)
CALCIUM: 11 mg/dL — AB (ref 8.4–10.5)
CHLORIDE: 106 meq/L (ref 96–112)
CO2: 30 meq/L (ref 19–32)
CREATININE: 0.82 mg/dL (ref 0.40–1.20)
GFR: 73.24 mL/min (ref >60.00)
Glucose, Bld: 103 mg/dL — ABNORMAL HIGH (ref 70–99)
POTASSIUM: 5.2 meq/L — AB (ref 3.5–5.1)
Sodium: 143 mEq/L (ref 135–145)
Total Bilirubin: 0.7 mg/dL (ref 0.2–1.2)
Total Protein: 7.7 g/dL (ref 6.0–8.3)

## 2017-05-07 LAB — TSH: TSH: 2 u[IU]/mL (ref 0.35–4.50)

## 2017-05-07 MED ORDER — ALBUTEROL SULFATE HFA 108 (90 BASE) MCG/ACT IN AERS
2.0000 | INHALATION_SPRAY | Freq: Four times a day (QID) | RESPIRATORY_TRACT | 0 refills | Status: DC | PRN
Start: 2017-05-07 — End: 2017-06-06

## 2017-05-07 NOTE — Assessment & Plan Note (Signed)
Well controlled, no changes to meds. Encouraged heart healthy diet such as the DASH diet and exercise as tolerated.  °

## 2017-05-07 NOTE — Assessment & Plan Note (Signed)
Encouraged heart healthy diet, increase exercise, avoid trans fats, consider a krill oil cap daily 
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
## 2017-05-07 NOTE — Progress Notes (Signed)
Patient ID: Robin [name], female   DOB: [birth year], 72 y.o.   MRN: [id number]   Subjective:    Patient ID: Robin [name], female    DOB: [birth year], 72 y.o.   MRN: [id number]  Chief Complaint  Patient presents with  . Annual Exam    6 months    HPI Patient is in today for follow up. She feels well today. She is managing her husband's metastatic prostate cancer and he has been given ayear and a half or so. She manages the stress by working in her yard and cooking. She reports she feels well physically and has not aches and pains. Denies CP/palp/SOB/HA/congestion/fevers/GI or GU c/o. Taking meds as prescribed Past Medical History:  Diagnosis Date  . Acute bronchitis 11/06/2015  . Arthritis   . 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 Grief reaction 06/04/2014  . Hyperlipidemia   . Hypertension   . Medicare annual wellness visit, subsequent 06/20/2014  . Medicare annual wellness visit, subsequent 05/06/2015  . Osteopenia   . Preventative health care 06/20/2014   Medication: Review, verify sig & reconcile(including outside meds): Duplicates discarded: DM supply source:  Preferred Pharmacy and which med where:[facility] (MAIL ORDER) [place], [place] 90 day supply/mail order: 90 Local pharmacy: [place], [place]  Allergies verified:UTD  Immunization Status: Prompt  . Rosacea 11/06/2016  . Rotator cuff tear, right 11/26/2012    Past Surgical History:  Procedure Laterality Date  . CATARACT EXTRACTION Right 12/2016  . EYE SURGERY Left 07/08/13  . OOPHORECTOMY Right   . SHOULDER ARTHROSCOPY      Family History  Problem Relation Age of Onset  . Cancer Father 57       colon, preCA?  Marland Kitchen Arthritis Father        rheumatoid arthritis  . Heart disease Father        stents  . Stroke Maternal Grandmother   . Hypertension Maternal Grandmother   . Heart disease Paternal Grandfather   . COPD Mother        smoker  .  Hypertension Mother   . Arthritis Sister   . Urolithiasis Sister   . Heart disease Son     Social History   Socioeconomic History  . Marital status: Married    Spouse name: Not on file  . Number of children: Not on file  . Years of education: Not on file  . Highest education level: Not on file  Occupational History  . Not on file  Social Needs  . Financial resource strain: Not on file  . Food insecurity:    Worry: Not on file    Inability: Not on file  . Transportation needs:    Medical: Not on file    Non-medical: Not on file  Tobacco Use  . Smoking status: Former Smoker    Packs/day: 0.50    Years: 29.00    Pack years: 14.50    Types: Cigarettes    Start date: 01/03/1983  . Smokeless tobacco: Never Used  . Tobacco comment: 06-02-13  still smoking  Substance and Sexual Activity  . Alcohol use: Yes    Alcohol/week: 12.6 oz    Types: 21 Glasses of wine per week  . Drug use: No  . Sexual activity: Never    Comment: no dietary restrictions, smokes, lives with husband   Lifestyle  . Physical activity:    Days per week: Not on file  Minutes per session: Not on file  . Stress: Not on file  Relationships  . Social connections:    Talks on phone: Not on file    Gets together: Not on file    Attends religious service: Not on file    Active member of club or organization: Not on file    Attends meetings of clubs or organizations: Not on file    Relationship status: Not on file  . Intimate partner violence:    Fear of current or ex partner: Not on file    Emotionally abused: Not on file    Physically abused: Not on file    Forced sexual activity: Not on file  Other Topics Concern  . Not on file  Social History Narrative  . Not on file    Outpatient Medications Prior to Visit  Medication Sig Dispense Refill  . calcium-vitamin D (OSCAL WITH D) 500-200 MG-UNIT per tablet Take 1 tablet by mouth.    . fish oil-omega-3 fatty acids 1000 MG capsule Take 1,200 mg by  mouth daily.     Marland Kitchen loratadine (CLARITIN) 10 MG tablet Take 1 tablet (10 mg total) by mouth daily. 90 tablet 1  . losartan (COZAAR) 50 MG tablet Take 1 tablet (50 mg total) by mouth daily. 90 tablet 1  . Multiple Vitamins-Minerals (MULTIVITAMIN WITH MINERALS) tablet Take 1 tablet by mouth daily.    . rosuvastatin (CRESTOR) 20 MG tablet TAKE 1 TABLET BY MOUTH ONCE DAILY 90 tablet 2   No facility-administered medications prior to visit.     Allergies  Allergen Reactions  . Pravastatin Anaphylaxis    myalgias  . Codeine Other (See Comments)    Review of Systems  Constitutional: Negative for fever and malaise/fatigue.  HENT: Negative for congestion.   Eyes: Negative for blurred vision.  Respiratory: Negative for shortness of breath.   Cardiovascular: Negative for chest pain, palpitations and leg swelling.  Gastrointestinal: Negative for abdominal pain, blood in stool and nausea.  Genitourinary: Negative for dysuria and frequency.  Musculoskeletal: Negative for falls.  Skin: Negative for rash.  Neurological: Negative for dizziness, loss of consciousness and headaches.  Endo/Heme/Allergies: Negative for environmental allergies.  Psychiatric/Behavioral: Negative for depression. The patient is not nervous/anxious.        Objective:    Physical Exam  Constitutional: She is oriented to person, place, and time. She appears well-developed and well-nourished. No distress.  HENT:  Head: Normocephalic and atraumatic.  Nose: Nose normal.  Eyes: Right eye exhibits no discharge. Left eye exhibits no discharge.  Neck: Normal range of motion. Neck supple.  Cardiovascular: Normal rate and regular rhythm.  No murmur heard. Pulmonary/Chest: Effort normal and breath sounds normal.  Abdominal: Soft. Bowel sounds are normal. There is no tenderness.  Musculoskeletal: She exhibits no edema.  Neurological: She is alert and oriented to person, place, and time.  Skin: Skin is warm and dry.    Psychiatric: She has a normal mood and affect.  Nursing note and vitals reviewed.   BP 128/72 (BP Location: Right Arm, Patient Position: Sitting, Cuff Size: Normal)   Pulse (!) 55   Resp 16   Ht 4\' 10"  (1.473 m)   Wt 129 lb 3.2 oz (58.6 kg)   SpO2 99%   BMI 27.00 kg/m  Wt Readings from Last 3 Encounters:  05/07/17 129 lb 3.2 oz (58.6 kg)  11/06/16 123 lb (55.8 kg)  08/31/16 125 lb (56.7 kg)     Lab Results  Component Value  Date   WBC 10.4 11/06/2016   HGB 13.9 11/06/2016   HCT 42.4 11/06/2016   PLT 418.0 (H) 11/06/2016   GLUCOSE 106 (H) 11/06/2016   CHOL 189 11/06/2016   TRIG 164.0 (H) 11/06/2016   HDL 65.80 11/06/2016   LDLDIRECT 135.0 05/08/2016   LDLCALC 91 11/06/2016   ALT 17 11/06/2016   AST 18 11/06/2016   NA 142 11/06/2016   K 4.8 11/06/2016   CL 106 11/06/2016   CREATININE 0.69 11/06/2016   BUN 11 11/06/2016   CO2 28 11/06/2016   TSH 1.24 11/06/2016    Lab Results  Component Value Date   TSH 1.24 11/06/2016   Lab Results  Component Value Date   WBC 10.4 11/06/2016   HGB 13.9 11/06/2016   HCT 42.4 11/06/2016   MCV 97.5 11/06/2016   PLT 418.0 (H) 11/06/2016   Lab Results  Component Value Date   NA 142 11/06/2016   K 4.8 11/06/2016   CO2 28 11/06/2016   GLUCOSE 106 (H) 11/06/2016   BUN 11 11/06/2016   CREATININE 0.69 11/06/2016   BILITOT 0.6 11/06/2016   ALKPHOS 62 11/06/2016   AST 18 11/06/2016   ALT 17 11/06/2016   PROT 7.6 11/06/2016   ALBUMIN 4.7 11/06/2016   CALCIUM 10.9 (H) 11/06/2016   GFR 89.51 11/06/2016   Lab Results  Component Value Date   CHOL 189 11/06/2016   Lab Results  Component Value Date   HDL 65.80 11/06/2016   Lab Results  Component Value Date   LDLCALC 91 11/06/2016   Lab Results  Component Value Date   TRIG 164.0 (H) 11/06/2016   Lab Results  Component Value Date   CHOLHDL 3 11/06/2016   No results found for: HGBA1C     Assessment & Plan:   Problem List Items Addressed This Visit    HTN  (hypertension)    Well controlled, no changes to meds. Encouraged heart healthy diet such as the DASH diet and exercise as tolerated.       Relevant Orders   CBC   Comprehensive metabolic panel   TSH   Hyperlipidemia    Encouraged heart healthy diet, increase exercise, avoid trans fats, consider a krill oil cap daily      Relevant Orders   Lipid panel   Allergic rhinitis    Has flared recently is using Flonase with some relief. She has continued to have a cough s/p her chemical inhalation last year. Is given Albuterol to use prn to see if it helps      Hyperglycemia     minimize simple carbs. Increase exercise as tolerated.      Relevant Orders   Hemoglobin A1c      I am having Maymuna Tuel start on albuterol. I am also having her maintain her fish oil-omega-3 fatty acids, multivitamin with minerals, calcium-vitamin D, loratadine, losartan, and rosuvastatin.  Meds ordered this encounter  Medications  . albuterol (PROVENTIL HFA;VENTOLIN HFA) 108 (90 Base) MCG/ACT inhaler    Sig: Inhale 2 puffs into the lungs every 6 (six) hours as needed for wheezing or shortness of breath.    Dispense:  1 Inhaler    Refill:  0    CMA served as scribe during this visit. History, Physical and Plan performed by medical provider. Documentation and orders reviewed and attested to.  Penni Homans, MD

## 2017-05-07 NOTE — Patient Instructions (Addendum)
Robin [name] can pick up at [employer] [employer] Carbohydrate Counting Carbohydrate counting is a method for keeping track of how many carbohydrates you eat. Eating carbohydrates naturally increases the amount of sugar (glucose) in the blood. Counting how many carbohydrates you eat helps keep your blood glucose within normal limits, which helps you manage your diabetes (diabetes mellitus). It is important to know how many carbohydrates you can safely have in each meal. This is different for every person. A diet and nutrition specialist (registered dietitian) can help you make a meal plan and calculate how many carbohydrates you should have at each meal and snack. Carbohydrates are found in the following foods:  Grains, such as breads and cereals.  Dried beans and soy products.  Starchy vegetables, such as potatoes, peas, and corn.  Fruit and fruit juices.  Milk and yogurt.  Sweets and snack foods, such as cake, cookies, candy, chips, and soft drinks.  How do I count carbohydrates? There are two ways to count carbohydrates in food. You can use either of the methods or a combination of both. Reading "Nutrition Facts" on packaged food The "Nutrition Facts" list is included on the labels of almost all packaged foods and beverages in the [place] It includes:  The serving size.  Information about nutrients in each serving, including the grams (g) of carbohydrate per serving.  To use the "Nutrition Facts":  Decide how many servings you will have.  Multiply the number of servings by the number of carbohydrates per serving.  The resulting number is the total amount of carbohydrates that you will be having.  Learning standard serving sizes of other foods When you eat foods containing carbohydrates that are not packaged or do not include "Nutrition Facts" on the label, you need to measure the servings in order to count the amount of carbohydrates:  Measure the  foods that you will eat with a food scale or measuring cup, if needed.  Decide how many standard-size servings you will eat.  Multiply the number of servings by 15. Most carbohydrate-rich foods have about 15 g of carbohydrates per serving. ? For example, if you eat 8 oz (170 g) of strawberries, you will have eaten 2 servings and 30 g of carbohydrates (2 servings x 15 g = 30 g).  For foods that have more than one food mixed, such as soups and casseroles, you must count the carbohydrates in each food that is included.  The following list contains standard serving sizes of common carbohydrate-rich foods. Each of these servings has about 15 g of carbohydrates:   hamburger bun or  English muffin.   oz (15 mL) syrup.   oz (14 g) jelly.  1 slice of bread.  1 six-inch tortilla.  3 oz (85 g) cooked rice or pasta.  4 oz (113 g) cooked dried beans.  4 oz (113 g) starchy vegetable, such as peas, corn, or potatoes.  4 oz (113 g) hot cereal.  4 oz (113 g) mashed potatoes or  of a large baked potato.  4 oz (113 g) canned or frozen fruit.  4 oz (120 mL) fruit juice.  4-6 crackers.  6 chicken nuggets.  6 oz (170 g) unsweetened dry cereal.  6 oz (170 g) plain fat-free yogurt or yogurt sweetened with artificial sweeteners.  8 oz (240 mL) milk.  8 oz (170 g) fresh fruit or one small piece of fruit.  24 oz (680 g) popped popcorn.  Example of carbohydrate counting Sample  meal  3 oz (85 g) chicken breast.  6 oz (170 g) brown rice.  4 oz (113 g) corn.  8 oz (240 mL) milk.  8 oz (170 g) strawberries with sugar-free whipped topping. Carbohydrate calculation 1. Identify the foods that contain carbohydrates: ? Rice. ? Corn. ? Milk. ? Strawberries. 2. Calculate how many servings you have of each food: ? 2 servings rice. ? 1 serving corn. ? 1 serving milk. ? 1 serving strawberries. 3. Multiply each number of servings by 15 g: ? 2 servings rice x 15 g = 30 g. ? 1  serving corn x 15 g = 15 g. ? 1 serving milk x 15 g = 15 g. ? 1 serving strawberries x 15 g = 15 g. 4. Add together all of the amounts to find the total grams of carbohydrates eaten: ? 30 g + 15 g + 15 g + 15 g = 75 g of carbohydrates total. This information is not intended to replace advice given to you by your health care provider. Make sure you discuss any questions you have with your health care provider. Document Released: 01/16/2005 Document Revised: 08/06/2015 Document Reviewed: 06/30/2015 Elsevier Interactive Patient Education  [employer].

## 2017-05-07 NOTE — Assessment & Plan Note (Signed)
minimize simple carbs. Increase exercise as tolerated.  

## 2017-05-08 ENCOUNTER — Other Ambulatory Visit (INDEPENDENT_AMBULATORY_CARE_PROVIDER_SITE_OTHER): Payer: Medicare Other

## 2017-05-08 LAB — VITAMIN D 25 HYDROXY (VIT D DEFICIENCY, FRACTURES): VITD: 30.62 ng/mL (ref 30.00–100.00)

## 2017-05-14 DIAGNOSIS — Z9841 Cataract extraction status, right eye: Secondary | ICD-10-CM | POA: Diagnosis not present

## 2017-05-14 DIAGNOSIS — H2511 Age-related nuclear cataract, right eye: Secondary | ICD-10-CM | POA: Diagnosis not present

## 2017-06-06 ENCOUNTER — Ambulatory Visit (INDEPENDENT_AMBULATORY_CARE_PROVIDER_SITE_OTHER): Payer: Medicare Other | Admitting: Family Medicine

## 2017-06-06 ENCOUNTER — Ambulatory Visit (HOSPITAL_BASED_OUTPATIENT_CLINIC_OR_DEPARTMENT_OTHER)
Admission: RE | Admit: 2017-06-06 | Discharge: 2017-06-06 | Disposition: A | Discharge status: Home / Self Care | Payer: Medicare Other | Source: Ambulatory Visit | Attending: Family Medicine | Admitting: Family Medicine

## 2017-06-06 ENCOUNTER — Encounter: Payer: Self-pay | Admitting: Family Medicine

## 2017-06-06 VITALS — BP 124/84 | HR 83 | Temp 98.3°F | Wt 127.6 lb

## 2017-06-06 DIAGNOSIS — R509 Fever, unspecified: Secondary | ICD-10-CM

## 2017-06-06 DIAGNOSIS — R059 Cough, unspecified: Secondary | ICD-10-CM

## 2017-06-06 DIAGNOSIS — J209 Acute bronchitis, unspecified: Secondary | ICD-10-CM | POA: Diagnosis not present

## 2017-06-06 DIAGNOSIS — J40 Bronchitis, not specified as acute or chronic: Secondary | ICD-10-CM | POA: Diagnosis not present

## 2017-06-06 DIAGNOSIS — R05 Cough: Secondary | ICD-10-CM

## 2017-06-06 DIAGNOSIS — I700 Atherosclerosis of aorta: Secondary | ICD-10-CM | POA: Diagnosis not present

## 2017-06-06 DIAGNOSIS — R079 Chest pain, unspecified: Secondary | ICD-10-CM | POA: Diagnosis not present

## 2017-06-06 MED ORDER — DOXYCYCLINE HYCLATE 100 MG PO CAPS: 100.0000 mg | ORAL_CAPSULE | Freq: Two times a day (BID) | ORAL | 0 refills | Status: DC

## 2017-06-06 MED ORDER — ALBUTEROL SULFATE HFA 108 (90 BASE) MCG/ACT IN AERS: 2.0000 | INHALATION_SPRAY | Freq: Four times a day (QID) | RESPIRATORY_TRACT | 0 refills | Status: DC | PRN

## 2017-06-06 MED FILL — DOXYCYCLINE HYCLATE 100 MG: 100 | 10 days supply | Qty: 20 | Fill #0

## 2017-06-06 MED FILL — VENTOLIN HFA 90 MCG INHALER: 108 (90 BAS | 25 days supply | Qty: 18 | Fill #0

## 2017-06-06 NOTE — Progress Notes (Signed)
[facility] [place], [place], [place] [address] [phone]  Date:  06/06/2017   Name:  Robin [name]   DOB:  January 07, 1946   MRN:  [id number]  PCP:  Mosie Lukes, MD    Chief Complaint: Cough (c/o dry cough, runny nose, sinus pain and pressure since this past Sunday. )   History of Present Illness:  Robin [name] is a 72 y.o. very pleasant female patient who presents with the following:  History of hyperlipidemia and hypertension Here today with cough for the last 4 days  It seemed to start when she was exposed to a lot of dust at a baseball game- she notes that her lungs tend to be sensitive to inhaled irritants  History of hyperlipidemia and HTN  She also notes sinus pressure and tooth pain for the last 2 days- wonders about a sinus infection  She uses a sinus rinse on a regular basis and she really enjoys gardening and is outside a lot of time She did have a temp of 99.9 this am She has felt poorly- tired, body aches Her energy is not as good as normal since she has been sick  "my husband thinks I have pneumonia" She has been stressed as her husband is undergoing treatment for metastatics prostate cancer  No vomiting or diarrhea  Not eating as well due to poor appetite since she got sick  She has tried delsym, mucinex She also does have an albuterol inhaler- she is having to use this and it does help her I can refill this for her   Patient Active Problem List   Diagnosis Date Noted  . Hyperglycemia 05/07/2017  . Rosacea 11/06/2016  . Chronic right shoulder pain 03/03/2016  . Medicare annual wellness visit, subsequent 05/06/2015  . Preventative health care 06/20/2014  . Grief reaction 06/04/2014  . Leukocytosis 04/14/2013  . HTN (hypertension) 11/26/2012  . Hyperlipidemia 11/26/2012  . DJD (degenerative joint disease) 11/26/2012  . Tobacco use 11/26/2012  . GERD (gastroesophageal reflux disease)  11/26/2012  . Osteopenia 11/26/2012  . Family history of colon cancer 11/26/2012  . Allergic rhinitis 11/26/2012  . Endometriosis 11/26/2012  . Rotator cuff tear, right 11/26/2012    Past Medical History:  Diagnosis Date  . Acute bronchitis 11/06/2015  . Arthritis   . 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 Grief reaction 06/04/2014  . Hyperlipidemia   . Hypertension   . Medicare annual wellness visit, subsequent 06/20/2014  . Medicare annual wellness visit, subsequent 05/06/2015  . Osteopenia   . Preventative health care 06/20/2014   Medication: Review, verify sig & reconcile(including outside meds): Duplicates discarded: DM supply source:  Preferred Pharmacy and which med where:[facility] (MAIL ORDER) [place], [place] 90 day supply/mail order: 90 Local pharmacy: [place], [place]  Allergies verified:UTD  Immunization Status: Prompt  . Rosacea 11/06/2016  . Rotator cuff tear, right 11/26/2012    Past Surgical History:  Procedure Laterality Date  . CATARACT EXTRACTION Right 12/2016  . EYE SURGERY Left 07/08/13  . OOPHORECTOMY Right   . SHOULDER ARTHROSCOPY      Social History   Tobacco Use  . Smoking status: Former Smoker    Packs/day: 0.50    Years: 29.00    Pack years: 14.50    Types: Cigarettes    Start date: 01/03/1983  . Smokeless tobacco:  Never Used  . Tobacco comment: 06-02-13  still smoking  Substance Use Topics  . Alcohol use: Yes    Alcohol/week: 12.6 oz    Types: 21 Glasses of wine per week  . Drug use: No    Family History  Problem Relation Age of Onset  . Cancer Father 38       colon, preCA?  Marland Kitchen Arthritis Father        rheumatoid arthritis  . Heart disease Father        stents  . Stroke Maternal Grandmother   . Hypertension Maternal Grandmother   . Heart disease Paternal Grandfather   . COPD Mother        smoker  . Hypertension Mother   . Arthritis Sister   . Urolithiasis  Sister   . Heart disease Son     Allergies  Allergen Reactions  . Pravastatin Anaphylaxis    myalgias  . Codeine Other (See Comments)    Medication list has been reviewed and updated.  Current Outpatient Medications on File Prior to Visit  Medication Sig Dispense Refill  . albuterol (PROVENTIL HFA;VENTOLIN HFA) 108 (90 Base) MCG/ACT inhaler Inhale 2 puffs into the lungs every 6 (six) hours as needed for wheezing or shortness of breath. 1 Inhaler 0  . calcium-vitamin D (OSCAL WITH D) 500-200 MG-UNIT per tablet Take 1 tablet by mouth.    . fish oil-omega-3 fatty acids 1000 MG capsule Take 1,200 mg by mouth daily.     Marland Kitchen loratadine (CLARITIN) 10 MG tablet Take 1 tablet (10 mg total) by mouth daily. 90 tablet 1  . losartan (COZAAR) 50 MG tablet Take 1 tablet (50 mg total) by mouth daily. 90 tablet 1  . Multiple Vitamins-Minerals (MULTIVITAMIN WITH MINERALS) tablet Take 1 tablet by mouth daily.    . rosuvastatin (CRESTOR) 20 MG tablet TAKE 1 TABLET BY MOUTH ONCE DAILY 90 tablet 2   No current facility-administered medications on file prior to visit.     Review of Systems:  As per HPI- otherwise negative. No chest pain No rash    Physical Examination: Vitals:   06/06/17 1240  BP: 124/84  Pulse: 83  Temp: 98.3 F (36.8 C)  SpO2: 94%   Vitals:   06/06/17 1240  Weight: 127 lb 9.6 oz (57.9 kg)   Body mass index is 26.67 kg/m. Ideal Body Weight:    GEN: WDWN, NAD, Non-toxic, A & O x 3, normal weight, looks well, coughing some in room  HEENT: Atraumatic, Normocephalic. Neck supple. No masses, No LAD.  Bilateral TM wnl, oropharynx normal.  PEERL,EOMI.   Ears and Nose: No external deformity. CV: RRR, No M/G/R. No JVD. No thrill. No extra heart sounds. PULM: CTA B, no wheezes, crackles, rhonchi. No retractions. No resp. distress. No accessory muscle use. EXTR: No c/c/e NEURO Normal gait.  PSYCH: Normally interactive. Conversant. Not depressed or anxious appearing.  Calm  demeanor.   Dg Chest 2 View  Result Date: 06/06/2017 CLINICAL DATA:  Persistent cough with chest pain associated with coughing, several days duration. EXAM: CHEST - 2 VIEW COMPARISON:  04/14/2013 FINDINGS: Heart size is normal. Aortic atherosclerosis. Pulmonary vascularity is normal. No effusions. There is mild central bronchial thickening but no infiltrate, collapse or effusion. Bony structures are unremarkable. IMPRESSION: Bronchitis pattern.  No consolidation or collapse. Aortic atherosclerosis. Electronically Signed   By: Nelson Chimes M.D.   On: 06/06/2017 13:31    Assessment and Plan: Acute bronchitis, unspecified organism - Plan: doxycycline (VIBRAMYCIN)  100 MG capsule, albuterol (PROVENTIL HFA;VENTOLIN HFA) 108 (90 Base) MCG/ACT inhaler  Cough - Plan: DG Chest 2 View, albuterol (PROVENTIL HFA;VENTOLIN HFA) 108 (90 Base) MCG/ACT inhaler  Low grade fever - Plan: DG Chest 2 View  Treat for bronchitis with doxycycline and albuterol- doxy will also cover any sinus infection that may be active She will let me know if not feeling better in the next few days- Sooner if worse.   use albuterol as needed for cough and chest congestion   Signed Lamar Blinks, MD

## 2017-06-06 NOTE — Patient Instructions (Signed)
You have bronchitis- no pneumonia on his chest x-ray today  We are going to treat you with doxycycline antibiotic and also I refilled your albuterol inhaler  Please let me know if you are not feeling better in the next few days- Sooner if worse.

## 2017-06-09 ENCOUNTER — Other Ambulatory Visit: Payer: Self-pay | Admitting: Family Medicine

## 2017-08-30 ENCOUNTER — Other Ambulatory Visit: Payer: Self-pay | Admitting: Family Medicine

## 2017-11-08 ENCOUNTER — Ambulatory Visit (INDEPENDENT_AMBULATORY_CARE_PROVIDER_SITE_OTHER): Payer: Medicare Other | Admitting: Family Medicine

## 2017-11-08 VITALS — BP 134/82 | HR 75 | Temp 98.6°F | Resp 18 | Ht 59.0 in | Wt 129.0 lb

## 2017-11-08 DIAGNOSIS — I10 Essential (primary) hypertension: Secondary | ICD-10-CM

## 2017-11-08 DIAGNOSIS — E785 Hyperlipidemia, unspecified: Secondary | ICD-10-CM | POA: Diagnosis not present

## 2017-11-08 DIAGNOSIS — M8580 Other specified disorders of bone density and structure, unspecified site: Secondary | ICD-10-CM | POA: Diagnosis not present

## 2017-11-08 DIAGNOSIS — Z87891 Personal history of nicotine dependence: Secondary | ICD-10-CM | POA: Diagnosis not present

## 2017-11-08 DIAGNOSIS — Z1239 Encounter for other screening for malignant neoplasm of breast: Secondary | ICD-10-CM | POA: Diagnosis not present

## 2017-11-08 DIAGNOSIS — Z23 Encounter for immunization: Secondary | ICD-10-CM

## 2017-11-08 DIAGNOSIS — F4321 Adjustment disorder with depressed mood: Secondary | ICD-10-CM

## 2017-11-08 DIAGNOSIS — Z0000 Encounter for general adult medical examination without abnormal findings: Secondary | ICD-10-CM

## 2017-11-08 DIAGNOSIS — F4320 Adjustment disorder, unspecified: Secondary | ICD-10-CM

## 2017-11-08 DIAGNOSIS — K219 Gastro-esophageal reflux disease without esophagitis: Secondary | ICD-10-CM | POA: Diagnosis not present

## 2017-11-08 DIAGNOSIS — E875 Hyperkalemia: Secondary | ICD-10-CM | POA: Diagnosis not present

## 2017-11-08 DIAGNOSIS — R739 Hyperglycemia, unspecified: Secondary | ICD-10-CM | POA: Diagnosis not present

## 2017-11-08 LAB — COMPREHENSIVE METABOLIC PANEL
ALT: 17 U/L (ref 0–35)
AST: 18 U/L (ref 0–37)
Albumin: 4.4 g/dL (ref 3.5–5.2)
Alkaline Phosphatase: 65 U/L (ref 39–117)
BUN: 10 mg/dL (ref 6–23)
CALCIUM: 10.4 mg/dL (ref 8.4–10.5)
CHLORIDE: 108 meq/L (ref 96–112)
CO2: 30 meq/L (ref 19–32)
Creatinine, Ser: 0.71 mg/dL (ref 0.40–1.20)
GFR: 86.35 mL/min (ref >60.00)
Glucose, Bld: 99 mg/dL (ref 70–99)
Potassium: 5.5 mEq/L — ABNORMAL HIGH (ref 3.5–5.1)
SODIUM: 143 meq/L (ref 135–145)
Total Bilirubin: 0.5 mg/dL (ref 0.2–1.2)
Total Protein: 7 g/dL (ref 6.0–8.3)

## 2017-11-08 LAB — LIPID PANEL
CHOL/HDL RATIO: 3
Cholesterol: 167 mg/dL (ref 0–200)
HDL: 64.2 mg/dL (ref >39.00)
LDL CALC: 73 mg/dL (ref 0–99)
NonHDL: 102.58
Triglycerides: 146 mg/dL (ref 0.0–149.0)
VLDL: 29.2 mg/dL (ref 0.0–40.0)

## 2017-11-08 LAB — TSH: TSH: 1.66 u[IU]/mL (ref 0.35–4.50)

## 2017-11-08 LAB — CBC
HCT: 40.3 % (ref 36.0–46.0)
Hemoglobin: 13.1 g/dL (ref 12.0–15.0)
MCHC: 32.5 g/dL (ref 30.0–36.0)
MCV: 95.6 fl (ref 78.0–100.0)
Platelets: 406 10*3/uL — ABNORMAL HIGH (ref 150.0–400.0)
RBC: 4.21 Mil/uL (ref 3.87–5.11)
RDW: 14 % (ref 11.5–15.5)
WBC: 12.3 10*3/uL — ABNORMAL HIGH (ref 4.0–10.5)

## 2017-11-08 LAB — HEMOGLOBIN A1C: Hgb A1c MFr Bld: 5.7 % (ref 4.6–6.5)

## 2017-11-08 MED ORDER — LOSARTAN POTASSIUM 50 MG PO TABS: 50.0000 mg | ORAL_TABLET | Freq: Every day | ORAL | 1 refills | Status: DC

## 2017-11-08 MED ORDER — ROSUVASTATIN CALCIUM 20 MG PO TABS: 20.0000 mg | ORAL_TABLET | Freq: Every day | ORAL | 2 refills | Status: DC

## 2017-11-08 NOTE — Assessment & Plan Note (Signed)
Well controlled, no changes to meds. Encouraged heart healthy diet such as the DASH diet and exercise as tolerated.  °

## 2017-11-08 NOTE — Assessment & Plan Note (Signed)
Encouraged heart healthy diet, increase exercise, avoid trans fats, consider a krill oil cap daily 

## 2017-11-08 NOTE — Assessment & Plan Note (Signed)
Encouraged to get adequate exercise, calcium and vitamin d intake 

## 2017-11-08 NOTE — Progress Notes (Signed)
Subjective:    Patient ID: Robin [name], female    DOB: 1945/11/03, 72 y.o.   MRN: [id number]  No chief complaint on file.   HPI Patient is in today for annual preventative exam and follow up on chronic medical concerns such as hyperglycemia, hyperlipidemia and reflux. She feels well today. 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 No recent febrile illness or hospitalizations. She stays active and tries to maintain a heart healthy diet but sometimes falls short. She manages her activities of daily living well. Denies CP/palp/SOB/HA/congestion/fevers/GI or GU c/o. Taking meds as prescribed  Past Medical History:  Diagnosis Date  . Acute bronchitis 11/06/2015  . Arthritis   . 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 Grief reaction 06/04/2014  . Hyperlipidemia   . Hypertension   . Medicare annual wellness visit, subsequent 06/20/2014  . Medicare annual wellness visit, subsequent 05/06/2015  . Osteopenia   . Preventative health care 06/20/2014   Medication: Review, verify sig & reconcile(including outside meds): Duplicates discarded: DM supply source:  Preferred Pharmacy and which med where:[facility] (MAIL ORDER) [facility] [place] 90 day supply/mail order: 90 Local pharmacy: [place], [place]  Allergies verified:UTD  Immunization Status: Prompt  . Rosacea 11/06/2016  . Rotator cuff tear, right 11/26/2012    Past Surgical History:  Procedure Laterality Date  . CATARACT EXTRACTION Right 12/2016  . EYE SURGERY Left 07/08/13  . OOPHORECTOMY Right   . SHOULDER ARTHROSCOPY      Family History  Problem Relation Age of Onset  . Cancer Father 75       colon, preCA?  Marland Kitchen Arthritis Father        rheumatoid arthritis  . Heart disease Father        stents  . Stroke Maternal Grandmother   .  Hypertension Maternal Grandmother   . Heart disease Paternal Grandfather   . COPD Mother        smoker  . Hypertension Mother   . Arthritis Sister   . Urolithiasis Sister   . Heart disease Son     Social History   Socioeconomic History  . Marital status: Married    Spouse name: Not on file  . Number of children: Not on file  . Years of education: Not on file  . Highest education level: Not on file  Occupational History  . Not on file  Social Needs  . Financial resource strain: Not on file  . Food insecurity:    Worry: Not on file    Inability: Not on file  . Transportation needs:    Medical: Not on file    Non-medical: Not on file  Tobacco Use  . Smoking status: Former Smoker    Packs/day: 0.50    Years: 29.00    Pack years: 14.50    Types: Cigarettes    Start date: 01/03/1983  . Smokeless tobacco: Never Used  . Tobacco comment: 06-02-13  still smoking  Substance and Sexual Activity  . Alcohol use: Yes    Alcohol/week: 21.0 standard drinks    Types: 21 Glasses of wine per week  . Drug use: No  . Sexual activity: Never    Comment: no dietary restrictions, smokes, lives with husband   Lifestyle  . Physical activity:  Days per week: Not on file    Minutes per session: Not on file  . Stress: Not on file  Relationships  . Social connections:    Talks on phone: Not on file    Gets together: Not on file    Attends religious service: Not on file    Active member of club or organization: Not on file    Attends meetings of clubs or organizations: Not on file    Relationship status: Not on file  . Intimate partner violence:    Fear of current or ex partner: Not on file    Emotionally abused: Not on file    Physically abused: Not on file    Forced sexual activity: Not on file  Other Topics Concern  . Not on file  Social History Narrative  . Not on file    Outpatient Medications Prior to Visit  Medication Sig Dispense Refill  . calcium-vitamin D (OSCAL WITH D)  500-200 MG-UNIT per tablet Take 1 tablet by mouth.    . fish oil-omega-3 fatty acids 1000 MG capsule Take 1,200 mg by mouth daily.     . Multiple Vitamins-Minerals (MULTIVITAMIN WITH MINERALS) tablet Take 1 tablet by mouth daily.    Marland Kitchen albuterol (PROVENTIL HFA;VENTOLIN HFA) 108 (90 Base) MCG/ACT inhaler Inhale 2 puffs into the lungs every 6 (six) hours as needed for wheezing or shortness of breath. 1 Inhaler 0  . doxycycline (VIBRAMYCIN) 100 MG capsule Take 1 capsule (100 mg total) by mouth 2 (two) times daily. 20 capsule 0  . EQ ALLERGY RELIEF 10 MG tablet TAKE 1 TABLET BY MOUTH ONCE DAILY 90 tablet 1  . losartan (COZAAR) 50 MG tablet TAKE 1 TABLET BY MOUTH ONCE DAILY 90 tablet 1  . rosuvastatin (CRESTOR) 20 MG tablet TAKE 1 TABLET BY MOUTH ONCE DAILY 90 tablet 2   No facility-administered medications prior to visit.     Allergies  Allergen Reactions  . Pravastatin Anaphylaxis    myalgias  . Codeine Other (See Comments)    Review of Systems  Constitutional: Negative for chills, fever and malaise/fatigue.  HENT: Negative for congestion and hearing loss.   Eyes: Negative for discharge.  Respiratory: Negative for cough, sputum production and shortness of breath.   Cardiovascular: Negative for chest pain, palpitations and leg swelling.  Gastrointestinal: Negative for abdominal pain, blood in stool, constipation, diarrhea, heartburn, nausea and vomiting.  Genitourinary: Negative for dysuria, frequency, hematuria and urgency.  Musculoskeletal: Negative for back pain, falls and myalgias.  Skin: Negative for rash.  Neurological: Negative for dizziness, sensory change, loss of consciousness, weakness and headaches.  Endo/Heme/Allergies: Negative for environmental allergies. Does not bruise/bleed easily.  Psychiatric/Behavioral: Negative for depression and suicidal ideas. The patient is not nervous/anxious and does not have insomnia.        Objective:    Physical Exam  Constitutional: She  is oriented to person, place, and time. No distress.  HENT:  Head: Normocephalic and atraumatic.  Right Ear: External ear normal.  Left Ear: External ear normal.  Nose: Nose normal.  Mouth/Throat: Oropharynx is clear and moist. No oropharyngeal exudate.  Eyes: Pupils are equal, round, and reactive to light. Conjunctivae are normal. Right eye exhibits no discharge. Left eye exhibits no discharge. No scleral icterus.  Neck: Normal range of motion. Neck supple. No thyromegaly present.  Cardiovascular: Normal rate, regular rhythm, normal heart sounds and intact distal pulses.  No murmur heard. Pulmonary/Chest: Effort normal and breath sounds normal. No respiratory distress. She  has no wheezes. She has no rales.  Abdominal: Soft. Bowel sounds are normal. She exhibits no distension and no mass. There is no tenderness.  Musculoskeletal: Normal range of motion. She exhibits no edema or tenderness.  Lymphadenopathy:    She has no cervical adenopathy.  Neurological: She is alert and oriented to person, place, and time. She has normal reflexes. She displays normal reflexes. No cranial nerve deficit. Coordination normal.  Skin: Skin is warm and dry. No rash noted. She is not diaphoretic.    BP 134/82   Pulse 75   Temp 98.6 F (37 C) (Oral)   Resp 18   Ht 4\' 11"  (1.499 m)   Wt 129 lb (58.5 kg)   SpO2 96%   BMI 26.05 kg/m  Wt Readings from Last 3 Encounters:  11/08/17 129 lb (58.5 kg)  06/06/17 127 lb 9.6 oz (57.9 kg)  05/07/17 129 lb 3.2 oz (58.6 kg)     Lab Results  Component Value Date   WBC 12.3 (H) 11/08/2017   HGB 13.1 11/08/2017   HCT 40.3 11/08/2017   PLT 406.0 (H) 11/08/2017   GLUCOSE 99 11/08/2017   CHOL 167 11/08/2017   TRIG 146.0 11/08/2017   HDL 64.20 11/08/2017   LDLDIRECT 135.0 05/08/2016   LDLCALC 73 11/08/2017   ALT 17 11/08/2017   AST 18 11/08/2017   NA 143 11/08/2017   K 5.5 (H) 11/08/2017   CL 108 11/08/2017   CREATININE 0.71 11/08/2017   BUN 10 11/08/2017    CO2 30 11/08/2017   TSH 1.66 11/08/2017   HGBA1C 5.7 11/08/2017    Lab Results  Component Value Date   TSH 1.66 11/08/2017   Lab Results  Component Value Date   WBC 12.3 (H) 11/08/2017   HGB 13.1 11/08/2017   HCT 40.3 11/08/2017   MCV 95.6 11/08/2017   PLT 406.0 (H) 11/08/2017   Lab Results  Component Value Date   NA 143 11/08/2017   K 5.5 (H) 11/08/2017   CO2 30 11/08/2017   GLUCOSE 99 11/08/2017   BUN 10 11/08/2017   CREATININE 0.71 11/08/2017   BILITOT 0.5 11/08/2017   ALKPHOS 65 11/08/2017   AST 18 11/08/2017   ALT 17 11/08/2017   PROT 7.0 11/08/2017   ALBUMIN 4.4 11/08/2017   CALCIUM 10.4 11/08/2017   GFR 86.35 11/08/2017   Lab Results  Component Value Date   CHOL 167 11/08/2017   Lab Results  Component Value Date   HDL 64.20 11/08/2017   Lab Results  Component Value Date   LDLCALC 73 11/08/2017   Lab Results  Component Value Date   TRIG 146.0 11/08/2017   Lab Results  Component Value Date   CHOLHDL 3 11/08/2017   Lab Results  Component Value Date   HGBA1C 5.7 11/08/2017       Assessment & Plan:   Problem List Items Addressed This Visit    HTN (hypertension)    Well controlled, no changes to meds. Encouraged heart healthy diet such as the DASH diet and exercise as tolerated.       Relevant Medications   losartan (COZAAR) 50 MG tablet   rosuvastatin (CRESTOR) 20 MG tablet   Other Relevant Orders   CBC (Completed)   TSH (Completed)   Hyperlipidemia    Encouraged heart healthy diet, increase exercise, avoid trans fats, consider a krill oil cap daily      Relevant Medications   losartan (COZAAR) 50 MG tablet   rosuvastatin (CRESTOR) 20 MG  tablet   Other Relevant Orders   Lipid panel (Completed)   History of tobacco use disorder    Still abstains      GERD (gastroesophageal reflux disease)    Avoid offending foods, start probiotics. Do not eat large meals in late evening and consider raising head of bed.       Osteopenia     Encouraged to get adequate exercise, calcium and vitamin d intake      Grief reaction    Struggling with her husband's end stage Prostate Cancer. She is his care giver and works very hard.but she manages well      Preventative health care    Patient encouraged to maintain heart healthy diet, regular exercise, adequate sleep. Consider daily probiotics. Take medications as prescribed. Labs reviewed      Hyperglycemia    hgba1c acceptable, minimize simple carbs. Increase exercise as tolerated.       Relevant Orders   Hemoglobin A1c (Completed)   Comprehensive metabolic panel (Completed)   Hyperkalemia    Asymptomatic, encouraged to minimize K in diet and monitor       Other Visit Diagnoses    Breast cancer screening    -  Primary   Relevant Orders   MM DIAG BREAST TOMO BILATERAL      I have discontinued Manvi Bortle's doxycycline, albuterol, and EQ ALLERGY RELIEF. I have also changed her losartan and rosuvastatin. Additionally, I am having her maintain her fish oil-omega-3 fatty acids, multivitamin with minerals, and calcium-vitamin D.  Meds ordered this encounter  Medications  . losartan (COZAAR) 50 MG tablet    Sig: Take 1 tablet (50 mg total) by mouth daily.    Dispense:  90 tablet    Refill:  1  . rosuvastatin (CRESTOR) 20 MG tablet    Sig: Take 1 tablet (20 mg total) by mouth daily.    Dispense:  90 tablet    Refill:  2     Penni Homans, MD

## 2017-11-08 NOTE — Assessment & Plan Note (Addendum)
Still abstains

## 2017-11-08 NOTE — Assessment & Plan Note (Signed)
Struggling with her husband's end stage Prostate Cancer. She is his care giver and works very hard.but she manages well

## 2017-11-08 NOTE — Assessment & Plan Note (Signed)
hgba1c acceptable, minimize simple carbs. Increase exercise as tolerated.  

## 2017-11-08 NOTE — Patient Instructions (Signed)
Shingrix is the new shingles 2 shots over 2 to 6 months at the pharmacy  Preventive Care 70 Years and Older, Female Preventive care refers to lifestyle choices and visits with your health care provider that can promote health and wellness. What does preventive care include?  A yearly physical exam. This is also called an annual well check.  Dental exams once or twice a year.  Routine eye exams. Ask your health care provider how often you should have your eyes checked.  Personal lifestyle choices, including: ? Daily care of your teeth and gums. ? Regular physical activity. ? Eating a healthy diet. ? Avoiding tobacco and drug use. ? Limiting alcohol use. ? Practicing safe sex. ? Taking low-dose aspirin every day. ? Taking vitamin and mineral supplements as recommended by your health care provider. What happens during an annual well check? The services and screenings done by your health care provider during your annual well check will depend on your age, overall health, lifestyle risk factors, and family history of disease. Counseling Your health care provider may ask you questions about your:  Alcohol use.  Tobacco use.  Drug use.  Emotional well-being.  Home and relationship well-being.  Sexual activity.  Eating habits.  History of falls.  Memory and ability to understand (cognition).  Work and work Statistician.  Reproductive health.  Screening You may have the following tests or measurements:  Height, weight, and BMI.  Blood pressure.  Lipid and cholesterol levels. These may be checked every 5 years, or more frequently if you are over 70 years old.  Skin check.  Lung cancer screening. You may have this screening every year starting at age 27 if you have a 30-pack-year history of smoking and currently smoke or have quit within the past 15 years.  Fecal occult blood test (FOBT) of the stool. You may have this test every year starting at age 70.  Flexible  sigmoidoscopy or colonoscopy. You may have a sigmoidoscopy every 5 years or a colonoscopy every 10 years starting at age 27.  Hepatitis C blood test.  Hepatitis B blood test.  Sexually transmitted disease (STD) testing.  Diabetes screening. This is done by checking your blood sugar (glucose) after you have not eaten for a while (fasting). You may have this done every 1-3 years.  Bone density scan. This is done to screen for osteoporosis. You may have this done starting at age 70.  Mammogram. This may be done every 1-2 years. Talk to your health care provider about how often you should have regular mammograms.  Talk with your health care provider about your test results, treatment options, and if necessary, the need for more tests. Vaccines Your health care provider may recommend certain vaccines, such as:  Influenza vaccine. This is recommended every year.  Tetanus, diphtheria, and acellular pertussis (Tdap, Td) vaccine. You may need a Td booster every 10 years.  Varicella vaccine. You may need this if you have not been vaccinated.  Zoster vaccine. You may need this after age 70.  Measles, mumps, and rubella (MMR) vaccine. You may need at least one dose of MMR if you were born in 1957 or later. You may also need a second dose.  Pneumococcal 13-valent conjugate (PCV13) vaccine. One dose is recommended after age 70.  Pneumococcal polysaccharide (PPSV23) vaccine. One dose is recommended after age 70.  Meningococcal vaccine. You may need this if you have certain conditions.  Hepatitis A vaccine. You may need this if you have certain  conditions or if you travel or work in places where you may be exposed to hepatitis A.  Hepatitis B vaccine. You may need this if you have certain conditions or if you travel or work in places where you may be exposed to hepatitis B.  Haemophilus influenzae type b (Hib) vaccine. You may need this if you have certain conditions.  Talk to your health  care provider about which screenings and vaccines you need and how often you need them. This information is not intended to replace advice given to you by your health care provider. Make sure you discuss any questions you have with your health care provider. Document Released: 02/12/2015 Document Revised: 10/06/2015 Document Reviewed: 11/17/2014 Elsevier Interactive Patient Education  [employer].

## 2017-11-08 NOTE — Assessment & Plan Note (Signed)
Patient encouraged to maintain heart healthy diet, regular exercise, adequate sleep. Consider daily probiotics. Take medications as prescribed. Labs reviewed 

## 2017-11-08 NOTE — Assessment & Plan Note (Signed)
Avoid offending foods, start probiotics. Do not eat large meals in late evening and consider raising head of bed.  

## 2017-11-11 DIAGNOSIS — E875 Hyperkalemia: Secondary | ICD-10-CM | POA: Insufficient documentation

## 2017-11-11 NOTE — Assessment & Plan Note (Signed)
Asymptomatic, encouraged to minimize K in diet and monitor

## 2017-11-14 ENCOUNTER — Other Ambulatory Visit: Payer: Self-pay

## 2017-11-14 DIAGNOSIS — D72829 Elevated white blood cell count, unspecified: Secondary | ICD-10-CM

## 2017-11-14 DIAGNOSIS — E875 Hyperkalemia: Secondary | ICD-10-CM

## 2017-11-19 ENCOUNTER — Telehealth: Payer: Self-pay | Admitting: *Deleted

## 2017-11-19 NOTE — Telephone Encounter (Signed)
Received Medical records from [facility]; forwarded to provider/[facility] 10/21

## 2017-11-21 ENCOUNTER — Other Ambulatory Visit (INDEPENDENT_AMBULATORY_CARE_PROVIDER_SITE_OTHER): Payer: Medicare Other

## 2017-11-21 DIAGNOSIS — D72829 Elevated white blood cell count, unspecified: Secondary | ICD-10-CM

## 2017-11-21 DIAGNOSIS — E875 Hyperkalemia: Secondary | ICD-10-CM | POA: Diagnosis not present

## 2017-11-21 LAB — COMPREHENSIVE METABOLIC PANEL
ALK PHOS: 68 U/L (ref 39–117)
ALT: 17 U/L (ref 0–35)
AST: 16 U/L (ref 0–37)
Albumin: 4.5 g/dL (ref 3.5–5.2)
BUN: 13 mg/dL (ref 6–23)
CALCIUM: 10 mg/dL (ref 8.4–10.5)
CO2: 28 mEq/L (ref 19–32)
Chloride: 106 mEq/L (ref 96–112)
Creatinine, Ser: 0.77 mg/dL (ref 0.40–1.20)
GFR: 78.63 mL/min (ref >60.00)
GLUCOSE: 92 mg/dL (ref 70–99)
Potassium: 4.3 mEq/L (ref 3.5–5.1)
Sodium: 140 mEq/L (ref 135–145)
TOTAL PROTEIN: 6.9 g/dL (ref 6.0–8.3)
Total Bilirubin: 0.4 mg/dL (ref 0.2–1.2)

## 2017-11-21 LAB — CBC WITH DIFFERENTIAL/PLATELET
BASOS PCT: 0.9 % (ref 0.0–3.0)
Basophils Absolute: 0.1 10*3/uL (ref 0.0–0.1)
Eosinophils Absolute: 0.2 10*3/uL (ref 0.0–0.7)
Eosinophils Relative: 1.7 % (ref 0.0–5.0)
HCT: 40.4 % (ref 36.0–46.0)
Hemoglobin: 13.5 g/dL (ref 12.0–15.0)
LYMPHS ABS: 2.8 10*3/uL (ref 0.7–4.0)
Lymphocytes Relative: 25.4 % (ref 12.0–46.0)
MCHC: 33.4 g/dL (ref 30.0–36.0)
MCV: 95.7 fl (ref 78.0–100.0)
MONO ABS: 0.8 10*3/uL (ref 0.1–1.0)
MONOS PCT: 7.5 % (ref 3.0–12.0)
NEUTROS PCT: 64.5 % (ref 43.0–77.0)
Neutro Abs: 7 10*3/uL (ref 1.4–7.7)
Platelets: 390 10*3/uL (ref 150.0–400.0)
RBC: 4.22 Mil/uL (ref 3.87–5.11)
RDW: 13.8 % (ref 11.5–15.5)
WBC: 10.9 10*3/uL — ABNORMAL HIGH (ref 4.0–10.5)

## 2017-12-17 ENCOUNTER — Other Ambulatory Visit: Payer: Self-pay

## 2018-03-19 ENCOUNTER — Telehealth: Payer: Self-pay

## 2018-03-19 DIAGNOSIS — H938X9 Other specified disorders of ear, unspecified ear: Secondary | ICD-10-CM

## 2018-03-19 NOTE — Telephone Encounter (Signed)
Copied from [facility] [id number]. Topic: Referral - Request for Referral >> Mar 18, 2018  4:52 PM Bea Graff, NT wrote: Has patient seen PCP for this complaint? Yes.   *If NO, is insurance requiring patient see PCP for this issue before PCP can refer them? Referral for which specialty: ENT Preferred provider/office: any within her insurance network Reason for referral: "stopped up ears"

## 2018-04-11 DIAGNOSIS — R448 Other symptoms and signs involving general sensations and perceptions: Secondary | ICD-10-CM | POA: Diagnosis not present

## 2018-04-11 DIAGNOSIS — Z87891 Personal history of nicotine dependence: Secondary | ICD-10-CM | POA: Diagnosis not present

## 2018-04-11 DIAGNOSIS — H938X3 Other specified disorders of ear, bilateral: Secondary | ICD-10-CM | POA: Diagnosis not present

## 2018-05-10 ENCOUNTER — Ambulatory Visit: Payer: Medicare Other | Admitting: Family Medicine

## 2018-05-14 ENCOUNTER — Other Ambulatory Visit: Payer: Self-pay

## 2018-05-14 ENCOUNTER — Ambulatory Visit (INDEPENDENT_AMBULATORY_CARE_PROVIDER_SITE_OTHER): Payer: Medicare Other | Admitting: Family Medicine

## 2018-05-14 DIAGNOSIS — I10 Essential (primary) hypertension: Secondary | ICD-10-CM

## 2018-05-14 DIAGNOSIS — R739 Hyperglycemia, unspecified: Secondary | ICD-10-CM | POA: Diagnosis not present

## 2018-05-14 DIAGNOSIS — E785 Hyperlipidemia, unspecified: Secondary | ICD-10-CM | POA: Diagnosis not present

## 2018-05-14 DIAGNOSIS — F419 Anxiety disorder, unspecified: Secondary | ICD-10-CM

## 2018-05-14 MED ORDER — LOSARTAN POTASSIUM 50 MG PO TABS: 50.0000 mg | ORAL_TABLET | Freq: Every day | ORAL | 1 refills | Status: DC

## 2018-05-14 MED ORDER — ALPRAZOLAM 0.25 MG PO TABS: 0.1250 mg | ORAL_TABLET | Freq: Two times a day (BID) | ORAL | 0 refills | Status: DC | PRN

## 2018-05-14 MED ORDER — ROSUVASTATIN CALCIUM 20 MG PO TABS: 20.0000 mg | ORAL_TABLET | Freq: Every day | ORAL | 2 refills | Status: DC

## 2018-05-15 DIAGNOSIS — F419 Anxiety disorder, unspecified: Secondary | ICD-10-CM | POA: Insufficient documentation

## 2018-05-15 NOTE — Assessment & Plan Note (Signed)
Patient has recently been widowed and is now struggling with the quarantine. She is managing fairly well all things considered but had used Alprazolam many years ago when she lost her parents and that helped her through. She feels it would help now. She is allowed a small prescription and we will see how that helps. She is warned about the increased risk as we age and she will use very sparingly. Reassess next month or as needed.

## 2018-05-15 NOTE — Assessment & Plan Note (Signed)
She reports good blood pressure control. No changes

## 2018-05-15 NOTE — Progress Notes (Signed)
Virtual Visit via Video Note  I connected with Robin [name] on 05/15/18 at  9:00 AM EDT by a video enabled telemedicine application and verified that I am speaking with the correct person using two identifiers.   I discussed the limitations of evaluation and management by telemedicine and the availability of in person appointments. The patient expressed understanding and agreed to proceed. Robin [name] was able to get patient set up on video platform with patient in her home.    Subjective:    Patient ID: Robin [name], female    DOB: 07/10/1945, 73 y.o.   MRN: [id number]  No chief complaint on file.   HPI Patient is in today for follow up on her chronic medical conditions including hypertension, hyperlipidemia and hyperglycemia and to discuss her increased anxiety with some difficulty concentrating, tremulousness and occasional palpitations since her husband passed away. She is tearful but notes he was suffering so she is at peace for him just lonely and sad at times during this quarantine. No recent febrile illness or hospitalizations. Denies CP/SOB/HA/congestion/fevers/GI or GU c/o. Taking meds as prescribed  Past Medical History:  Diagnosis Date  . Acute bronchitis 11/06/2015  . Arthritis   . 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 Grief reaction 06/04/2014  . Hyperlipidemia   . Hypertension   . Medicare annual wellness visit, subsequent 06/20/2014  . Medicare annual wellness visit, subsequent 05/06/2015  . Osteopenia   . Preventative health care 06/20/2014   Medication: Review, verify sig & reconcile(including outside meds): Duplicates discarded: DM supply source:  Preferred Pharmacy and which med where:[facility] ([facility], [place] 90 day supply/mail order: 90 Local pharmacy: [facility], [place]  Allergies verified:UTD  Immunization Status: Prompt  . Rosacea 11/06/2016  . Rotator cuff tear, right  11/26/2012    Past Surgical History:  Procedure Laterality Date  . CATARACT EXTRACTION Right 12/2016  . EYE SURGERY Left 07/08/13  . OOPHORECTOMY Right   . SHOULDER ARTHROSCOPY      Family History  Problem Relation Age of Onset  . Cancer Father 74       colon, preCA?  Marland Kitchen Arthritis Father        rheumatoid arthritis  . Heart disease Father        stents  . Stroke Maternal Grandmother   . Hypertension Maternal Grandmother   . Heart disease Paternal Grandfather   . COPD Mother        smoker  . Hypertension Mother   . Arthritis Sister   . Urolithiasis Sister   . Heart disease Son     Social History   Socioeconomic History  . Marital status: Married    Spouse name: Not on file  . Number of children: Not on file  . Years of education: Not on file  . Highest education level: Not on file  Occupational History  . Not on file  Social Needs  . Financial resource strain: Not on file  . Food insecurity:    Worry: Not on file    Inability: Not on file  . Transportation needs:    Medical: Not on file    Non-medical: Not on file  Tobacco Use  . Smoking status: Former Smoker    Packs/day: 0.50    Years: 29.00    Pack years: 14.50    Types: Cigarettes    Start date: 01/03/1983  . Smokeless tobacco: Never Used  .  Tobacco comment: 06-02-13  still smoking  Substance and Sexual Activity  . Alcohol use: Yes    Alcohol/week: 21.0 standard drinks    Types: 21 Glasses of wine per week  . Drug use: No  . Sexual activity: Never    Comment: no dietary restrictions, smokes, lives with husband   Lifestyle  . Physical activity:    Days per week: Not on file    Minutes per session: Not on file  . Stress: Not on file  Relationships  . Social connections:    Talks on phone: Not on file    Gets together: Not on file    Attends religious service: Not on file    Active member of club or organization: Not on file    Attends meetings of clubs or organizations: Not on file     Relationship status: Not on file  . Intimate partner violence:    Fear of current or ex partner: Not on file    Emotionally abused: Not on file    Physically abused: Not on file    Forced sexual activity: Not on file  Other Topics Concern  . Not on file  Social History Narrative  . Not on file    Outpatient Medications Prior to Visit  Medication Sig Dispense Refill  . calcium-vitamin D (OSCAL WITH D) 500-200 MG-UNIT per tablet Take 1 tablet by mouth.    . fish oil-omega-3 fatty acids 1000 MG capsule Take 1,200 mg by mouth daily.     . Multiple Vitamins-Minerals (MULTIVITAMIN WITH MINERALS) tablet Take 1 tablet by mouth daily.    Marland Kitchen losartan (COZAAR) 50 MG tablet Take 1 tablet (50 mg total) by mouth daily. 90 tablet 1  . rosuvastatin (CRESTOR) 20 MG tablet Take 1 tablet (20 mg total) by mouth daily. 90 tablet 2   No facility-administered medications prior to visit.     Allergies  Allergen Reactions  . Pravastatin Anaphylaxis    myalgias  . Codeine Other (See Comments)    Review of Systems  Constitutional: Negative for fever and malaise/fatigue.  HENT: Negative for congestion.   Eyes: Negative for blurred vision.  Respiratory: Negative for shortness of breath.   Cardiovascular: Positive for palpitations. Negative for chest pain and leg swelling.  Gastrointestinal: Negative for abdominal pain, blood in stool and nausea.  Genitourinary: Negative for dysuria and frequency.  Musculoskeletal: Negative for falls.  Skin: Negative for rash.  Neurological: Negative for dizziness, loss of consciousness and headaches.  Endo/Heme/Allergies: Negative for environmental allergies.  Psychiatric/Behavioral: Negative for depression and suicidal ideas. The patient is nervous/anxious.        Objective:    Physical Exam Constitutional:      Appearance: Normal appearance.  HENT:     Head: Normocephalic and atraumatic.     Nose: Nose normal.  Pulmonary:     Effort: Pulmonary effort is  normal.  Skin:    General: Skin is dry.  Neurological:     Mental Status: She is alert and oriented to person, place, and time.  Psychiatric:        Mood and Affect: Mood normal.        Behavior: Behavior normal.     There were no vitals taken for this visit. Wt Readings from Last 3 Encounters:  11/08/17 129 lb (58.5 kg)  06/06/17 127 lb 9.6 oz (57.9 kg)  05/07/17 129 lb 3.2 oz (58.6 kg)    Diabetic Foot Exam - Simple   No data filed  Lab Results  Component Value Date   WBC 10.9 (H) 11/21/2017   HGB 13.5 11/21/2017   HCT 40.4 11/21/2017   PLT 390.0 11/21/2017   GLUCOSE 92 11/21/2017   CHOL 167 11/08/2017   TRIG 146.0 11/08/2017   HDL 64.20 11/08/2017   LDLDIRECT 135.0 05/08/2016   LDLCALC 73 11/08/2017   ALT 17 11/21/2017   AST 16 11/21/2017   NA 140 11/21/2017   K 4.3 11/21/2017   CL 106 11/21/2017   CREATININE 0.77 11/21/2017   BUN 13 11/21/2017   CO2 28 11/21/2017   TSH 1.66 11/08/2017   HGBA1C 5.7 11/08/2017    Lab Results  Component Value Date   TSH 1.66 11/08/2017   Lab Results  Component Value Date   WBC 10.9 (H) 11/21/2017   HGB 13.5 11/21/2017   HCT 40.4 11/21/2017   MCV 95.7 11/21/2017   PLT 390.0 11/21/2017   Lab Results  Component Value Date   NA 140 11/21/2017   K 4.3 11/21/2017   CO2 28 11/21/2017   GLUCOSE 92 11/21/2017   BUN 13 11/21/2017   CREATININE 0.77 11/21/2017   BILITOT 0.4 11/21/2017   ALKPHOS 68 11/21/2017   AST 16 11/21/2017   ALT 17 11/21/2017   PROT 6.9 11/21/2017   ALBUMIN 4.5 11/21/2017   CALCIUM 10.0 11/21/2017   GFR 78.63 11/21/2017   Lab Results  Component Value Date   CHOL 167 11/08/2017   Lab Results  Component Value Date   HDL 64.20 11/08/2017   Lab Results  Component Value Date   LDLCALC 73 11/08/2017   Lab Results  Component Value Date   TRIG 146.0 11/08/2017   Lab Results  Component Value Date   CHOLHDL 3 11/08/2017   Lab Results  Component Value Date   HGBA1C 5.7 11/08/2017        Assessment & Plan:   Problem List Items Addressed This Visit    HTN (hypertension)    She reports good blood pressure control. No changes      Relevant Medications   losartan (COZAAR) 50 MG tablet   rosuvastatin (CRESTOR) 20 MG tablet   Hyperlipidemia    Encouraged heart healthy diet, increase exercise, avoid trans fats, consider a krill oil cap daily. Tolerating Crestor      Relevant Medications   losartan (COZAAR) 50 MG tablet   rosuvastatin (CRESTOR) 20 MG tablet   Hyperglycemia    hgba1c acceptable, minimize simple carbs. Increase exercise as tolerated.       Anxiety    Patient has recently been widowed and is now struggling with the quarantine. She is managing fairly well all things considered but had used Alprazolam many years ago when she lost her parents and that helped her through. She feels it would help now. She is allowed a small prescription and we will see how that helps. She is warned about the increased risk as we age and she will use very sparingly. Reassess next month or as needed.      Relevant Medications   ALPRAZolam (XANAX) 0.25 MG tablet      I am having Charlotta Sobh start on ALPRAZolam. I am also having her maintain her fish oil-omega-3 fatty acids, multivitamin with minerals, calcium-vitamin D, losartan, and rosuvastatin.  Meds ordered this encounter  Medications  . losartan (COZAAR) 50 MG tablet    Sig: Take 1 tablet (50 mg total) by mouth daily.    Dispense:  90 tablet    Refill:  1  .  rosuvastatin (CRESTOR) 20 MG tablet    Sig: Take 1 tablet (20 mg total) by mouth daily.    Dispense:  90 tablet    Refill:  2  . ALPRAZolam (XANAX) 0.25 MG tablet    Sig: Take 0.5-1 tablets (0.125-0.25 mg total) by mouth 2 (two) times daily as needed for anxiety.    Dispense:  30 tablet    Refill:  0    I discussed the assessment and treatment plan with the patient. The patient was provided an opportunity to ask questions and all were answered. The patient  agreed with the plan and demonstrated an understanding of the instructions.   The patient was advised to call back or seek an in-person evaluation if the symptoms worsen or if the condition fails to improve as anticipated.  I provided 25 minutes of non-face-to-face time during this encounter.   Penni Homans, MD

## 2018-05-15 NOTE — Assessment & Plan Note (Signed)
Encouraged heart healthy diet, increase exercise, avoid trans fats, consider a krill oil cap daily. Tolerating Crestor 

## 2018-05-15 NOTE — Assessment & Plan Note (Signed)
hgba1c acceptable, minimize simple carbs. Increase exercise as tolerated.  

## 2018-06-17 ENCOUNTER — Other Ambulatory Visit: Payer: Self-pay

## 2018-06-17 ENCOUNTER — Ambulatory Visit (INDEPENDENT_AMBULATORY_CARE_PROVIDER_SITE_OTHER): Payer: Medicare Other | Admitting: Family Medicine

## 2018-06-17 ENCOUNTER — Encounter: Payer: Self-pay | Admitting: Family Medicine

## 2018-06-17 DIAGNOSIS — F419 Anxiety disorder, unspecified: Secondary | ICD-10-CM | POA: Diagnosis not present

## 2018-06-17 DIAGNOSIS — E785 Hyperlipidemia, unspecified: Secondary | ICD-10-CM | POA: Diagnosis not present

## 2018-06-17 DIAGNOSIS — R739 Hyperglycemia, unspecified: Secondary | ICD-10-CM | POA: Diagnosis not present

## 2018-06-17 DIAGNOSIS — I10 Essential (primary) hypertension: Secondary | ICD-10-CM

## 2018-06-17 NOTE — Progress Notes (Signed)
Virtual Visit via Video Note  I connected with Robin [name] on 06/17/18 at  8:20 AM EDT by a video enabled telemedicine application and verified that I am speaking with the correct person using two identifiers.  Location: Patient: home Provider: home   I discussed the limitations of evaluation and management by telemedicine and the availability of in person appointments. The patient expressed understanding and agreed to proceed. Robin [name], CMA was able to get patient set up on a video visit.    Subjective:    Patient ID: Robin [name], female    DOB: 1945/08/07, 73 y.o.   MRN: [id number]  Chief Complaint  Patient presents with   Hypertension    Pt states no new concerns.    Hyperlipidemia   Follow-up    HPI Patient is in today for follow up on chronic medical concerns including hypertension, anxiety, hyperlipidemia and more. She feels well today. Despite her grief and the stress of this pandemic she has not needed any doses of Alprazolam. She has been walking 2 miles 5 x a week and trying to maintain a heart healthy diet, cooking more from scratch. She notes her BP is ranging from 875I to 433I systolic. Denies any recent febrile illness or hospitalizations. Denies CP/palp/SOB/HA/congestion/fevers/GI or GU c/o. Taking meds as prescribed  Past Medical History:  Diagnosis Date   Acute bronchitis 11/06/2015   Arthritis    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   Grief reaction 06/04/2014   Hyperlipidemia    Hypertension    Medicare annual wellness visit, subsequent 06/20/2014   Medicare annual wellness visit, subsequent 05/06/2015   Osteopenia    Preventative health care 06/20/2014   Medication: Review, verify sig & reconcile(including outside meds): Duplicates discarded: DM supply source:  Preferred Pharmacy and which med where:[facility] ([facility] [place], [place] 90 day supply/mail order: 90 Local pharmacy: [facility], [place]  Allergies verified:UTD  Immunization Status: Prompt   Rosacea 11/06/2016   Rotator cuff tear, right 11/26/2012    Past Surgical History:  Procedure Laterality Date   CATARACT EXTRACTION Right 12/2016   EYE SURGERY Left 07/08/13   OOPHORECTOMY Right    SHOULDER ARTHROSCOPY      Family History  Problem Relation Age of Onset   Cancer Father 60       colon, preCA?   Arthritis Father        rheumatoid arthritis   Heart disease Father        stents   Stroke Maternal Grandmother    Hypertension Maternal Grandmother    Heart disease Paternal Grandfather    COPD Mother        smoker   Hypertension Mother    Arthritis Sister    Urolithiasis Sister    Heart disease Son     Social History   Socioeconomic History   Marital status: Married    Spouse name: Not on file   Number of children: Not on file   Years of education: Not on file   Highest education level: Not on file  Occupational History   Not on file  Social Needs   Financial resource strain: Not on file   Food insecurity:    Worry: Not on file    Inability: Not on file   Transportation needs:    Medical: Not on file    Non-medical: Not on file  Tobacco Use   Smoking status: Former  Smoker    Packs/day: 0.50    Years: 29.00    Pack years: 14.50    Types: Cigarettes    Start date: 01/03/1983   Smokeless tobacco: Never Used   Tobacco comment: 06-02-13  still smoking  Substance and Sexual Activity   Alcohol use: Yes    Alcohol/week: 21.0 standard drinks    Types: 21 Glasses of wine per week   Drug use: No   Sexual activity: Never    Comment: no dietary restrictions, smokes, lives with husband   Lifestyle   Physical activity:    Days per week: Not on file    Minutes per session: Not on file   Stress: Not on file  Relationships   Social connections:    Talks on phone: Not on file    Gets together: Not on file    Attends religious  service: Not on file    Active member of club or organization: Not on file    Attends meetings of clubs or organizations: Not on file    Relationship status: Not on file   Intimate partner violence:    Fear of current or ex partner: Not on file    Emotionally abused: Not on file    Physically abused: Not on file    Forced sexual activity: Not on file  Other Topics Concern   Not on file  Social History Narrative   Not on file    Outpatient Medications Prior to Visit  Medication Sig Dispense Refill   ALPRAZolam (XANAX) 0.25 MG tablet Take 0.5-1 tablets (0.125-0.25 mg total) by mouth 2 (two) times daily as needed for anxiety. 30 tablet 0   calcium-vitamin D (OSCAL WITH D) 500-200 MG-UNIT per tablet Take 1 tablet by mouth.     fish oil-omega-3 fatty acids 1000 MG capsule Take 1,200 mg by mouth daily.      losartan (COZAAR) 50 MG tablet Take 1 tablet (50 mg total) by mouth daily. 90 tablet 1   Multiple Vitamins-Minerals (MULTIVITAMIN WITH MINERALS) tablet Take 1 tablet by mouth daily.     rosuvastatin (CRESTOR) 20 MG tablet Take 1 tablet (20 mg total) by mouth daily. 90 tablet 2   No facility-administered medications prior to visit.     Allergies  Allergen Reactions   Pravastatin Anaphylaxis    myalgias   Codeine Other (See Comments)    Review of Systems  Constitutional: Negative for fever and malaise/fatigue.  HENT: Negative for congestion.   Eyes: Negative for blurred vision.  Respiratory: Negative for shortness of breath.   Cardiovascular: Negative for chest pain, palpitations and leg swelling.  Gastrointestinal: Negative for abdominal pain, blood in stool and nausea.  Genitourinary: Negative for dysuria and frequency.  Musculoskeletal: Negative for falls.  Skin: Negative for rash.  Neurological: Negative for dizziness, loss of consciousness and headaches.  Endo/Heme/Allergies: Negative for environmental allergies.  Psychiatric/Behavioral: Negative for  depression. The patient is nervous/anxious.        Objective:    Physical Exam Constitutional:      Appearance: Normal appearance. She is not ill-appearing.  HENT:     Head: Normocephalic and atraumatic.  Eyes:     General:        Right eye: No discharge.        Left eye: No discharge.  Pulmonary:     Effort: Pulmonary effort is normal.  Neurological:     Mental Status: She is alert and oriented to person, place, and time.  Psychiatric:  Mood and Affect: Mood normal.        Behavior: Behavior normal.     BP (!) 157/89    Pulse 78    Ht 4\' 11"  (1.499 m)    BMI 26.05 kg/m  Wt Readings from Last 3 Encounters:  11/08/17 129 lb (58.5 kg)  06/06/17 127 lb 9.6 oz (57.9 kg)  05/07/17 129 lb 3.2 oz (58.6 kg)    Diabetic Foot Exam - Simple   No data filed     Lab Results  Component Value Date   WBC 10.9 (H) 11/21/2017   HGB 13.5 11/21/2017   HCT 40.4 11/21/2017   PLT 390.0 11/21/2017   GLUCOSE 92 11/21/2017   CHOL 167 11/08/2017   TRIG 146.0 11/08/2017   HDL 64.20 11/08/2017   LDLDIRECT 135.0 05/08/2016   LDLCALC 73 11/08/2017   ALT 17 11/21/2017   AST 16 11/21/2017   NA 140 11/21/2017   K 4.3 11/21/2017   CL 106 11/21/2017   CREATININE 0.77 11/21/2017   BUN 13 11/21/2017   CO2 28 11/21/2017   TSH 1.66 11/08/2017   HGBA1C 5.7 11/08/2017    Lab Results  Component Value Date   TSH 1.66 11/08/2017   Lab Results  Component Value Date   WBC 10.9 (H) 11/21/2017   HGB 13.5 11/21/2017   HCT 40.4 11/21/2017   MCV 95.7 11/21/2017   PLT 390.0 11/21/2017   Lab Results  Component Value Date   NA 140 11/21/2017   K 4.3 11/21/2017   CO2 28 11/21/2017   GLUCOSE 92 11/21/2017   BUN 13 11/21/2017   CREATININE 0.77 11/21/2017   BILITOT 0.4 11/21/2017   ALKPHOS 68 11/21/2017   AST 16 11/21/2017   ALT 17 11/21/2017   PROT 6.9 11/21/2017   ALBUMIN 4.5 11/21/2017   CALCIUM 10.0 11/21/2017   GFR 78.63 11/21/2017   Lab Results  Component Value Date    CHOL 167 11/08/2017   Lab Results  Component Value Date   HDL 64.20 11/08/2017   Lab Results  Component Value Date   LDLCALC 73 11/08/2017   Lab Results  Component Value Date   TRIG 146.0 11/08/2017   Lab Results  Component Value Date   CHOLHDL 3 11/08/2017   Lab Results  Component Value Date   HGBA1C 5.7 11/08/2017       Assessment & Plan:   Problem List Items Addressed This Visit    HTN (hypertension)    Mildly elevated today but not always no changes to meds. Encouraged heart healthy diet such as the DASH diet and exercise as tolerated. She is asked to stay active, minimize sodium and monitor blood pressure every few days. Will revisit in one month and sooner as needed. Will increase meds at that time and check labs as needed.      Hyperlipidemia    Encouraged heart healthy diet, increase exercise, avoid trans fats      Hyperglycemia    hgba1c acceptable, minimize simple carbs. Increase exercise as tolerated.       Anxiety    She is doing well and has not needed to take her Alprazolam to date. She reports being reassured by the fact that she has it as she manages this pandemic and her grief.          I am having Kimberlea Oloughlin maintain her fish oil-omega-3 fatty acids, multivitamin with minerals, calcium-vitamin D, losartan, rosuvastatin, and ALPRAZolam.  No orders of the defined types were placed in  this encounter.   I discussed the assessment and treatment plan with the patient. The patient was provided an opportunity to ask questions and all were answered. The patient agreed with the plan and demonstrated an understanding of the instructions.   The patient was advised to call back or seek an in-person evaluation if the symptoms worsen or if the condition fails to improve as anticipated.  I provided  25 minutes of non-face-to-face time during this encounter.   Penni Homans, MD

## 2018-06-17 NOTE — Assessment & Plan Note (Signed)
hgba1c acceptable, minimize simple carbs. Increase exercise as tolerated.  

## 2018-06-17 NOTE — Assessment & Plan Note (Signed)
Encouraged heart healthy diet, increase exercise, avoid trans fats 

## 2018-06-17 NOTE — Assessment & Plan Note (Addendum)
Mildly elevated today but not always no changes to meds. Encouraged heart healthy diet such as the DASH diet and exercise as tolerated. She is asked to stay active, minimize sodium and monitor blood pressure every few days. Will revisit in one month and sooner as needed. Will increase meds at that time and check labs as needed.

## 2018-06-17 NOTE — Assessment & Plan Note (Signed)
She is doing well and has not needed to take her Alprazolam to date. She reports being reassured by the fact that she has it as she manages this pandemic and her grief.

## 2018-07-02 ENCOUNTER — Telehealth: Payer: Self-pay | Admitting: Family Medicine

## 2018-07-02 NOTE — Telephone Encounter (Signed)
Patient received a bill of over $200 for a virtual visit. Wanting to discuss with supervisor about virtual visit billing.

## 2018-07-18 ENCOUNTER — Ambulatory Visit: Payer: Medicare Other | Admitting: Family Medicine

## 2018-07-25 ENCOUNTER — Ambulatory Visit: Payer: Medicare Other | Admitting: Family Medicine

## 2018-08-21 NOTE — Telephone Encounter (Signed)
I have reviewed and there is no outstanding balance. It looks like her insurance paid on all the visits. She did have balances that she was responsible for due to yearly deductible.

## 2018-08-22 NOTE — Telephone Encounter (Signed)
SWP regarding concern over bill for virtual visit.  I explained to the pt the charge for VOV is no different than a regular OV.  The pt had also realized she had not met her deductible. I explained to pt all insurances are different with the amt of deductibles and how they are set.  Pt thought the visit was expensive given she did not have any labs ordered.  I explained to the pt Dr. Charlett Blake is keeping her pts out of the office as much as possible d/t the risk of COVID and is only having pts do labs if they are absolutely needed right now.

## 2018-09-09 ENCOUNTER — Ambulatory Visit (INDEPENDENT_AMBULATORY_CARE_PROVIDER_SITE_OTHER): Payer: Medicare Other | Admitting: Family Medicine

## 2018-09-09 ENCOUNTER — Other Ambulatory Visit: Payer: Self-pay

## 2018-09-09 DIAGNOSIS — F4321 Adjustment disorder with depressed mood: Secondary | ICD-10-CM | POA: Diagnosis not present

## 2018-09-09 DIAGNOSIS — I10 Essential (primary) hypertension: Secondary | ICD-10-CM

## 2018-09-09 DIAGNOSIS — R739 Hyperglycemia, unspecified: Secondary | ICD-10-CM

## 2018-09-09 DIAGNOSIS — E785 Hyperlipidemia, unspecified: Secondary | ICD-10-CM

## 2018-09-09 DIAGNOSIS — G2581 Restless legs syndrome: Secondary | ICD-10-CM

## 2018-09-09 NOTE — Assessment & Plan Note (Addendum)
No well controlled she is noting systolic numbers in the 179G and 160s since dropping her dose of Losartan down from 50 to 25 mg. She was hoping this would help her muscle cramps but it has not helped much. She will increase back up to 50 mg daily while we await her lab work, continue to hydrate well.  Encouraged heart healthy diet such as the DASH diet and exercise as tolerated.

## 2018-09-09 NOTE — Progress Notes (Signed)
Virtual Visit via Video Note  I connected with Robin [name] on 09/09/18 at  1:20 PM EDT by a video enabled telemedicine application and verified that I am speaking with the correct person using two identifiers.  Location: Patient: home Provider: office   I discussed the limitations of evaluation and management by telemedicine and the availability of in person appointments. The patient expressed understanding and agreed to proceed. Magdalene Molly, CMA was able to get patient set up on video visit   Subjective:    Patient ID: Robin [name], female    DOB: 1945/10/07, 73 y.o.   MRN: [id number]  No chief complaint on file.   HPI Patient is in today for evaluation of worsening restless leg symptoms. Very uncomfortable in evening and unable to settle down and get comfortable. Al    so notes some discomfort in her knees and some cramping in both legs that is getting more severe and debilitating. She denies recent febrile illness or hospitalizations. No change in hydration status or diet. Denies CP/palp/SOB/HA/congestion/fevers/GI or GU c/o. Taking meds as prescribed  Past Medical History:  Diagnosis Date  . Acute bronchitis 11/06/2015  . Arthritis   . 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 Grief reaction 06/04/2014  . Hyperlipidemia   . Hypertension   . Medicare annual wellness visit, subsequent 06/20/2014  . Medicare annual wellness visit, subsequent 05/06/2015  . Osteopenia   . Preventative health care 06/20/2014   Medication: Review, verify sig & reconcile(including outside meds): Duplicates discarded: DM supply source:  Preferred Pharmacy and which med where:[facility] (MAIL ORDER) [place], [place] 90 day supply/mail order: 90 Local pharmacy: [place], [place]  Allergies verified:UTD  Immunization Status: Prompt  . Rosacea 11/06/2016  . Rotator cuff tear, right 11/26/2012    Past Surgical History:  Procedure  Laterality Date  . CATARACT EXTRACTION Right 12/2016  . EYE SURGERY Left 07/08/13  . OOPHORECTOMY Right   . SHOULDER ARTHROSCOPY      Family History  Problem Relation Age of Onset  . Cancer Father 36       colon, preCA?  Marland Kitchen Arthritis Father        rheumatoid arthritis  . Heart disease Father        stents  . Stroke Maternal Grandmother   . Hypertension Maternal Grandmother   . Heart disease Paternal Grandfather   . COPD Mother        smoker  . Hypertension Mother   . Arthritis Sister   . Urolithiasis Sister   . Heart disease Son     Social History   Socioeconomic History  . Marital status: Married    Spouse name: Not on file  . Number of children: Not on file  . Years of education: Not on file  . Highest education level: Not on file  Occupational History  . Not on file  Social Needs  . Financial resource strain: Not on file  . Food insecurity    Worry: Not on file    Inability: Not on file  . Transportation needs    Medical: Not on file    Non-medical: Not on file  Tobacco Use  . Smoking status: Former Smoker    Packs/day: 0.50    Years: 29.00    Pack years: 14.50    Types: Cigarettes    Start date: 01/03/1983  . Smokeless tobacco: Never Used  . Tobacco comment: 06-02-13  still smoking  Substance and Sexual Activity  . Alcohol use: Yes    Alcohol/week: 21.0 standard drinks    Types: 21 Glasses of wine per week  . Drug use: No  . Sexual activity: Never    Comment: no dietary restrictions, smokes, lives with husband   Lifestyle  . Physical activity    Days per week: Not on file    Minutes per session: Not on file  . Stress: Not on file  Relationships  . Social Herbalist on phone: Not on file    Gets together: Not on file    Attends religious service: Not on file    Active member of club or organization: Not on file    Attends meetings of clubs or organizations: Not on file    Relationship status: Not on file  . Intimate partner violence     Fear of current or ex partner: Not on file    Emotionally abused: Not on file    Physically abused: Not on file    Forced sexual activity: Not on file  Other Topics Concern  . Not on file  Social History Narrative  . Not on file    Outpatient Medications Prior to Visit  Medication Sig Dispense Refill  . ALPRAZolam (XANAX) 0.25 MG tablet Take 0.5-1 tablets (0.125-0.25 mg total) by mouth 2 (two) times daily as needed for anxiety. 30 tablet 0  . calcium-vitamin D (OSCAL WITH D) 500-200 MG-UNIT per tablet Take 1 tablet by mouth.    . fish oil-omega-3 fatty acids 1000 MG capsule Take 1,200 mg by mouth daily.     Marland Kitchen losartan (COZAAR) 50 MG tablet Take 1 tablet (50 mg total) by mouth daily. 90 tablet 1  . Multiple Vitamins-Minerals (MULTIVITAMIN WITH MINERALS) tablet Take 1 tablet by mouth daily.    . rosuvastatin (CRESTOR) 20 MG tablet Take 1 tablet (20 mg total) by mouth daily. 90 tablet 2   No facility-administered medications prior to visit.     Allergies  Allergen Reactions  . Pravastatin Anaphylaxis    myalgias  . Codeine Other (See Comments)    Review of Systems  Constitutional: Negative for fever and malaise/fatigue.  HENT: Negative for congestion.   Eyes: Negative for blurred vision.  Respiratory: Negative for shortness of breath.   Cardiovascular: Negative for chest pain, palpitations and leg swelling.  Gastrointestinal: Negative for abdominal pain, blood in stool and nausea.  Genitourinary: Negative for dysuria and frequency.  Musculoskeletal: Positive for myalgias. Negative for falls.  Skin: Negative for rash.  Neurological: Negative for dizziness, loss of consciousness and headaches.  Endo/Heme/Allergies: Negative for environmental allergies.  Psychiatric/Behavioral: Negative for depression. The patient is not nervous/anxious.        Objective:    Physical Exam Constitutional:      Appearance: Normal appearance. She is not ill-appearing.  HENT:     Head:  Normocephalic and atraumatic.     Right Ear: Tympanic membrane normal.     Left Ear: Tympanic membrane normal.     Nose: Nose normal.  Eyes:     General:        Right eye: No discharge.        Left eye: No discharge.     Extraocular Movements: Extraocular movements intact.     Pupils: Pupils are equal, round, and reactive to light.  Pulmonary:     Effort: Pulmonary effort is normal.  Neurological:     General: No focal deficit present.  Mental Status: She is alert and oriented to person, place, and time.  Psychiatric:        Mood and Affect: Mood normal.        Behavior: Behavior normal.     There were no vitals taken for this visit. Wt Readings from Last 3 Encounters:  11/08/17 129 lb (58.5 kg)  06/06/17 127 lb 9.6 oz (57.9 kg)  05/07/17 129 lb 3.2 oz (58.6 kg)    Diabetic Foot Exam - Simple   No data filed     Lab Results  Component Value Date   WBC 10.9 (H) 11/21/2017   HGB 13.5 11/21/2017   HCT 40.4 11/21/2017   PLT 390.0 11/21/2017   GLUCOSE 92 11/21/2017   CHOL 167 11/08/2017   TRIG 146.0 11/08/2017   HDL 64.20 11/08/2017   LDLDIRECT 135.0 05/08/2016   LDLCALC 73 11/08/2017   ALT 17 11/21/2017   AST 16 11/21/2017   NA 140 11/21/2017   K 4.3 11/21/2017   CL 106 11/21/2017   CREATININE 0.77 11/21/2017   BUN 13 11/21/2017   CO2 28 11/21/2017   TSH 1.66 11/08/2017   HGBA1C 5.7 11/08/2017    Lab Results  Component Value Date   TSH 1.66 11/08/2017   Lab Results  Component Value Date   WBC 10.9 (H) 11/21/2017   HGB 13.5 11/21/2017   HCT 40.4 11/21/2017   MCV 95.7 11/21/2017   PLT 390.0 11/21/2017   Lab Results  Component Value Date   NA 140 11/21/2017   K 4.3 11/21/2017   CO2 28 11/21/2017   GLUCOSE 92 11/21/2017   BUN 13 11/21/2017   CREATININE 0.77 11/21/2017   BILITOT 0.4 11/21/2017   ALKPHOS 68 11/21/2017   AST 16 11/21/2017   ALT 17 11/21/2017   PROT 6.9 11/21/2017   ALBUMIN 4.5 11/21/2017   CALCIUM 10.0 11/21/2017   GFR 78.63  11/21/2017   Lab Results  Component Value Date   CHOL 167 11/08/2017   Lab Results  Component Value Date   HDL 64.20 11/08/2017   Lab Results  Component Value Date   LDLCALC 73 11/08/2017   Lab Results  Component Value Date   TRIG 146.0 11/08/2017   Lab Results  Component Value Date   CHOLHDL 3 11/08/2017   Lab Results  Component Value Date   HGBA1C 5.7 11/08/2017       Assessment & Plan:   Problem List Items Addressed This Visit    HTN (hypertension)    No well controlled she is noting systolic numbers in the 163W and 160s since dropping her dose of Losartan down from 50 to 25 mg. She was hoping this would help her muscle cramps but it has not helped much. She will increase back up to 50 mg daily while we await her lab work, continue to hydrate well.  Encouraged heart healthy diet such as the DASH diet and exercise as tolerated.       Hyperlipidemia    Has not been taking her Rosuvastatin for months. No concerning side effects noted when she was taking it. Will proceed with lipid panel and reassess      Grief reaction    She is managing since her husband died in 19-Mar-2022 by keeping her herself busy and exercising regularly      Hyperglycemia    hgba1c acceptable, minimize simple carbs. Increase exercise as tolerated.       RLS (restless legs syndrome)    Cannot get comfortable at  night and is unable to sleep. Will proceed with labs and then consider a trial of Requip. Reminded to hydrate well         I am having Tonnia Walder maintain her fish oil-omega-3 fatty acids, multivitamin with minerals, calcium-vitamin D, losartan, rosuvastatin, and ALPRAZolam.  No orders of the defined types were placed in this encounter. I discussed the assessment and treatment plan with the patient. The patient was provided an opportunity to ask questions and all were answered. The patient agreed with the plan and demonstrated an understanding of the instructions.   The patient  was advised to call back or seek an in-person evaluation if the symptoms worsen or if the condition fails to improve as anticipated.  I provided 25 minutes of non-face-to-face time during this encounter.   Penni Homans, MD

## 2018-09-09 NOTE — Assessment & Plan Note (Signed)
Cannot get comfortable at night and is unable to sleep. Will proceed with labs and then consider a trial of Requip. Reminded to hydrate well

## 2018-09-09 NOTE — Assessment & Plan Note (Signed)
She is managing since her husband died in 03-25-22 by keeping her herself busy and exercising regularly

## 2018-09-09 NOTE — Assessment & Plan Note (Signed)
Has not been taking her Rosuvastatin for months. No concerning side effects noted when she was taking it. Will proceed with lipid panel and reassess

## 2018-09-09 NOTE — Assessment & Plan Note (Signed)
hgba1c acceptable, minimize simple carbs. Increase exercise as tolerated.  

## 2018-09-16 ENCOUNTER — Encounter: Payer: Self-pay | Admitting: Family Medicine

## 2018-09-16 DIAGNOSIS — R739 Hyperglycemia, unspecified: Secondary | ICD-10-CM

## 2018-09-16 DIAGNOSIS — I10 Essential (primary) hypertension: Secondary | ICD-10-CM

## 2018-09-16 DIAGNOSIS — E785 Hyperlipidemia, unspecified: Secondary | ICD-10-CM

## 2018-09-16 NOTE — Progress Notes (Signed)
There are no lab orders in?

## 2018-09-17 NOTE — Telephone Encounter (Signed)
Princess do you remember getting a message after her visit to schedule lab appt?

## 2018-09-19 ENCOUNTER — Telehealth: Payer: Self-pay | Admitting: *Deleted

## 2018-09-19 ENCOUNTER — Encounter: Payer: Self-pay | Admitting: Family Medicine

## 2018-09-19 NOTE — Telephone Encounter (Signed)
Patient sent mychart to schedule lab appointment.  Please call to schedule per Dr. Charlett Blake.  Orders in.

## 2018-09-19 NOTE — Telephone Encounter (Signed)
Message sent to scheduling for patient to call.   Copied from [place] [phone]. Topic: Appointment Scheduling - Scheduling Inquiry for Clinic >> Sep 17, 2018  1:38 PM Rayann Heman wrote: Reason for CRM: pt is waiting for a call to schedule labs. Please call cell >> Sep 17, 2018  2:03 PM Margot Ables wrote: There is a Pharmacist, community message in pts chart from her requesting labs as well. There are no orders in. Please advise.

## 2018-09-20 NOTE — Telephone Encounter (Signed)
LM on home and cell # for lab appt

## 2018-09-25 ENCOUNTER — Other Ambulatory Visit: Payer: Self-pay

## 2018-09-25 ENCOUNTER — Other Ambulatory Visit (INDEPENDENT_AMBULATORY_CARE_PROVIDER_SITE_OTHER): Payer: Medicare Other

## 2018-09-25 DIAGNOSIS — R739 Hyperglycemia, unspecified: Secondary | ICD-10-CM

## 2018-09-25 DIAGNOSIS — E785 Hyperlipidemia, unspecified: Secondary | ICD-10-CM | POA: Diagnosis not present

## 2018-09-25 DIAGNOSIS — I10 Essential (primary) hypertension: Secondary | ICD-10-CM

## 2018-09-25 LAB — CBC
HCT: 42.1 % (ref 36.0–46.0)
Hemoglobin: 14.1 g/dL (ref 12.0–15.0)
MCHC: 33.4 g/dL (ref 30.0–36.0)
MCV: 94.9 fl (ref 78.0–100.0)
Platelets: 410 10*3/uL — ABNORMAL HIGH (ref 150.0–400.0)
RBC: 4.44 Mil/uL (ref 3.87–5.11)
RDW: 13.9 % (ref 11.5–15.5)
WBC: 10.7 10*3/uL — ABNORMAL HIGH (ref 4.0–10.5)

## 2018-09-25 LAB — COMPREHENSIVE METABOLIC PANEL
ALT: 13 U/L (ref 0–35)
AST: 16 U/L (ref 0–37)
Albumin: 4.5 g/dL (ref 3.5–5.2)
Alkaline Phosphatase: 63 U/L (ref 39–117)
BUN: 15 mg/dL (ref 6–23)
CO2: 28 mEq/L (ref 19–32)
Calcium: 10.2 mg/dL (ref 8.4–10.5)
Chloride: 101 mEq/L (ref 96–112)
Creatinine, Ser: 0.82 mg/dL (ref 0.40–1.20)
GFR: 68.63 mL/min (ref >60.00)
Glucose, Bld: 85 mg/dL (ref 70–99)
Potassium: 5 mEq/L (ref 3.5–5.1)
Sodium: 136 mEq/L (ref 135–145)
Total Bilirubin: 0.5 mg/dL (ref 0.2–1.2)
Total Protein: 6.8 g/dL (ref 6.0–8.3)

## 2018-09-25 LAB — LIPID PANEL
Cholesterol: 201 mg/dL — ABNORMAL HIGH (ref 0–200)
HDL: 82.8 mg/dL (ref >39.00)
LDL Cholesterol: 90 mg/dL (ref 0–99)
NonHDL: 118
Total CHOL/HDL Ratio: 2
Triglycerides: 140 mg/dL (ref 0.0–149.0)
VLDL: 28 mg/dL (ref 0.0–40.0)

## 2018-09-25 LAB — TSH: TSH: 1.07 u[IU]/mL (ref 0.35–4.50)

## 2018-09-25 LAB — HEMOGLOBIN A1C: Hgb A1c MFr Bld: 5.7 % (ref 4.6–6.5)

## 2018-09-26 ENCOUNTER — Ambulatory Visit (INDEPENDENT_AMBULATORY_CARE_PROVIDER_SITE_OTHER): Payer: Medicare Other

## 2018-09-26 ENCOUNTER — Ambulatory Visit (HOSPITAL_BASED_OUTPATIENT_CLINIC_OR_DEPARTMENT_OTHER)
Admission: RE | Admit: 2018-09-26 | Discharge: 2018-09-26 | Disposition: A | Discharge status: Home / Self Care | Payer: Medicare Other | Source: Ambulatory Visit | Attending: Family Medicine | Admitting: Family Medicine

## 2018-09-26 DIAGNOSIS — Z23 Encounter for immunization: Secondary | ICD-10-CM

## 2018-09-26 DIAGNOSIS — Z1231 Encounter for screening mammogram for malignant neoplasm of breast: Secondary | ICD-10-CM | POA: Diagnosis not present

## 2018-09-26 DIAGNOSIS — Z1239 Encounter for other screening for malignant neoplasm of breast: Secondary | ICD-10-CM

## 2018-09-27 NOTE — Addendum Note (Signed)
Addended by: Magdalene Molly A on: 09/27/2018 03:52 PM   Modules accepted: Orders

## 2018-10-23 ENCOUNTER — Telehealth: Payer: Self-pay | Admitting: *Deleted

## 2018-10-23 DIAGNOSIS — U071 COVID-19: Secondary | ICD-10-CM

## 2018-10-23 NOTE — Telephone Encounter (Signed)
Please order the COVID antibody test for 9/28

## 2018-10-23 NOTE — Telephone Encounter (Signed)
Robin [name], I do not see a message  Please advise

## 2018-10-23 NOTE — Telephone Encounter (Signed)
I'm sorry! The message didn't carry over from the [place]. I have copied it below.  [name], Robin (Patient) [name], Robin (Patient) General - Inquiry  Summary: Antibody Orders  Reason for CRM: patient would like antibody test done 10/28/2018 when she comes in for labs. Patient states her son is convenience she had a COVID in Feb

## 2018-10-25 NOTE — Telephone Encounter (Signed)
Orders lab works for [facility]

## 2018-10-28 ENCOUNTER — Other Ambulatory Visit: Payer: Self-pay

## 2018-10-28 ENCOUNTER — Other Ambulatory Visit (INDEPENDENT_AMBULATORY_CARE_PROVIDER_SITE_OTHER): Payer: Medicare Other

## 2018-10-28 DIAGNOSIS — U071 COVID-19: Secondary | ICD-10-CM | POA: Diagnosis not present

## 2018-10-28 DIAGNOSIS — I10 Essential (primary) hypertension: Secondary | ICD-10-CM

## 2018-10-28 DIAGNOSIS — J988 Other specified respiratory disorders: Secondary | ICD-10-CM

## 2018-10-28 LAB — CBC
HCT: 42.9 % (ref 36.0–46.0)
Hemoglobin: 14.1 g/dL (ref 12.0–15.0)
MCHC: 32.9 g/dL (ref 30.0–36.0)
MCV: 94.9 fl (ref 78.0–100.0)
Platelets: 385 10*3/uL (ref 150.0–400.0)
RBC: 4.52 Mil/uL (ref 3.87–5.11)
RDW: 13.4 % (ref 11.5–15.5)
WBC: 9.8 10*3/uL (ref 4.0–10.5)

## 2018-10-29 LAB — SAR COV2 SEROLOGY (COVID19)AB(IGG),IA: SARS CoV2 AB IGG: NEGATIVE

## 2018-12-25 ENCOUNTER — Other Ambulatory Visit: Payer: Self-pay

## 2019-02-14 ENCOUNTER — Encounter: Payer: Self-pay | Admitting: Family Medicine

## 2019-02-18 ENCOUNTER — Telehealth: Payer: Self-pay | Admitting: Family Medicine

## 2019-02-18 NOTE — Telephone Encounter (Signed)
Spoke with Mrs. Robin [name] regarding AWV. Patient declined at this time, but wanted to know about the cologuard test. Patient stated she sent message via [place]. Informed patient that message was received and that she will receive a call back. SF

## 2019-02-18 NOTE — Telephone Encounter (Signed)
Robin [name], Can you order this online  Please advise

## 2019-02-19 ENCOUNTER — Telehealth: Payer: Self-pay

## 2019-02-19 NOTE — Telephone Encounter (Signed)
Cologuard ordered through online portal.  

## 2019-02-28 ENCOUNTER — Encounter: Payer: Self-pay | Admitting: Family Medicine

## 2019-03-03 DIAGNOSIS — Z1211 Encounter for screening for malignant neoplasm of colon: Secondary | ICD-10-CM | POA: Diagnosis not present

## 2019-03-06 ENCOUNTER — Encounter: Payer: Self-pay | Admitting: Family Medicine

## 2019-03-06 LAB — COLOGUARD: Cologuard: NEGATIVE

## 2019-03-07 ENCOUNTER — Other Ambulatory Visit: Payer: Self-pay | Admitting: Family Medicine

## 2019-03-07 DIAGNOSIS — M79673 Pain in unspecified foot: Secondary | ICD-10-CM

## 2019-03-07 NOTE — Telephone Encounter (Signed)
Please advise 

## 2019-03-09 ENCOUNTER — Other Ambulatory Visit: Payer: Self-pay | Admitting: Family Medicine

## 2019-03-09 DIAGNOSIS — M79671 Pain in right foot: Secondary | ICD-10-CM

## 2019-03-10 ENCOUNTER — Ambulatory Visit: Payer: Self-pay

## 2019-03-10 ENCOUNTER — Ambulatory Visit (INDEPENDENT_AMBULATORY_CARE_PROVIDER_SITE_OTHER): Payer: Medicare Other | Admitting: Family Medicine

## 2019-03-10 ENCOUNTER — Encounter: Payer: Self-pay | Admitting: Neurology

## 2019-03-10 ENCOUNTER — Ambulatory Visit (HOSPITAL_BASED_OUTPATIENT_CLINIC_OR_DEPARTMENT_OTHER)
Admission: RE | Admit: 2019-03-10 | Discharge: 2019-03-10 | Disposition: A | Discharge status: Home / Self Care | Payer: Medicare Other | Source: Ambulatory Visit | Attending: Family Medicine | Admitting: Family Medicine

## 2019-03-10 ENCOUNTER — Other Ambulatory Visit: Payer: Self-pay

## 2019-03-10 ENCOUNTER — Telehealth: Payer: Self-pay | Admitting: Family Medicine

## 2019-03-10 ENCOUNTER — Ambulatory Visit (INDEPENDENT_AMBULATORY_CARE_PROVIDER_SITE_OTHER): Payer: Medicare Other

## 2019-03-10 ENCOUNTER — Encounter: Payer: Self-pay | Admitting: Family Medicine

## 2019-03-10 VITALS — BP 150/78 | HR 83 | Ht 59.0 in | Wt 114.2 lb

## 2019-03-10 DIAGNOSIS — M21371 Foot drop, right foot: Secondary | ICD-10-CM

## 2019-03-10 DIAGNOSIS — M25571 Pain in right ankle and joints of right foot: Secondary | ICD-10-CM

## 2019-03-10 DIAGNOSIS — M79671 Pain in right foot: Secondary | ICD-10-CM | POA: Diagnosis not present

## 2019-03-10 DIAGNOSIS — S8991XA Unspecified injury of right lower leg, initial encounter: Secondary | ICD-10-CM | POA: Diagnosis not present

## 2019-03-10 DIAGNOSIS — S99921A Unspecified injury of right foot, initial encounter: Secondary | ICD-10-CM | POA: Diagnosis not present

## 2019-03-10 MED ORDER — AMBULATORY NON FORMULARY MEDICATION: 0 refills | Status: DC

## 2019-03-10 NOTE — Telephone Encounter (Signed)
Please inform patient that I sent the prescription for the ankle-foot orthosis device to [facility].  She should be contacted soon by them however she could certainly call them herself to schedule the appointment for the prosthesis fitting.  [facility] [place] [address], [place] [address] Phone: [phone] Fax: [phone]

## 2019-03-10 NOTE — Telephone Encounter (Signed)
Contacted pt and she had already been contacted by [facility] regarding her AFO.

## 2019-03-10 NOTE — Progress Notes (Signed)
Subjective:    CC: R foot pain  I, Molly Weber, LAT, ATC, am serving as scribe for Dr. Lynne Leader.  HPI: Pt is a 74 y/o female presenting w/ c/o R foot numbness/tingling along the plantar surface of her foot since November 2020 when she rolled her foot and ankle into inversion.  She does have some intermittent pain along her R dorsal foot along MT 2-3.  She rates her pain at a /10 and describes her pain as sharp.  She notes that shortly after she injured her ankle she had twitching into her foot resulting in foot weakness.  She notes that she commonly bumps her foot into the objects at home when she is walking.  When she goes out for a walk with her friend she notes that after a few minutes she starts slapping her foot on the ground and having to pick her foot up high since she does not trip or catch her foot.  Radiating pain: Yes into her R calf Numbness/tingling: Yes along her R plantar foot Weakness: Yes in ankle dorsiflexors and toe extensors Aggravating factors: Walking; crossing R leg over L; Crossing R foot over L foot Treatments tried: Ankle and toe ROM; heat  Diagnostic testing: R foot XR today  Pertinent review of Systems: No fevers or chills.  No other weakness or numbness.  Relevant historical information: History of hypertension, restless leg syndrome, osteopenia   Objective:    Vitals:   03/10/19 1246  BP: (!) 150/78  Pulse: 83  SpO2: 95%   General: Well Developed, well nourished, and in no acute distress.   MSK:  L-spine: Nontender normal lumbar motion.  Lower extremity strength intact except for noted below. Mildly positive slump test right.  Right leg: Normal-appearing Right hip normal motion. Right knee: Normal-appearing normal motion.  Nontender. Direct palpation in and around fibular head results in worsening paresthesias into the foot. Normal knee strength and stability to ligamentous testing.  Right lower leg: Normal-appearing  nontender.  Right foot and ankle: Normal-appearing no deformity. Nontender. Normal motion. Strength: Dorsiflexion 3/5. Plantarflexion 5/5. Inversion 5/5. Eversion 4+/5. Great toe dorsiflexion 3/5. Great toe plantar flexion 5/5. Sensation intact to light touch throughout. Pulses and cap refill are intact.  Contralateral left foot and ankle normal-appearing nontender normal motion normal strength.  Patient walks with a subtle drop foot gait  Lab and Radiology Results  X-ray images obtained today personally and independently reviewed.  Right foot: No acute fractures or severe degenerative changes.  No lytic lesions.  Right tib-fib: Mild degenerative changes at knee.  No significant abnormalities no fractures lytic lesions.  Await formal radiology review  Limited musculoskeletal ultrasound right leg Normal-appearing soft tissue structures medial and lateral foot and ankle. Ultrasound evaluation and examination that a lateral view reveals common fibular nerve slightly enlarged.  Nerve able to sublux over the proximal fibula.  Pressure of this area does reproduce her paresthesia symptoms into her foot. Impression: Common fibular nerve compression/injury   Impression and Recommendations:    Assessment and Plan: 74 y.o. female with  Right foot drop and right foot paresthesia occurring following an ankle inversion injury in November. Etiology at this time is somewhat unclear however the most likely explanation is injury to the common fibular nerve at the lateral knee.  Differential does include L5 radiculopathy or other nerve injury.  Plan for nerve conduction study to further characterize nerve injury.  X-rays as above.  Additionally will treat with ankle-foot orthosis to improve  gait and reduce tripping.  Check back in about a month.   Sent Rx for AFO to  [facility] [place], [place] [address] Phone: [phone] Fax:  [phone]  PDMP not reviewed this encounter. Orders Placed This Encounter  Procedures  . [place] LIMITED JOINT SPACE STRUCTURES LOW RIGHT(NO LINKED CHARGES)    Order Specific Question:   Reason for Exam (SYMPTOM  OR DIAGNOSIS REQUIRED)    Answer:   foot pain    Order Specific Question:   Preferred imaging location?    Answer:   [facility]  . DG Tibia/Fibula Right    Standing Status:   Future    Number of Occurrences:   1    Standing Expiration Date:   05/07/2020    Order Specific Question:   Reason for Exam (SYMPTOM  OR DIAGNOSIS REQUIRED)    Answer:   Ankle inversion in November. Now with foot drop    Order Specific Question:   Preferred imaging location?    Answer:   [facility]    Order Specific Question:   Radiology Contrast Protocol - do NOT remove file path    Answer:   [email or web address]  . Ambulatory referral to Neurology    Referral Priority:   Routine    Referral Type:   Consultation    Referral Reason:   Specialty Services Required    Requested Specialty:   Neurology    Number of Visits Requested:   1  . NCV with EMG(electromyography)    Standing Status:   Future    Standing Expiration Date:   03/09/2020    Order Specific Question:   Where should this test be performed?    Answer:   [facility]   Meds ordered this encounter  Medications  . AMBULATORY NON FORMULARY MEDICATION    Sig: Ankle Foot Orthosis Disp 1 Foot Drop M21.371 Fax to [facility] Fax [phone]    Dispense:  1 each    Refill:  0    Discussed warning signs or symptoms. Please see discharge instructions. Patient expresses understanding.   The above documentation has been reviewed and is accurate and complete Lynne Leader

## 2019-03-10 NOTE — Patient Instructions (Addendum)
Thank you for coming in today. I think you have a foot drop due to the nerve in your leg.  You should hear from [facility] about nerve study.  You should also hear about the Ankle Foot Orthosis (AFO) for foot drop.  Recheck with me in about 1 month or about 1 week after the nerve test.  I will also get an xray of your lower leg today.    Common Peroneal Nerve Entrapment  Common peroneal nerve entrapment is a condition that can make it hard to lift a foot. The condition results from pressure on a nerve in the lower leg called the common peroneal nerve. Your common peroneal nerve provides feeling to your outer lower leg and foot. It also supplies the muscles that move your foot and toes upward and outward. What are the causes? This condition may be caused by:  Sitting cross-legged, squatting, or kneeling for long periods of time.  A hard, direct hit to the side of the lower leg.  Swelling from a knee injury.  A break (fracture) in one of the lower leg bones.  Wearing a boot or cast that ends just below the knee.  A growth or cyst near the nerve. What increases the risk? This condition is more likely to develop in people who play:  Contact sports, such as football or hockey.  Sports where you wear high and stiff boots, such as skiing. What are the signs or symptoms? Symptoms of this condition include:  Trouble lifting your foot up (foot drop).  Tripping often.  Your foot hitting the ground harder than normal as you walk.  Numbness, tingling, or pain in the outside of the knee, outside of the lower leg, and top of the foot.  Sensitivity to pressure on the front or side of the leg. How is this diagnosed? This condition may be diagnosed based on:  Your symptoms.  Your medical history.  A physical exam.  Tests, such as: ? An X-ray to check the bones of your knee and leg. ? MRI to check tendons that attach to the side of your knee. ? An ultrasound to check for a  growth or cyst. ? An electromyogram (EMG) to check your nerves. During your physical exam, your health care provider will check for numbness in your leg and test the strength of your lower leg muscles. He or she may tap the side of your lower leg to see if that causes tingling. How is this treated? Treatment for this condition may include:  Avoiding activities that make symptoms worse.  Using a brace to hold up your foot and toes.  Taking anti-inflammatory pain medicines to relieve swelling and lessen pain.  Having medicines injected into your ankle joint to lessen pain and swelling.  Doing exercises to help you regain or maintain movement (physical therapy).  Surgery to take pressure off the nerve. This may be needed if there is no improvement after 2-3 months or if there is a growth pushing on the nerve.  Returning gradually to full activity. Follow these instructions at home: If you have a brace:  Wear it as told by your health care provider. Remove it only as told by your health care provider.  Loosen the brace if your toes tingle, become numb, or turn cold and blue.  Keep the brace clean.  If the brace is not waterproof: ? Do not let it get wet. ? Cover it with a watertight covering when you take a bath or  a shower.  Ask your health care provider when it is safe to drive with a brace on your foot. Activity  Return to your normal activities as told by your health care provider. Ask your health care provider what activities are safe for you.  Do not do any activities that make pain or swelling worse.  Do exercises as told by your health care provider. General instructions  Take over-the-counter and prescription medicines only as told by your health care provider.  Do not put your full weight on your knee until your health care provider says you can. Use crutches as directed by your health care provider.  Keep all follow-up visits as told by your health care provider.  This is important. How is this prevented?  Wear supportive footwear that is appropriate for your athletic activity.  Avoid athletic activities that cause ankle pain or swelling.  Wear protective padding over your lower legs when playing contact sports.  Make sure your boots do not put extra pressure on the area just below your knees.  Do not sit cross-legged for long periods of time. Contact a health care provider if:  Your symptoms do not get better in 2-3 months.  The weakness or numbness in your leg or foot gets worse. Summary  Common peroneal nerve entrapment is a condition that results from pressure on a nerve in the lower leg called the common peroneal nerve.  This condition may be caused by a hard hit, swelling, a fracture, or a cyst in the lower leg.  Treatment may include rest, a brace, medicines, and physical therapy. Sometimes surgery is needed.  Do not do any activities that make pain or swelling worse. This information is not intended to replace advice given to you by your health care provider. Make sure you discuss any questions you have with your health care provider. Document Revised: 11/26/2017 Document Reviewed: 11/26/2017 Elsevier Patient Education  2020 [employer].

## 2019-03-11 NOTE — Progress Notes (Signed)
Xray Lower leg is normal.  Xray of foot is also normal

## 2019-03-26 ENCOUNTER — Ambulatory Visit: Payer: Medicare Other | Admitting: Neurology

## 2019-03-26 ENCOUNTER — Other Ambulatory Visit: Payer: Self-pay

## 2019-03-26 DIAGNOSIS — M25571 Pain in right ankle and joints of right foot: Secondary | ICD-10-CM | POA: Diagnosis not present

## 2019-03-26 DIAGNOSIS — G5731 Lesion of lateral popliteal nerve, right lower limb: Secondary | ICD-10-CM

## 2019-03-26 NOTE — Procedures (Signed)
[facility]  [address], [address]  [place], [place] [address] Tel: [phone] Fax:  [phone] Test Date:  03/26/2019  Patient: Robin [name] DOB: March 20, 1945 Physician: Narda Amber, DO  Sex: Female Height: 4\' 11"  Ref Phys: Lynne Leader, MD  ID#: [id number] Temp: 34.0C Technician:    Patient Complaints: This is a 74 year old female referred for evaluation of right foot drop.  NCV & EMG Findings: Electrodiagnostic testing was limited to nerve conduction study portion only, as patient was unable to tolerate needle EMG.  Findings are as follows:  1. Right sural and bilateral superficial peroneal sensory responses are symmetric and within normal limits. 2. Right peroneal motor response shows conduction block and slowed conduction velocity across the fibula and, with normal latency and amplitude.  Right tibial and left peroneal motor responses are within normal limits. 3. Right tibial H reflex study is within normal limits. 4. Needle electrode examination showed fibrillation potentials in the tibialis anterior.  Motor unit assessment was not completed as patient was unable to tolerate further testing.  Impression: This is a limited study of the right lower extremity.  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  Further characterization of the lesion cannot be performed as needle electrode examination was not completed at patient's request.   ___________________________ Narda Amber, DO    Nerve Conduction Studies Anti Sensory Summary Table   Site NR Peak (ms) Norm Peak (ms) P-T Amp (V) Norm P-T Amp  Left Sup Peroneal Anti Sensory (Ant Lat Mall)  34C  12 cm    2.6 <4.6 15.3 >3  Right Sup Peroneal Anti Sensory (Ant Lat Mall)  34C  12 cm    2.2 <4.6 18.0 >3  Right Sural Anti Sensory (Lat Mall)  34C  Calf    2.6 <4.6 34.1 >3   Motor Summary Table   Site NR Onset (ms) Norm Onset (ms)  O-P Amp (mV) Norm O-P Amp Site1 Site2 Delta-0 (ms) Dist (cm) Vel (m/s) Norm Vel (m/s)  Left Peroneal Motor (Ext Dig Brev)  34C  Ankle    4.1 <6.0 5.7 >2.5 B Fib Ankle 6.2 33.0 53 >40  B Fib    10.3  5.6  Poplt B Fib 1.3 8.0 62 >40  Poplt    11.6  5.6         Right Peroneal Motor (Ext Dig Brev)  34C  Ankle    3.4 <6.0 5.7 >2.5 B Fib Ankle 5.7 32.0 56 >40  B Fib    9.1  5.0  Poplt B Fib 2.4 8.0 33 >40  Poplt    11.5  2.8         Left Peroneal TA Motor (Tib Ant)  34C  Fib Head    2.6 <4.5 6.2 >3 Poplit Fib Head 1.2 8.0 67 >40  Poplit    3.8  6.1         Right Peroneal TA Motor (Tib Ant)  34C  Fib Head    0.9 <4.5 5.6 >3 Poplit Fib Head 4.3 8.0 19 >40  Poplit    5.2  1.5         Right Tibial Motor (Abd Hall Brev)  34C  Ankle    3.9 <6.0 20.6 >4 Knee Ankle 7.9 37.0 47 >40  Knee    11.8  13.4          H Reflex Studies   NR H-Lat (ms) Lat Norm (  ms) L-R H-Lat (ms)  Right Tibial (Gastroc)  34C     26.67 <35    EMG   Side Muscle Ins Act Fibs Psw Fasc Number Recrt Dur Dur. Amp Amp. Poly Poly. Comment  Right AntTibialis Nml 1+ Nml Nml Nml Nml Nml Nml Nml Nml Nml Nml N/A      Waveforms:

## 2019-03-27 NOTE — Progress Notes (Signed)
Nerve study does show abnormality at the nerve in the leg not in the back.  This could explain your foot drop.  Plan to discuss this further at your follow-up appointment on March 8.  However if you like to be seen sooner please schedule sooner.

## 2019-04-01 DIAGNOSIS — M21371 Foot drop, right foot: Secondary | ICD-10-CM | POA: Diagnosis not present

## 2019-04-07 ENCOUNTER — Other Ambulatory Visit: Payer: Self-pay

## 2019-04-07 ENCOUNTER — Ambulatory Visit: Payer: Medicare Other | Admitting: Family Medicine

## 2019-04-07 ENCOUNTER — Encounter: Payer: Self-pay | Admitting: Family Medicine

## 2019-04-07 DIAGNOSIS — M21371 Foot drop, right foot: Secondary | ICD-10-CM

## 2019-04-07 HISTORY — DX: Foot drop, right foot: M21.371

## 2019-04-07 NOTE — Progress Notes (Signed)
   I, [name], LAT, ATC, am serving as scribe for Dr. Lynne Leader.  Robin [name] is a 74 y.o. female who presents to [facility] today for f/u of R foot pain and R foot drop.  She was last seen by Dr. Georgina Snell on 03/10/19 and was experiencing R foot and lower leg weakness particularly of the ankle DFs and the toe extensors and numbness/tingling in the R plantar foot.  She was referred for an AFO which was made on 04/01/19 and wad referred for a LE NCV/EMG that she had on 03/26/19.  Since her last visit, pt reports that she is doing much better.  She notes that she is able to better DF her R ankle.  She states that her biggest difficulties are with going down stairs and w/ driving. She notes that she has resumed yard work.  She states that she has decreased numbness in her feet and decreased muscle spasms.  Diagnostic imaging: R foot and R tib/fib XR- 03/10/19; LE NCV/EMG- 03/26/19  Pertinent review of systems: No fevers or chills.  Relevant historical information: History restless leg syndrome   Exam:  BP (!) 148/80 (BP Location: Left Arm, Patient Position: Sitting, Cuff Size: Normal)   Pulse 79   Ht 4\' 11"  (1.499 m)   Wt 115 lb 12.8 oz (52.5 kg)   SpO2 98%   BMI 23.39 kg/m  General: Well Developed, well nourished, and in no acute distress.   MSK: Right foot and ankle normal-appearing nontender normal motion.  Foot dorsiflexion strength 4/5.  Sensation is intact distally.  Right knee normal-appearing nontender normal motion.    Lab and Radiology Results  EMG/nerve conduction study impression: 03/26/2019 This is a limited study of the right lower extremity.  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  Further characterization of the lesion cannot be performed as needle electrode examination was not completed at patient's request.  Report independently read and  verified.   Assessment and Plan: 74 y.o. female with right foot drop.  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  Patient now has a well fitted AFO which is working quite well.  Her strength is improving as well.  Plan to continue home exercise program and advance activity as tolerated.  May be able to wean out of AFO at some point in the future.  Discussed home exercise program and strategies for foot drop.  Recheck back as needed.    Discussed warning signs or symptoms. Please see discharge instructions. Patient expresses understanding.   The above documentation has been reviewed and is accurate and complete Lynne Leader

## 2019-04-07 NOTE — Patient Instructions (Signed)
Thank you for coming in today.  I would expect that you will continue to improve.  Do the home exercise.  Do the foot dorsiflexion (moving foot up) about 30 reps 2x daily.    Common Peroneal Nerve Entrapment Rehab Ask your health care provider which exercises are safe for you. Do exercises exactly as told by your health care provider and adjust them as directed. It is normal to feel mild stretching, pulling, tightness, or discomfort as you do these exercises. Stop right away if you feel sudden pain or your pain gets worse. Do not begin these exercises until told by your health care provider. Stretching and range-of-motion exercises These exercises warm up your muscles and joints and improve the movement and flexibility of your ankle. The exercises also help to relieve pain, numbness, and tingling. Gastroc stretch, standing This exercise is sometimes called a standing calf stretch. It stretches the muscles in the back of the upper calf (gastroc). To do this exercise: 1. Stand with your hands against a wall. 2. Extend your left / right leg behind you, and bend your front knee slightly. 3. Keeping your heels on the floor and your back knee straight, shift your weight slightly toward the wall. Do not arch your back. You should feel a gentle stretch in your upper left / right calf. 4. Hold this position for __________ seconds. Repeat __________ times. Complete this exercise __________ times a day. Soleus stretch, standing This exercise is sometimes called a standing calf stretch. It stretches the muscles in the back of the lower calf (soleus). To do this exercise: 1. Stand with your hands against a wall. 2. Extend your left / right leg behind you, and bend your front knee slightly. 3. Keeping your heels on the floor, bend your back knee and shift your weight slightly over your back leg. You should feel a gentle stretch deep in your lower calf. 4. Hold this position for __________ seconds. Repeat  __________ times. Complete this exercise __________ times a day. Fibular nerve floss This is an exercise in which you hold your leg out and move your foot. To do this exercise: 1. Sit with your legs straight in front of you. 2. Slowly move your left / right foot down and inward so your toes point toward the floor and toward your other foot. 3. Hold this position for __________ seconds. 4. Slowly return your foot to the starting position. Repeat __________ times. Complete this exercise __________ times a day. Hamstring stretch, standing This is an exercise in which you prop your leg on a chair and lean forward to achieve a stretch. To do this exercise: 1. Stand with your left / right heel resting on a chair. Your left / right leg should be fully extended. 2. Arch your lower back slightly. 3. Lean forward at the waist, leading with your chest, until you feel a gentle stretch in the back of your left / right knee or thigh. You should not need to lean far to feel the stretch. 4. Hold this position for __________ seconds. Repeat __________ times. Complete this exercise __________ times a day. This information is not intended to replace advice given to you by your health care provider. Make sure you discuss any questions you have with your health care provider. Document Revised: 05/08/2018 Document Reviewed: 11/14/2017 Elsevier Patient Education  [place].

## 2019-07-19 ENCOUNTER — Other Ambulatory Visit: Payer: Self-pay | Admitting: Family Medicine

## 2019-08-05 DIAGNOSIS — Z0120 Encounter for dental examination and cleaning without abnormal findings: Secondary | ICD-10-CM | POA: Diagnosis not present

## 2019-08-28 DIAGNOSIS — H5211 Myopia, right eye: Secondary | ICD-10-CM | POA: Diagnosis not present

## 2019-09-11 DIAGNOSIS — R69 Illness, unspecified: Secondary | ICD-10-CM | POA: Diagnosis not present

## 2019-09-29 ENCOUNTER — Telehealth: Payer: Self-pay | Admitting: Family Medicine

## 2019-09-29 NOTE — Telephone Encounter (Signed)
Patient has called and has states she has taken Monistat 1 OTD-1 day and did symptoms were resolved, but came back, and has taken a second Monistat 1 OTD day, and still requesting a prescription for possible yeast-like symptoms, with some burning and increasing itching and discomfort.  Patient also stated experiencing possible side effects of a rash-like symptoms on the hands and elbows.  Please advise.  Thank you kindly.  [place]

## 2019-09-29 NOTE — Telephone Encounter (Signed)
Patient is requesting a call back inference to yeast infection she believes to have.

## 2019-09-30 ENCOUNTER — Other Ambulatory Visit: Payer: Self-pay | Admitting: Family Medicine

## 2019-09-30 MED ORDER — FLUCONAZOLE 150 MG PO TABS: 150.0000 mg | ORAL_TABLET | ORAL | 1 refills | Status: DC

## 2019-09-30 NOTE — Telephone Encounter (Signed)
Sent in [place] for her to take 1 tab po weekly x 2 weeks that should take care of it. If symptoms persist she soul let us know

## 2019-09-30 NOTE — Telephone Encounter (Signed)
Patient notified of prescription has been sent to the pharmacy.

## 2019-10-01 ENCOUNTER — Telehealth: Payer: Self-pay | Admitting: Family Medicine

## 2019-10-01 ENCOUNTER — Other Ambulatory Visit: Payer: Self-pay | Admitting: Family Medicine

## 2019-10-01 ENCOUNTER — Encounter: Payer: Self-pay | Admitting: Family Medicine

## 2019-10-01 MED ORDER — METHYLPREDNISOLONE 4 MG PO TABS: ORAL_TABLET | ORAL | 0 refills | Status: DC

## 2019-10-01 NOTE — Telephone Encounter (Signed)
Patient states that she is breaking in a rash all over her body, Redness and itchy and  Burning.Patient would like a medication sent to pharmacy. Benadryl is not working.  Please Advise

## 2019-10-01 NOTE — Telephone Encounter (Signed)
I will send in a medrol dosepak and she should stop anything that is new. Also take an antihistamine such as Claritin or Zyrtec twice a day and Pepcid 20 mg twice daily for next 2 weeks. If continues to worsen then she should get looked at.

## 2019-10-02 NOTE — Telephone Encounter (Signed)
Spoke w/ Pt- informed of recommendations. Pt verbalized understanding.  

## 2019-10-02 NOTE — Telephone Encounter (Signed)
Patient called and requesting a call back.

## 2019-10-02 NOTE — Telephone Encounter (Signed)
See telephone note.

## 2019-10-07 ENCOUNTER — Other Ambulatory Visit: Payer: Self-pay

## 2019-10-07 ENCOUNTER — Ambulatory Visit (INDEPENDENT_AMBULATORY_CARE_PROVIDER_SITE_OTHER): Payer: Medicare Other | Admitting: Family

## 2019-10-07 VITALS — BP 156/86 | HR 74 | Temp 98.8°F | Resp 16 | Ht 59.0 in | Wt 111.0 lb

## 2019-10-07 DIAGNOSIS — R21 Rash and other nonspecific skin eruption: Secondary | ICD-10-CM | POA: Diagnosis not present

## 2019-10-07 DIAGNOSIS — N952 Postmenopausal atrophic vaginitis: Secondary | ICD-10-CM

## 2019-10-07 DIAGNOSIS — R300 Dysuria: Secondary | ICD-10-CM | POA: Diagnosis not present

## 2019-10-07 DIAGNOSIS — N760 Acute vaginitis: Secondary | ICD-10-CM

## 2019-10-07 LAB — POC URINALSYSI DIPSTICK (AUTOMATED)
Bilirubin, UA: NEGATIVE
Blood, UA: NEGATIVE
Glucose, UA: NEGATIVE
Ketones, UA: NEGATIVE
Leukocytes, UA: NEGATIVE
Nitrite, UA: NEGATIVE
Protein, UA: NEGATIVE
Spec Grav, UA: 1.01 (ref 1.010–1.025)
Urobilinogen, UA: 0.2 E.U./dL
pH, UA: 7 (ref 5.0–8.0)

## 2019-10-07 MED ORDER — ESTRADIOL 0.1 MG/GM VA CREA: TOPICAL_CREAM | VAGINAL | 5 refills | Status: DC

## 2019-10-07 MED FILL — ESTRADIOL 0.1 MG/GM CREA: 0.1 | 90 days supply | Qty: 43 | Fill #0

## 2019-10-07 NOTE — Progress Notes (Signed)
Subjective:    Patient ID: Robin [name], female    DOB: 1945/02/25, 74 y.o.   MRN: [id number]  HPI  Patient is a 74 yr old female who presents today with several concerns:   Skin rash- started 5 days ago- finished yesterday. Reports that her symptoms are much better.  She reports that she took a monistat 1, improvement.  2 days later she took another .    She also reports some vaginal irritation and dysuria. took diflucan this AM.  Reports no improvement with diflucan or otc monistat 1.  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   Grief reaction 06/04/2014   Hyperlipidemia    Hypertension    Medicare annual wellness visit, subsequent 06/20/2014   Medicare annual wellness visit, subsequent 05/06/2015   Osteopenia    Preventative health care 06/20/2014   Medication: Review, verify sig & reconcile(including outside meds): Duplicates discarded: DM supply source:  Preferred Pharmacy and which med where:[facility] [place], [place] 90 day supply/mail order: 90 Local pharmacy: [facility], [place]  Allergies verified:UTD  Immunization Status: Prompt   Rosacea 11/06/2016   Rotator cuff tear, right 11/26/2012     Social History   Socioeconomic History   Marital status: Widowed    Spouse name: Not on file   Number of children: Not on file   Years of education: Not on file   Highest education level: Not on file  Occupational History   Not on file  Tobacco Use   Smoking status: Former Smoker    Packs/day: 0.50    Years: 29.00    Pack years: 14.50    Types: Cigarettes    Start date: 01/03/1983   Smokeless tobacco: Never Used   Tobacco comment: 06-02-13  still smoking  Substance and Sexual Activity   Alcohol  use: Yes    Alcohol/week: 21.0 standard drinks    Types: 21 Glasses of wine per week   Drug use: No   Sexual activity: Never    Comment: no dietary restrictions, smokes, lives with husband   Other Topics Concern   Not on file  Social History Narrative   Not on file   Social Determinants of Health   Financial Resource Strain:    Difficulty of Paying Living Expenses: Not on file  Food Insecurity:    Worried About Charity fundraiser in the Last Year: Not on file   [employer] of Food in the Last Year: Not on file  Transportation Needs:    Lack of Transportation (Medical): Not on file   Lack of Transportation (Non-Medical): Not on file  Physical Activity:    Days of Exercise per Week: Not on file   Minutes of Exercise per Session: Not on file  Stress:    Feeling of Stress : Not on file  Social Connections:    Frequency of Communication with Friends and Family: Not on file   Frequency of Social Gatherings with Friends and Family: Not on file   Attends Religious Services: Not on file   Active Member of Clubs or Organizations: Not on file   Attends Archivist Meetings: Not on file   Marital Status: Not on file  Intimate  Partner Violence:    Fear of Current or Ex-Partner: Not on file   Emotionally Abused: Not on file   Physically Abused: Not on file   Sexually Abused: Not on file    Past Surgical History:  Procedure Laterality Date   CATARACT EXTRACTION Right 12/2016   EYE SURGERY Left 07/08/13   OOPHORECTOMY Right    SHOULDER ARTHROSCOPY      Family History  Problem Relation Age of Onset   Cancer Father 69       colon, preCA?   Arthritis Father        rheumatoid arthritis   Heart disease Father        stents   Stroke Maternal Grandmother    Hypertension Maternal Grandmother    Heart disease Paternal Grandfather    COPD Mother        smoker   Hypertension Mother    Arthritis Sister    Urolithiasis Sister    Heart  disease Son     Allergies  Allergen Reactions   Pravastatin Anaphylaxis    myalgias   Codeine Other (See Comments)    Current Outpatient Medications on File Prior to Visit  Medication Sig Dispense Refill   ALPRAZolam (XANAX) 0.25 MG tablet Take 0.5-1 tablets (0.125-0.25 mg total) by mouth 2 (two) times daily as needed for anxiety. 30 tablet 0   AMBULATORY NON FORMULARY MEDICATION Ankle Foot Orthosis Disp 1 Foot Drop M21.371 Fax to [facility] Fax [phone] 1 each 0   calcium-vitamin D (OSCAL WITH D) 500-200 MG-UNIT per tablet Take 1 tablet by mouth.     fish oil-omega-3 fatty acids 1000 MG capsule Take 1,200 mg by mouth daily.      fluconazole (DIFLUCAN) 150 MG tablet Take 1 tablet (150 mg total) by mouth once a week. 2 tablet 1   losartan (COZAAR) 50 MG tablet Take 1 tablet by mouth once daily 90 tablet 0   methylPREDNISolone (MEDROL) 4 MG tablet 5 tab po qd X 1d then 4 tab po qd X 1d then 3 tab po qd X 1d then 2 tab po qd then 1 tab po qd 15 tablet 0   Multiple Vitamins-Minerals (MULTIVITAMIN WITH MINERALS) tablet Take 1 tablet by mouth daily.     rosuvastatin (CRESTOR) 20 MG tablet Take 1 tablet (20 mg total) by mouth daily. 90 tablet 2   No current facility-administered medications on file prior to visit.    BP (!) 156/86 (BP Location: Right Arm, Patient Position: Sitting, Cuff Size: Small)    Pulse 74    Temp 98.8 F (37.1 C) (Oral)    Resp 16    Ht 4\' 11"  (1.499 m)    Wt 111 lb (50.3 kg)    SpO2 100%    BMI 22.42 kg/m       Objective:   Physical Exam Constitutional:      Appearance: Normal appearance. She is not ill-appearing or diaphoretic.  Skin:    General: Skin is warm and dry.  Neurological:     Mental Status: She is alert.   GU:  Pale vaginal mucosa.  Very dry labia minora and introitus Skin: mild erythematous rash noted left upper inner arm      Assessment & Plan:  Atrophic vaginitis- obtained swab wet prep (this was quite uncomfortable for  the patient due to her vaginal dryness).  Suggested that she begin use of estrace cream to help with pain and dryness.  She can apply vagisil cream and ky  lubricant to vulva as needed.  UA is unremarkable.  I think her dysuria is due to the urine burning the dry labia.   Skin rash- improving following steroid rx.  She continues antinhistamine  Monitor.   This visit occurred during the SARS-CoV-2 public health emergency.  Safety protocols were in place, including screening questions prior to the visit, additional usage of staff PPE, and extensive cleaning of exam room while observing appropriate contact time as indicated for disinfecting solutions.

## 2019-10-07 NOTE — Patient Instructions (Signed)
Please begin Estrace cream (1 gram inserted vaginally once daily for 2 weeks), then 1 gram inserted twice weekly.   You may use KY jelly as a lubricant to help you insert the applicator. For discomfort you may use Vagisil cream as needed externally.  Please call if symptoms worsen or if symptoms are not improved in 2 weeks.

## 2019-10-08 ENCOUNTER — Other Ambulatory Visit (HOSPITAL_COMMUNITY)
Admission: RE | Admit: 2019-10-08 | Discharge: 2019-10-08 | Disposition: A | Discharge status: Home / Self Care | Payer: Medicare Other | Source: Ambulatory Visit | Attending: Family | Admitting: Family

## 2019-10-08 DIAGNOSIS — N760 Acute vaginitis: Secondary | ICD-10-CM | POA: Diagnosis not present

## 2019-10-08 DIAGNOSIS — R300 Dysuria: Secondary | ICD-10-CM | POA: Diagnosis not present

## 2019-10-09 LAB — URINE CULTURE
MICRO NUMBER:: 10923792
Result:: NO GROWTH
SPECIMEN QUALITY:: ADEQUATE

## 2019-10-09 LAB — CERVICOVAGINAL ANCILLARY ONLY
Bacterial Vaginitis (gardnerella): NEGATIVE
Candida Glabrata: NEGATIVE
Candida Vaginitis: NEGATIVE
Comment: NEGATIVE
Comment: NEGATIVE
Comment: NEGATIVE

## 2019-10-27 ENCOUNTER — Other Ambulatory Visit (HOSPITAL_BASED_OUTPATIENT_CLINIC_OR_DEPARTMENT_OTHER): Payer: Self-pay | Admitting: Family Medicine

## 2019-10-27 DIAGNOSIS — Z1231 Encounter for screening mammogram for malignant neoplasm of breast: Secondary | ICD-10-CM

## 2019-10-28 ENCOUNTER — Other Ambulatory Visit: Payer: Self-pay | Admitting: Family Medicine

## 2019-10-28 DIAGNOSIS — N761 Subacute and chronic vaginitis: Secondary | ICD-10-CM

## 2019-10-29 ENCOUNTER — Ambulatory Visit (HOSPITAL_BASED_OUTPATIENT_CLINIC_OR_DEPARTMENT_OTHER)
Admission: RE | Admit: 2019-10-29 | Discharge: 2019-10-29 | Disposition: A | Discharge status: Home / Self Care | Payer: Medicare Other | Source: Ambulatory Visit | Attending: Family Medicine | Admitting: Family Medicine

## 2019-10-29 ENCOUNTER — Encounter (HOSPITAL_BASED_OUTPATIENT_CLINIC_OR_DEPARTMENT_OTHER): Payer: Self-pay

## 2019-10-29 ENCOUNTER — Other Ambulatory Visit: Payer: Self-pay

## 2019-10-29 DIAGNOSIS — Z1231 Encounter for screening mammogram for malignant neoplasm of breast: Secondary | ICD-10-CM | POA: Insufficient documentation

## 2019-10-29 HISTORY — DX: Other signs and symptoms in breast: N64.59

## 2019-11-05 ENCOUNTER — Other Ambulatory Visit: Payer: Self-pay | Admitting: Family Medicine

## 2019-11-26 ENCOUNTER — Encounter: Payer: Self-pay | Admitting: Family Medicine

## 2019-11-26 ENCOUNTER — Other Ambulatory Visit: Payer: Self-pay

## 2019-11-26 ENCOUNTER — Ambulatory Visit: Payer: Medicare Other | Admitting: Family Medicine

## 2019-11-26 VITALS — BP 132/82 | HR 90 | Temp 98.8°F | Ht 59.0 in | Wt 111.1 lb

## 2019-11-26 DIAGNOSIS — T161XXA Foreign body in right ear, initial encounter: Secondary | ICD-10-CM

## 2019-11-26 NOTE — Patient Instructions (Signed)
Don't wear your hearing aids at nighttime.   There is nothing else in your ear at this time.   Let us know if you need anything.

## 2019-11-26 NOTE — Progress Notes (Signed)
Chief Complaint  Patient presents with  . Follow-up    hearing aid tip in ear    Subjective: Patient is a 74 y.o. female here for FB in R ear.  Slept w hearing aids last night. Tried to get out a portion of hearing aid out with flushing. No pain or drainage.   Past Medical History:  Diagnosis Date  . Acute bronchitis 11/06/2015  . Arthritis   . Foot drop, right 04/07/2019   Confirmed with EMG February 2021  . 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 Grief reaction 06/04/2014  . Hyperlipidemia   . Hypertension   . Inverted nipple    Bilateral  . Medicare annual wellness visit, subsequent 06/20/2014  . Medicare annual wellness visit, subsequent 05/06/2015  . Osteopenia   . Preventative health care 06/20/2014   Medication: Review, verify sig & reconcile(including outside meds): Duplicates discarded: DM supply source:  Preferred Pharmacy and which med where:[facility] ([facility]) [place], [place] 90 day supply/mail order: 90 Local pharmacy: [facility] [place], [place]  Allergies verified:UTD  Immunization Status: Prompt  . Rosacea 11/06/2016  . Rotator cuff tear, right 11/26/2012    Objective: BP 132/82 (BP Location: Left Arm, Patient Position: Sitting, Cuff Size: Normal)   Pulse 90   Temp 98.8 F (37.1 C) (Oral)   Ht 4\' 11"  (1.499 m)   Wt 111 lb 2 oz (50.4 kg)   SpO2 97%   BMI 22.44 kg/m  General: Awake, appears stated age HEENT: Small amount of cerumen, FB visualized right in front of TM.  Lungs: No accessory muscle use Psych: Age appropriate judgment and insight, normal affect and mood  Procedure: FB removal of R ear Verbal consent obtained. Initial instrumentation attempts failed with forceps Irrigation was then attempted without successful removal of FB. I instrumented again with skin forceps and did successfully remove the FB.  The pt tolerated the procedure relatively well with no immediate  complications noted  Assessment and Plan: Foreign body of right ear, initial encounter - Plan: [place]  Don't sleep with hearing aids in.  F/u prn.  The patient voiced understanding and agreement to the plan.  [place], DO 11/26/19  3:00 PM

## 2019-11-28 ENCOUNTER — Other Ambulatory Visit: Payer: Self-pay

## 2019-11-28 ENCOUNTER — Ambulatory Visit (INDEPENDENT_AMBULATORY_CARE_PROVIDER_SITE_OTHER): Payer: Medicare Other | Admitting: Obstetrics & Gynecology

## 2019-11-28 ENCOUNTER — Other Ambulatory Visit (HOSPITAL_COMMUNITY)
Admission: RE | Admit: 2019-11-28 | Discharge: 2019-11-28 | Disposition: A | Discharge status: Home / Self Care | Payer: Medicare Other | Source: Ambulatory Visit | Attending: Obstetrics & Gynecology | Admitting: Obstetrics & Gynecology

## 2019-11-28 ENCOUNTER — Encounter: Payer: Self-pay | Admitting: Obstetrics & Gynecology

## 2019-11-28 VITALS — Wt 111.0 lb

## 2019-11-28 DIAGNOSIS — N491 Inflammatory disorders of spermatic cord, tunica vaginalis and vas deferens: Secondary | ICD-10-CM

## 2019-11-28 DIAGNOSIS — N949 Unspecified condition associated with female genital organs and menstrual cycle: Secondary | ICD-10-CM

## 2019-11-28 NOTE — Progress Notes (Signed)
History:  74 y.o. G2P0 here today for burning and swelling. She initially had sx and self managed herself for yeast. She took several OTC meds with worsening of sx. She has since been treated for yeast and her sx are now improving. She reports excessive burning which has improved recently. She did have a Medrol dose pack as it seemed that she was having a reaction to the cream that she ws prescribed.       [name]: allergies, current medications, past family history, past medical history, past social history, past surgical history and problem list.  Review of Systems:  Pertinent items are noted in HPI.    Objective:  Physical Exam Weight 111 lb (50.3 kg).  CONSTITUTIONAL: Well-developed, well-nourished female in no acute distress.  HENT:  Normocephalic, atraumatic EYES: Conjunctivae and EOM are normal. No scleral icterus.  NECK: Normal range of motion SKIN: Skin is warm and dry. No rash noted. Not diaphoretic.No pallor. [place]: Alert and oriented to person, place, and time. Normal coordination.  Pelvic: Normal appearing external genitalia; normal appearing vaginal mucosa and cervix.  Normal discharge.  Small uterus, no other palpable masses, no uterine or adnexal tenderness   Assessment & Plan:  Diagnoses and all orders for this visit:  Vaginal burning -     Cervicovaginal ancillary only( [place])  Inflammation of tunica vaginalis  I rec stopping ALL products from the vagina.  Pt to f/u in 2 weeks. If her sx persist or worsen would rec steroid use.   [place] L. Harraway-Smith, M.D., Cherlynn June

## 2019-11-28 NOTE — Progress Notes (Signed)
Patient states that she is having vaginal burning and has had this problem has been going on for two months. Kathrene Alu RN

## 2019-12-01 LAB — CERVICOVAGINAL ANCILLARY ONLY
Bacterial Vaginitis (gardnerella): NEGATIVE
Candida Glabrata: NEGATIVE
Candida Vaginitis: NEGATIVE
Comment: NEGATIVE
Comment: NEGATIVE
Comment: NEGATIVE

## 2019-12-09 DIAGNOSIS — H903 Sensorineural hearing loss, bilateral: Secondary | ICD-10-CM | POA: Diagnosis not present

## 2019-12-23 DIAGNOSIS — L821 Other seborrheic keratosis: Secondary | ICD-10-CM | POA: Diagnosis not present

## 2019-12-23 DIAGNOSIS — L570 Actinic keratosis: Secondary | ICD-10-CM | POA: Diagnosis not present

## 2020-01-05 ENCOUNTER — Encounter: Payer: Self-pay | Admitting: Obstetrics & Gynecology

## 2020-01-05 ENCOUNTER — Ambulatory Visit (INDEPENDENT_AMBULATORY_CARE_PROVIDER_SITE_OTHER): Payer: Medicare Other | Admitting: Obstetrics & Gynecology

## 2020-01-05 VITALS — BP 150/71 | HR 73 | Ht 59.0 in | Wt 112.0 lb

## 2020-01-05 DIAGNOSIS — N760 Acute vaginitis: Secondary | ICD-10-CM | POA: Diagnosis not present

## 2020-01-06 ENCOUNTER — Encounter: Payer: Self-pay | Admitting: Obstetrics & Gynecology

## 2020-01-06 NOTE — Progress Notes (Signed)
History:  74 y.o. G2P0 here today for evla of vaginitis. Pt reports that 'Thersa Salt is doing just fine now!'. She reports that she is using the topical EES 2-3 x/week. The itching and burning has completely resolve.d Seh does not like wearing the panty liners.     [name]: allergies, current medications, past family history, past medical history, past social history, past surgical history and problem list.  Review of Systems:  Pertinent items are noted in HPI.    Objective:  Physical Exam Blood pressure (!) 150/71, pulse 73, height 4\' 11"  (1.499 m), weight 112 lb (50.8 kg).  CONSTITUTIONAL: Well-developed, well-nourished female in no acute distress. She is completely delightful and very humorous.  HENT:  Normocephalic, atraumatic EYES: Conjunctivae and EOM are normal. No scleral icterus.  NECK: Normal range of motion SKIN: Skin is warm and dry. No rash noted. Not diaphoretic.No pallor. [place]: Alert and oriented to person, place, and time. Normal coordination.  Abd: Soft, nontender and nondistended Pelvic: not done    Assessment & Plan:  Vaginitis- resolved   Keep EES for 3 months 2x/week  F/u in 3 months or sooner prn  If sx still not an issue, pt may see me in 1 year.   Refilled topical EES   Total face-to-face time with patient was 18 min.  Greater than 50% was spent in counseling and coordination of care with the patient.   Lonie [place] L. Harraway-Smith, M.D., Cherlynn June

## 2020-01-29 ENCOUNTER — Other Ambulatory Visit: Payer: Self-pay | Admitting: Family Medicine

## 2020-02-11 ENCOUNTER — Other Ambulatory Visit: Payer: Self-pay

## 2020-02-12 ENCOUNTER — Ambulatory Visit (INDEPENDENT_AMBULATORY_CARE_PROVIDER_SITE_OTHER): Payer: Medicare Other | Admitting: Family Medicine

## 2020-02-12 VITALS — BP 130/74 | HR 86 | Temp 98.0°F | Resp 16 | Wt 114.0 lb

## 2020-02-12 DIAGNOSIS — I10 Essential (primary) hypertension: Secondary | ICD-10-CM

## 2020-02-12 DIAGNOSIS — R739 Hyperglycemia, unspecified: Secondary | ICD-10-CM

## 2020-02-12 DIAGNOSIS — B001 Herpesviral vesicular dermatitis: Secondary | ICD-10-CM

## 2020-02-12 DIAGNOSIS — E785 Hyperlipidemia, unspecified: Secondary | ICD-10-CM | POA: Diagnosis not present

## 2020-02-12 DIAGNOSIS — M8580 Other specified disorders of bone density and structure, unspecified site: Secondary | ICD-10-CM

## 2020-02-12 DIAGNOSIS — K589 Irritable bowel syndrome without diarrhea: Secondary | ICD-10-CM

## 2020-02-12 LAB — COMPREHENSIVE METABOLIC PANEL
ALT: 13 U/L (ref 0–35)
AST: 14 U/L (ref 0–37)
Albumin: 4.5 g/dL (ref 3.5–5.2)
Alkaline Phosphatase: 56 U/L (ref 39–117)
BUN: 11 mg/dL (ref 6–23)
CO2: 27 mEq/L (ref 19–32)
Calcium: 10 mg/dL (ref 8.4–10.5)
Chloride: 102 mEq/L (ref 96–112)
Creatinine, Ser: 0.76 mg/dL (ref 0.40–1.20)
GFR: 78.05 mL/min (ref >60.00)
Glucose, Bld: 86 mg/dL (ref 70–99)
Potassium: 4.6 mEq/L (ref 3.5–5.1)
Sodium: 134 mEq/L — ABNORMAL LOW (ref 135–145)
Total Bilirubin: 0.5 mg/dL (ref 0.2–1.2)
Total Protein: 6.8 g/dL (ref 6.0–8.3)

## 2020-02-12 LAB — LIPID PANEL
Cholesterol: 252 mg/dL — ABNORMAL HIGH (ref 0–200)
HDL: 80.5 mg/dL (ref >39.00)
LDL Cholesterol: 145 mg/dL — ABNORMAL HIGH (ref 0–99)
NonHDL: 171.41
Total CHOL/HDL Ratio: 3
Triglycerides: 134 mg/dL (ref 0.0–149.0)
VLDL: 26.8 mg/dL (ref 0.0–40.0)

## 2020-02-12 LAB — CBC
HCT: 42.4 % (ref 36.0–46.0)
Hemoglobin: 14.2 g/dL (ref 12.0–15.0)
MCHC: 33.5 g/dL (ref 30.0–36.0)
MCV: 92.5 fl (ref 78.0–100.0)
Platelets: 421 10*3/uL — ABNORMAL HIGH (ref 150.0–400.0)
RBC: 4.58 Mil/uL (ref 3.87–5.11)
RDW: 13.5 % (ref 11.5–15.5)
WBC: 9.3 10*3/uL (ref 4.0–10.5)

## 2020-02-12 LAB — TSH: TSH: 0.97 u[IU]/mL (ref 0.35–4.50)

## 2020-02-12 LAB — HEMOGLOBIN A1C: Hgb A1c MFr Bld: 5.7 % (ref 4.6–6.5)

## 2020-02-12 MED ORDER — VALACYCLOVIR HCL 500 MG PO TABS: 500.0000 mg | ORAL_TABLET | Freq: Two times a day (BID) | ORAL | 2 refills | Status: AC | PRN

## 2020-02-12 NOTE — Assessment & Plan Note (Signed)
Intermittent diarrhea which has improved this year. Was worse the first year after her husband died. At this point tolerable. Takes Electronics engineer and eats Activia. Add Benefiber once to twice a day

## 2020-02-12 NOTE — Assessment & Plan Note (Signed)
Encouraged to get adequate exercise, calcium and vitamin d intake 

## 2020-02-12 NOTE — Assessment & Plan Note (Addendum)
hgba1c acceptable, minimize simple carbs. Increase exercise as tolerated.  

## 2020-02-12 NOTE — Assessment & Plan Note (Signed)
Well controlled, no changes to meds. Encouraged heart healthy diet such as the DASH diet and exercise as tolerated.  °

## 2020-02-12 NOTE — Patient Instructions (Addendum)
Shingrix is the new shingles shot, 2 shots over 2-6 months at the pharmacy. Check with pharmacy and/or insurance regarding coverage  Need Tetanus, diptheria, pertussis (Tdap) shot every 10 years. Get [place] the record if you have one in the past 10 years and can get boosted at the pharmacy and at the office but insurance will only pay if injury occurs Consider Benefiber once to twice daily Hypertension, Adult High blood pressure (hypertension) is when the force of blood pumping through the arteries is too strong. The arteries are the blood vessels that carry blood from the heart throughout the body. Hypertension forces the heart to work harder to pump blood and may cause arteries to become narrow or stiff. Untreated or uncontrolled hypertension can cause a heart attack, heart failure, a stroke, kidney disease, and other problems. A blood pressure reading consists of a higher number over a lower number. Ideally, your blood pressure should be below 120/80. The first ("top") number is called the systolic pressure. It is a measure of the pressure in your arteries as your heart beats. The second ("bottom") number is called the diastolic pressure. It is a measure of the pressure in your arteries as the heart relaxes. What are the causes? The exact cause of this condition is not known. There are some conditions that result in or are related to high blood pressure. What increases the risk? Some risk factors for high blood pressure are under your control. The following factors may make you more likely to develop this condition:  Smoking.  Having type 2 diabetes mellitus, high cholesterol, or both.  Not getting enough exercise or physical activity.  Being overweight.  Having too much fat, sugar, calories, or salt (sodium) in your diet.  Drinking too much alcohol. Some risk factors for high blood pressure may be difficult or impossible to change. Some of these factors include:  Having chronic kidney  disease.  Having a family history of high blood pressure.  Age. Risk increases with age.  Race. You may be at higher risk if you are African American.  Gender. Men are at higher risk than women before age 39. After age 45, women are at higher risk than men.  Having obstructive sleep apnea.  Stress. What are the signs or symptoms? High blood pressure may not cause symptoms. Very high blood pressure (hypertensive crisis) may cause:  Headache.  Anxiety.  Shortness of breath.  Nosebleed.  Nausea and vomiting.  Vision changes.  Severe chest pain.  Seizures. How is this diagnosed? This condition is diagnosed by measuring your blood pressure while you are seated, with your arm resting on a flat surface, your legs uncrossed, and your feet flat on the floor. The cuff of the blood pressure monitor will be placed directly against the skin of your upper arm at the level of your heart. It should be measured at least twice using the same arm. Certain conditions can cause a difference in blood pressure between your right and left arms. Certain factors can cause blood pressure readings to be lower or higher than normal for a short period of time:  When your blood pressure is higher when you are in a health care provider's office than when you are at home, this is called white coat hypertension. Most people with this condition do not need medicines.  When your blood pressure is higher at home than when you are in a health care provider's office, this is called masked hypertension. Most people with this condition may  need medicines to control blood pressure. If you have a high blood pressure reading during one visit or you have normal blood pressure with other risk factors, you may be asked to:  Return on a different day to have your blood pressure checked again.  Monitor your blood pressure at home for 1 week or longer. If you are diagnosed with hypertension, you may have other blood or  imaging tests to help your health care provider understand your overall risk for other conditions. How is this treated? This condition is treated by making healthy lifestyle changes, such as eating healthy foods, exercising more, and reducing your alcohol intake. Your health care provider may prescribe medicine if lifestyle changes are not enough to get your blood pressure under control, and if:  Your systolic blood pressure is above 130.  Your diastolic blood pressure is above 80. Your personal target blood pressure may vary depending on your medical conditions, your age, and other factors. Follow these instructions at home: Eating and drinking  Eat a diet that is high in fiber and potassium, and low in sodium, added sugar, and fat. An example eating plan is called the DASH (Dietary Approaches to Stop Hypertension) diet. To eat this way: ? Eat plenty of fresh fruits and vegetables. Try to fill one half of your plate at each meal with fruits and vegetables. ? Eat whole grains, such as whole-wheat pasta, brown rice, or whole-grain bread. Fill about one fourth of your plate with whole grains. ? Eat or drink low-fat dairy products, such as skim milk or low-fat yogurt. ? Avoid fatty cuts of meat, processed or cured meats, and poultry with skin. Fill about one fourth of your plate with lean proteins, such as fish, chicken without skin, beans, eggs, or tofu. ? Avoid pre-made and processed foods. These tend to be higher in sodium, added sugar, and fat.  Reduce your daily sodium intake. Most people with hypertension should eat less than 1,500 mg of sodium a day.  Do not drink alcohol if: ? Your health care provider tells you not to drink. ? You are pregnant, may be pregnant, or are planning to become pregnant.  If you drink alcohol: ? Limit how much you use to:  0-1 drink a day for women.  0-2 drinks a day for men. ? Be aware of how much alcohol is in your drink. In the [place] one drink equals  one 12 oz bottle of beer (355 mL), one 5 oz glass of wine (148 mL), or one 1 oz glass of hard liquor (44 mL).   Lifestyle  Work with your health care provider to maintain a healthy body weight or to lose weight. Ask what an ideal weight is for you.  Get at least 30 minutes of exercise most days of the week. Activities may include walking, swimming, or biking.  Include exercise to strengthen your muscles (resistance exercise), such as Pilates or lifting weights, as part of your weekly exercise routine. Try to do these types of exercises for 30 minutes at least 3 days a week.  Do not use any products that contain nicotine or tobacco, such as cigarettes, e-cigarettes, and chewing tobacco. If you need help quitting, ask your health care provider.  Monitor your blood pressure at home as told by your health care provider.  Keep all follow-up visits as told by your health care provider. This is important.   Medicines  Take over-the-counter and prescription medicines only as told by your health care provider.  Follow directions carefully. Blood pressure medicines must be taken as prescribed.  Do not skip doses of blood pressure medicine. Doing this puts you at risk for problems and can make the medicine less effective.  Ask your health care provider about side effects or reactions to medicines that you should watch for. Contact a health care provider if you:  Think you are having a reaction to a medicine you are taking.  Have headaches that keep coming back (recurring).  Feel dizzy.  Have swelling in your ankles.  Have trouble with your vision. Get help right away if you:  Develop a severe headache or confusion.  Have unusual weakness or numbness.  Feel faint.  Have severe pain in your chest or abdomen.  Vomit repeatedly.  Have trouble breathing. Summary  Hypertension is when the force of blood pumping through your arteries is too strong. If this condition is not controlled, it  may put you at risk for serious complications.  Your personal target blood pressure may vary depending on your medical conditions, your age, and other factors. For most people, a normal blood pressure is less than 120/80.  Hypertension is treated with lifestyle changes, medicines, or a combination of both. Lifestyle changes include losing weight, eating a healthy, low-sodium diet, exercising more, and limiting alcohol. This information is not intended to replace advice given to you by your health care provider. Make sure you discuss any questions you have with your health care provider. Document Revised: 09/26/2017 Document Reviewed: 09/26/2017 Elsevier Patient Education  2021 [employer].

## 2020-02-12 NOTE — Assessment & Plan Note (Signed)
Encouraged heart healthy diet, increase exercise, avoid trans fats, consider a krill oil cap daily 

## 2020-02-12 NOTE — Progress Notes (Signed)
Subjective:    Patient ID: Robin [name], female    DOB: 12/03/1945, 75 y.o.   MRN: [id number]  Chief Complaint  Patient presents with  . Follow-up    HPI Patient is in today for follow up on chronic medical concerns. No recent febrile illness or hospitalizations. She has been trying to stay active and to maintain a heart healthy diet. She has been maintaining quarantine. Denies CP/palp/SOB/HA/congestion/fevers/GI or GU c/o. Taking meds as prescribed  Past Medical History:  Diagnosis Date  . Acute bronchitis 11/06/2015  . Arthritis   . Foot drop, right 04/07/2019   Confirmed with EMG February 2021  . 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 Grief reaction 06/04/2014  . Hyperlipidemia   . Hypertension   . Inverted nipple    Bilateral  . Medicare annual wellness visit, subsequent 06/20/2014  . Medicare annual wellness visit, subsequent 05/06/2015  . Osteopenia   . Preventative health care 06/20/2014   Medication: Review, verify sig & reconcile(including outside meds): Duplicates discarded: DM supply source:  Preferred Pharmacy and which med where:[facility] (MAIL ORDER) [place] [place] 90 day supply/mail order: 90 Local pharmacy: [facility], [place]  Allergies verified:UTD  Immunization Status: Prompt  . Rosacea 11/06/2016  . Rotator cuff tear, right 11/26/2012    Past Surgical History:  Procedure Laterality Date  . CATARACT EXTRACTION Right 12/2016  . EYE SURGERY Left 07/08/13  . OOPHORECTOMY Right   . SHOULDER ARTHROSCOPY      Family History  Problem Relation Age of Onset  . Cancer Father 11       colon, preCA?  Marland Kitchen Arthritis Father        rheumatoid arthritis  . Heart disease Father        stents  . Stroke Maternal Grandmother   . Hypertension Maternal Grandmother   . Heart disease Paternal Grandfather   . COPD Mother        smoker  . Hypertension Mother   . Arthritis Sister   . Urolithiasis  Sister   . Heart disease Son     Social History   Socioeconomic History  . Marital status: Widowed    Spouse name: Not on file  . Number of children: Not on file  . Years of education: Not on file  . Highest education level: Not on file  Occupational History  . Not on file  Tobacco Use  . Smoking status: Former Smoker    Packs/day: 0.50    Years: 29.00    Pack years: 14.50    Types: Cigarettes    Start date: 01/03/1983  . Smokeless tobacco: Never Used  . Tobacco comment: 06-02-13  still smoking  Vaping Use  . Vaping Use: Never used  Substance and Sexual Activity  . Alcohol use: Yes    Alcohol/week: 21.0 standard drinks    Types: 21 Glasses of wine per week  . Drug use: No  . Sexual activity: Never    Comment: no dietary restrictions, smokes, lives with husband   Other Topics Concern  . Not on file  Social History Narrative  . Not on file   Social Determinants of Health   Financial Resource Strain: Not on file  Food Insecurity: Not on file  Transportation Needs: Not on file  Physical Activity: Not on file  Stress: Not on file  Social Connections: Not on file  Intimate Partner Violence: Not on file  Outpatient Medications Prior to Visit  Medication Sig Dispense Refill  . ALPRAZolam (XANAX) 0.25 MG tablet Take 0.5-1 tablets (0.125-0.25 mg total) by mouth 2 (two) times daily as needed for anxiety. 30 tablet 0  . AMBULATORY NON FORMULARY MEDICATION Ankle Foot Orthosis Disp 1 Foot Drop M21.371 Fax to [facility] Fax [phone] 1 each 0  . calcium-vitamin D (OSCAL WITH D) 500-200 MG-UNIT per tablet Take 1 tablet by mouth.    . estradiol (ESTRACE VAGINAL) 0.1 MG/GM vaginal cream 1 gram PV once daily for 2 weeks then decrease to 1 gram PV twice weekly 42.5 g 5  . fish oil-omega-3 fatty acids 1000 MG capsule Take 1,200 mg by mouth daily.     . fluconazole (DIFLUCAN) 150 MG tablet Take 1 tablet (150 mg total) by mouth once a week. 2 tablet 1  . losartan (COZAAR) 50 MG  tablet Take 1 tablet by mouth once daily 90 tablet 0  . methylPREDNISolone (MEDROL) 4 MG tablet 5 tab po qd X 1d then 4 tab po qd X 1d then 3 tab po qd X 1d then 2 tab po qd then 1 tab po qd 15 tablet 0  . Multiple Vitamins-Minerals (MULTIVITAMIN WITH MINERALS) tablet Take 1 tablet by mouth daily.    . rosuvastatin (CRESTOR) 20 MG tablet Take 1 tablet (20 mg total) by mouth daily. 90 tablet 2   No facility-administered medications prior to visit.    Allergies  Allergen Reactions  . Pravastatin Anaphylaxis    myalgias  . Monistat [Miconazole]     ? Caused hives  . Codeine Other (See Comments)    Review of Systems  Constitutional: Negative for fever and malaise/fatigue.  HENT: Negative for congestion.   Eyes: Negative for blurred vision.  Respiratory: Negative for shortness of breath.   Cardiovascular: Negative for chest pain, palpitations and leg swelling.  Gastrointestinal: Negative for abdominal pain, blood in stool and nausea.  Genitourinary: Negative for dysuria and frequency.  Musculoskeletal: Negative for falls.  Skin: Negative for rash.  Neurological: Negative for dizziness, loss of consciousness and headaches.  Endo/Heme/Allergies: Negative for environmental allergies.  Psychiatric/Behavioral: Negative for depression. The patient is not nervous/anxious.        Objective:    Physical Exam Vitals and nursing note reviewed.  Constitutional:      General: She is not in acute distress.    Appearance: She is well-developed and well-nourished.  HENT:     Head: Normocephalic and atraumatic.     Nose: Nose normal.  Eyes:     General:        Right eye: No discharge.        Left eye: No discharge.  Cardiovascular:     Rate and Rhythm: Normal rate and regular rhythm.     Heart sounds: No murmur heard.   Pulmonary:     Effort: Pulmonary effort is normal.     Breath sounds: Normal breath sounds.  Abdominal:     General: Bowel sounds are normal.     Palpations:  Abdomen is soft.     Tenderness: There is no abdominal tenderness.  Musculoskeletal:        General: No edema.     Cervical back: Normal range of motion and neck supple.  Skin:    General: Skin is warm and dry.  Neurological:     Mental Status: She is alert and oriented to person, place, and time.  Psychiatric:        Mood and  Affect: Mood and affect normal.     BP 130/74   Pulse 86   Temp 98 F (36.7 C) (Oral)   Resp 16   Wt 114 lb (51.7 kg)   SpO2 99%   BMI 23.03 kg/m  Wt Readings from Last 3 Encounters:  02/12/20 114 lb (51.7 kg)  01/05/20 112 lb (50.8 kg)  11/28/19 111 lb (50.3 kg)    Diabetic Foot Exam - Simple   No data filed    Lab Results  Component Value Date   WBC 9.8 10/28/2018   HGB 14.1 10/28/2018   HCT 42.9 10/28/2018   PLT 385.0 10/28/2018   GLUCOSE 85 09/25/2018   CHOL 201 (H) 09/25/2018   TRIG 140.0 09/25/2018   HDL 82.80 09/25/2018   LDLDIRECT 135.0 05/08/2016   LDLCALC 90 09/25/2018   ALT 13 09/25/2018   AST 16 09/25/2018   NA 136 09/25/2018   K 5.0 09/25/2018   CL 101 09/25/2018   CREATININE 0.82 09/25/2018   BUN 15 09/25/2018   CO2 28 09/25/2018   TSH 1.07 09/25/2018   HGBA1C 5.7 09/25/2018    Lab Results  Component Value Date   TSH 1.07 09/25/2018   Lab Results  Component Value Date   WBC 9.8 10/28/2018   HGB 14.1 10/28/2018   HCT 42.9 10/28/2018   MCV 94.9 10/28/2018   PLT 385.0 10/28/2018   Lab Results  Component Value Date   NA 136 09/25/2018   K 5.0 09/25/2018   CO2 28 09/25/2018   GLUCOSE 85 09/25/2018   BUN 15 09/25/2018   CREATININE 0.82 09/25/2018   BILITOT 0.5 09/25/2018   ALKPHOS 63 09/25/2018   AST 16 09/25/2018   ALT 13 09/25/2018   PROT 6.8 09/25/2018   ALBUMIN 4.5 09/25/2018   CALCIUM 10.2 09/25/2018   GFR 68.63 09/25/2018   Lab Results  Component Value Date   CHOL 201 (H) 09/25/2018   Lab Results  Component Value Date   HDL 82.80 09/25/2018   Lab Results  Component Value Date   LDLCALC  90 09/25/2018   Lab Results  Component Value Date   TRIG 140.0 09/25/2018   Lab Results  Component Value Date   CHOLHDL 2 09/25/2018   Lab Results  Component Value Date   HGBA1C 5.7 09/25/2018       Assessment & Plan:   Problem List Items Addressed This Visit    HTN (hypertension)    Well controlled, no changes to meds. Encouraged heart healthy diet such as the DASH diet and exercise as tolerated.       Hyperlipidemia    Encouraged heart healthy diet, increase exercise, avoid trans fats, consider a krill oil cap daily      Osteopenia    Encouraged to get adequate exercise, calcium and vitamin d intake      Hyperglycemia    hgba1c acceptable, minimize simple carbs. Increase exercise as tolerated.          I am having Leontine Darroch maintain her fish oil-omega-3 fatty acids, multivitamin with minerals, calcium-vitamin D, rosuvastatin, ALPRAZolam, AMBULATORY NON FORMULARY MEDICATION, fluconazole, methylPREDNISolone, estradiol, and losartan.  No orders of the defined types were placed in this encounter.    Penni Homans, MD

## 2020-02-17 ENCOUNTER — Other Ambulatory Visit: Payer: Self-pay | Admitting: *Deleted

## 2020-02-17 DIAGNOSIS — I10 Essential (primary) hypertension: Secondary | ICD-10-CM

## 2020-02-17 DIAGNOSIS — E785 Hyperlipidemia, unspecified: Secondary | ICD-10-CM

## 2020-03-11 ENCOUNTER — Telehealth: Payer: Self-pay | Admitting: General Practice

## 2020-03-11 NOTE — Telephone Encounter (Signed)
Called pt to schedule 3 month follow up.  Pt stated that the medicine she is taking is working and she will call if she start having symptoms.  Informed pt that office will call in a few months to schedule Annual Exam and pt voiced understanding.

## 2020-04-20 ENCOUNTER — Other Ambulatory Visit: Payer: Self-pay | Admitting: Family Medicine

## 2020-05-17 ENCOUNTER — Other Ambulatory Visit (INDEPENDENT_AMBULATORY_CARE_PROVIDER_SITE_OTHER): Payer: Medicare Other

## 2020-05-17 ENCOUNTER — Other Ambulatory Visit: Payer: Self-pay | Admitting: Family Medicine

## 2020-05-17 ENCOUNTER — Other Ambulatory Visit: Payer: Self-pay

## 2020-05-17 DIAGNOSIS — E785 Hyperlipidemia, unspecified: Secondary | ICD-10-CM | POA: Diagnosis not present

## 2020-05-17 DIAGNOSIS — I10 Essential (primary) hypertension: Secondary | ICD-10-CM

## 2020-05-17 LAB — LIPID PANEL
Cholesterol: 176 mg/dL (ref 0–200)
HDL: 79 mg/dL (ref >39.00)
LDL Cholesterol: 64 mg/dL (ref 0–99)
NonHDL: 97.42
Total CHOL/HDL Ratio: 2
Triglycerides: 168 mg/dL — ABNORMAL HIGH (ref 0.0–149.0)
VLDL: 33.6 mg/dL (ref 0.0–40.0)

## 2020-05-17 LAB — COMPREHENSIVE METABOLIC PANEL
ALT: 19 U/L (ref 0–35)
AST: 20 U/L (ref 0–37)
Albumin: 4.2 g/dL (ref 3.5–5.2)
Alkaline Phosphatase: 55 U/L (ref 39–117)
BUN: 11 mg/dL (ref 6–23)
CO2: 30 mEq/L (ref 19–32)
Calcium: 10.4 mg/dL (ref 8.4–10.5)
Chloride: 102 mEq/L (ref 96–112)
Creatinine, Ser: 0.79 mg/dL (ref 0.40–1.20)
GFR: 74.37 mL/min (ref >60.00)
Glucose, Bld: 97 mg/dL (ref 70–99)
Potassium: 4.8 mEq/L (ref 3.5–5.1)
Sodium: 137 mEq/L (ref 135–145)
Total Bilirubin: 0.6 mg/dL (ref 0.2–1.2)
Total Protein: 6.7 g/dL (ref 6.0–8.3)

## 2020-07-29 ENCOUNTER — Other Ambulatory Visit: Payer: Self-pay | Admitting: Family Medicine

## 2020-08-24 ENCOUNTER — Ambulatory Visit (INDEPENDENT_AMBULATORY_CARE_PROVIDER_SITE_OTHER): Payer: Medicare Other | Admitting: Family Medicine

## 2020-08-24 ENCOUNTER — Encounter: Payer: Self-pay | Admitting: Family Medicine

## 2020-08-24 ENCOUNTER — Other Ambulatory Visit: Payer: Self-pay

## 2020-08-24 VITALS — BP 143/72 | HR 79 | Temp 98.0°F | Resp 16 | Ht 59.0 in | Wt 115.0 lb

## 2020-08-24 DIAGNOSIS — E785 Hyperlipidemia, unspecified: Secondary | ICD-10-CM | POA: Diagnosis not present

## 2020-08-24 DIAGNOSIS — I10 Essential (primary) hypertension: Secondary | ICD-10-CM

## 2020-08-24 DIAGNOSIS — R109 Unspecified abdominal pain: Secondary | ICD-10-CM

## 2020-08-24 DIAGNOSIS — E2839 Other primary ovarian failure: Secondary | ICD-10-CM

## 2020-08-24 DIAGNOSIS — Z0000 Encounter for general adult medical examination without abnormal findings: Secondary | ICD-10-CM

## 2020-08-24 DIAGNOSIS — K589 Irritable bowel syndrome without diarrhea: Secondary | ICD-10-CM

## 2020-08-24 DIAGNOSIS — M549 Dorsalgia, unspecified: Secondary | ICD-10-CM

## 2020-08-24 DIAGNOSIS — Z780 Asymptomatic menopausal state: Secondary | ICD-10-CM

## 2020-08-24 DIAGNOSIS — K219 Gastro-esophageal reflux disease without esophagitis: Secondary | ICD-10-CM

## 2020-08-24 DIAGNOSIS — D72829 Elevated white blood cell count, unspecified: Secondary | ICD-10-CM | POA: Diagnosis not present

## 2020-08-24 DIAGNOSIS — R739 Hyperglycemia, unspecified: Secondary | ICD-10-CM

## 2020-08-24 LAB — LIPID PANEL
Cholesterol: 195 mg/dL (ref 0–200)
HDL: 79.4 mg/dL (ref >39.00)
LDL Cholesterol: 93 mg/dL (ref 0–99)
NonHDL: 115.97
Total CHOL/HDL Ratio: 2
Triglycerides: 117 mg/dL (ref 0.0–149.0)
VLDL: 23.4 mg/dL (ref 0.0–40.0)

## 2020-08-24 LAB — URINALYSIS
Bilirubin Urine: NEGATIVE
Hgb urine dipstick: NEGATIVE
Ketones, ur: NEGATIVE
Leukocytes,Ua: NEGATIVE
Nitrite: NEGATIVE
Specific Gravity, Urine: <=1.005 — AB (ref 1.000–1.030)
Total Protein, Urine: NEGATIVE
Urine Glucose: NEGATIVE
Urobilinogen, UA: 0.2 (ref 0.0–1.0)
pH: 7 (ref 5.0–8.0)

## 2020-08-24 LAB — COMPREHENSIVE METABOLIC PANEL
ALT: 15 U/L (ref 0–35)
AST: 16 U/L (ref 0–37)
Albumin: 4.4 g/dL (ref 3.5–5.2)
Alkaline Phosphatase: 63 U/L (ref 39–117)
BUN: 7 mg/dL (ref 6–23)
CO2: 28 mEq/L (ref 19–32)
Calcium: 10 mg/dL (ref 8.4–10.5)
Chloride: 103 mEq/L (ref 96–112)
Creatinine, Ser: 0.75 mg/dL (ref 0.40–1.20)
GFR: 79.01 mL/min (ref >60.00)
Glucose, Bld: 92 mg/dL (ref 70–99)
Potassium: 4.6 mEq/L (ref 3.5–5.1)
Sodium: 138 mEq/L (ref 135–145)
Total Bilirubin: 0.5 mg/dL (ref 0.2–1.2)
Total Protein: 6.9 g/dL (ref 6.0–8.3)

## 2020-08-24 LAB — CBC
HCT: 39.4 % (ref 36.0–46.0)
Hemoglobin: 13.2 g/dL (ref 12.0–15.0)
MCHC: 33.4 g/dL (ref 30.0–36.0)
MCV: 92.4 fl (ref 78.0–100.0)
Platelets: 367 10*3/uL (ref 150.0–400.0)
RBC: 4.27 Mil/uL (ref 3.87–5.11)
RDW: 13.7 % (ref 11.5–15.5)
WBC: 10.1 10*3/uL (ref 4.0–10.5)

## 2020-08-24 LAB — TSH: TSH: 1.06 u[IU]/mL (ref 0.35–5.50)

## 2020-08-24 LAB — HEMOGLOBIN A1C: Hgb A1c MFr Bld: 5.8 % (ref 4.6–6.5)

## 2020-08-24 NOTE — Patient Instructions (Addendum)
Paxlovid or Molnupiravir are the new COVID medications we can give you if you get COVID so make sure you test if you have symptoms because we have to treat by day 5 of symptoms for it to be effective. If you are positive let us know so we can treat. If a home test is negative and your symptoms are persistent get a PCR test. Can check testing locations at [email or web address] If you are positive we will make an appointment with [place] and we will send in Paxlovid if you would like it. Check with your pharmacy before we meet to confirm they have it in stock, if they do not then we can get the prescription at the [place] 65 Years and Older, Female Preventive care refers to lifestyle choices and visits with your health care provider that can promote health and wellness. This includes: A yearly physical exam. This is also called an annual wellness visit. Regular dental and eye exams. Immunizations. Screening for certain conditions. Healthy lifestyle choices, such as: Eating a healthy diet. Getting regular exercise. Not using drugs or products that contain nicotine and tobacco. Limiting alcohol use. What can I expect for my preventive care visit? Physical exam Your health care provider will check your: Height and weight. These may be used to calculate your BMI (body mass index). BMI is a measurement that tells if you are at a healthy weight. Heart rate and blood pressure. Body temperature. Skin for abnormal spots. Counseling Your health care provider may ask you questions about your: Past medical problems. Family's medical history. Alcohol, tobacco, and drug use. Emotional well-being. Home life and relationship well-being. Sexual activity. Diet, exercise, and sleep habits. History of falls. Memory and ability to understand (cognition). Work and work Statistician. Pregnancy and menstrual history. Access to firearms. What immunizations do I  need?  Vaccines are usually given at various ages, according to a schedule. Your health care provider will recommend vaccines for you based on your age, medicalhistory, and lifestyle or other factors, such as travel or where you work. What tests do I need? Blood tests Lipid and cholesterol levels. These may be checked every 5 years, or more often depending on your overall health. Hepatitis C test. Hepatitis B test. Screening Lung cancer screening. You may have this screening every year starting at age 27 if you have a 30-pack-year history of smoking and currently smoke or have quit within the past 15 years. Colorectal cancer screening. All adults should have this screening starting at age 72 and continuing until age 77. Your health care provider may recommend screening at age 57 if you are at increased risk. You will have tests every 1-10 years, depending on your results and the type of screening test. Diabetes screening. This is done by checking your blood sugar (glucose) after you have not eaten for a while (fasting). You may have this done every 1-3 years. Mammogram. This may be done every 1-2 years. Talk with your health care provider about how often you should have regular mammograms. Abdominal aortic aneurysm (AAA) screening. You may need this if you are a current or former smoker. BRCA-related cancer screening. This may be done if you have a family history of breast, ovarian, tubal, or peritoneal cancers. Other tests STD (sexually transmitted disease) testing, if you are at risk. Bone density scan. This is done to screen for osteoporosis. You may have this done starting at age 71. Talk with your health care provider  about your test results, treatment options,and if necessary, the need for more tests. Follow these instructions at home: Eating and drinking  Eat a diet that includes fresh fruits and vegetables, whole grains, lean protein, and low-fat dairy products. Limit your intake  of foods with high amounts of sugar, saturated fats, and salt. Take vitamin and mineral supplements as recommended by your health care provider. Do not drink alcohol if your health care provider tells you not to drink. If you drink alcohol: Limit how much you have to 0-1 drink a day. Be aware of how much alcohol is in your drink. In the [place] one drink equals one 12 oz bottle of beer (355 mL), one 5 oz glass of wine (148 mL), or one 1 oz glass of hard liquor (44 mL).  Lifestyle Take daily care of your teeth and gums. Brush your teeth every morning and night with fluoride toothpaste. Floss one time each day. Stay active. Exercise for at least 30 minutes 5 or more days each week. Do not use any products that contain nicotine or tobacco, such as cigarettes, e-cigarettes, and chewing tobacco. If you need help quitting, ask your health care provider. Do not use drugs. If you are sexually active, practice safe sex. Use a condom or other form of protection in order to prevent STIs (sexually transmitted infections). Talk with your health care provider about taking a low-dose aspirin or statin. Find healthy ways to cope with stress, such as: Meditation, yoga, or listening to music. Journaling. Talking to a trusted person. Spending time with friends and family. Safety Always wear your seat belt while driving or riding in a vehicle. Do not drive: If you have been drinking alcohol. Do not ride with someone who has been drinking. When you are tired or distracted. While texting. Wear a helmet and other protective equipment during sports activities. If you have firearms in your house, make sure you follow all gun safety procedures. What's next? Visit your health care provider once a year for an annual wellness visit. Ask your health care provider how often you should have your eyes and teeth checked. Stay up to date on all vaccines. This information is not intended to replace advice given to you by  your health care provider. Make sure you discuss any questions you have with your healthcare provider. Document Revised: 01/07/2020 Document Reviewed: 01/10/2018 Elsevier Patient Education  2022 [employer].

## 2020-08-24 NOTE — Progress Notes (Signed)
Patient ID: Robin [name], female    DOB: 1945/05/29  Age: 75 y.o. MRN: [id number]    Subjective:  Subjective  HPI Robin [name] presents for office visit today for comprehensive physical exam today and follow up on management of chronic concerns. She reports having GI issues of heartburn which has gotten worse due to eating frequent acidic foods like tomatoes. Denies CP/palp/SOB/HA/congestion/fevers. Taking meds as prescribed.  She has lower back pain that sometimes gets severe. She states that the pain is usually worse towards the end of day, however taking tylenol helps alleviate some of the pain. She describes the pain as sharp and constant, but improves when laying down and taking OTC pain medication. 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   Her last cologuard was in February 2021 and next time she wants to do colonoscopy in 2024. She reports that her treatment for a recent UTI estradiol is not covered by insurance and is too expensive. Even though it has helped treat her UTI.  Smoked off and on in the past but at the moment she does not smole.  Review of Systems  Constitutional:  Negative for chills, fatigue and fever.  HENT:  Negative for congestion, rhinorrhea, sinus pressure, sinus pain and sore throat.   Eyes:  Negative for pain.  Respiratory:  Negative for cough and shortness of breath.   Cardiovascular:  Negative for chest pain, palpitations and leg swelling.  Gastrointestinal:  Negative for abdominal pain, blood in stool, diarrhea, nausea and vomiting.  Genitourinary:  Positive for dysuria. Negative for decreased urine volume, flank pain, frequency, vaginal bleeding and vaginal discharge.  Musculoskeletal:  Positive for back pain (lower).  Neurological:  Negative for headaches.   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   Grief reaction 06/04/2014   Hyperlipidemia    Hypertension    Inverted nipple    Bilateral   Medicare annual wellness visit, subsequent 06/20/2014   Medicare annual wellness visit, subsequent 05/06/2015   Osteopenia    Preventative health care 06/20/2014   Medication: Review, verify sig & reconcile(including outside meds): Duplicates discarded: DM supply source:  Preferred Pharmacy and which med where:[facility] [place], [place] 90 day supply/mail order: 90 Local pharmacy: [place], [place]  Allergies verified:UTD  Immunization Status: Prompt   Rosacea 11/06/2016   Rotator cuff tear, right 11/26/2012    She has a past surgical history that includes Shoulder arthroscopy; Oophorectomy (Right); Eye surgery (Left, 07/08/13); and Cataract extraction (Right, 12/2016).   Her family history includes Arthritis in her father and sister; COPD in her mother; Cancer (age of onset: 12) in her father; Heart disease in her father, paternal grandfather, and son; Hypertension in her maternal grandmother and mother; Stroke in her maternal grandmother; Urolithiasis in her sister.She reports that she has quit smoking. Her smoking use included cigarettes. She started smoking about 37 years ago. She has a 14.50 pack-year smoking history. She has never used smokeless tobacco. She reports current alcohol use of about 21.0 standard drinks of alcohol per week. She reports that she does not use drugs.  Current Outpatient Medications on File Prior to Visit  Medication Sig  Dispense Refill   ALPRAZolam (XANAX) 0.25 MG tablet Take 0.5-1 tablets (0.125-0.25 mg total) by mouth 2 (two) times daily as needed for anxiety. 30 tablet 0   AMBULATORY NON FORMULARY MEDICATION Ankle Foot Orthosis Disp 1 Foot Drop M21.371 Fax to [facility] Fax [phone] 1 each 0   calcium-vitamin D  (OSCAL WITH D) 500-200 MG-UNIT per tablet Take 1 tablet by mouth.     estradiol (ESTRACE VAGINAL) 0.1 MG/GM vaginal cream 1 gram PV once daily for 2 weeks then decrease to 1 gram PV twice weekly 42.5 g 5   famotidine (PEPCID) 40 MG tablet Take 40 mg by mouth daily.     fish oil-omega-3 fatty acids 1000 MG capsule Take 1,200 mg by mouth daily.      fluconazole (DIFLUCAN) 150 MG tablet Take 1 tablet (150 mg total) by mouth once a week. 2 tablet 1   losartan (COZAAR) 50 MG tablet Take 1 tablet by mouth once daily 90 tablet 0   methylPREDNISolone (MEDROL) 4 MG tablet 5 tab po qd X 1d then 4 tab po qd X 1d then 3 tab po qd X 1d then 2 tab po qd then 1 tab po qd 15 tablet 0   Multiple Vitamins-Minerals (MULTIVITAMIN WITH MINERALS) tablet Take 1 tablet by mouth daily.     Probiotic Product (ALIGN EXTRA STRENGTH PO) Take by mouth.     rosuvastatin (CRESTOR) 20 MG tablet Take 1 tablet by mouth once daily 90 tablet 1   valACYclovir (VALTREX) 500 MG tablet Take 1 tablet (500 mg total) by mouth 2 (two) times daily as needed. 60 tablet 2   No current facility-administered medications on file prior to visit.     Objective:  Objective  Physical Exam Constitutional:      General: She is not in acute distress.    Appearance: Normal appearance. She is not ill-appearing or toxic-appearing.  HENT:     Head: Normocephalic and atraumatic.     Right Ear: Tympanic membrane, ear canal and external ear normal.     Left Ear: Tympanic membrane, ear canal and external ear normal.     Nose: No congestion or rhinorrhea.  Eyes:     Extraocular Movements: Extraocular movements intact.     Right eye: No nystagmus.     Left eye: No nystagmus.     Pupils: Pupils are equal, round, and reactive to light.  Cardiovascular:     Rate and Rhythm: Normal rate and regular rhythm.     Pulses: Normal pulses.          Posterior tibial pulses are 2+ on the right side and 2+ on the left side.     Heart sounds: Normal heart sounds.  No murmur heard. Pulmonary:     Effort: Pulmonary effort is normal. No respiratory distress.     Breath sounds: Normal breath sounds. No wheezing, rhonchi or rales.  Abdominal:     General: Bowel sounds are normal.     Palpations: Abdomen is soft. There is no mass.     Tenderness: no abdominal tenderness There is no guarding.     Hernia: No hernia is present.  Musculoskeletal:        General: Normal range of motion.     Cervical back: Normal range of motion and neck supple.  Skin:    General: Skin is warm and dry.  Neurological:     Mental Status: She is alert and oriented to person, place, and time.  Cranial Nerves: No facial asymmetry.     Motor: Motor function is intact. No weakness.  Psychiatric:        Behavior: Behavior normal.   BP (!) 143/72   Pulse 79   Temp 98 F (36.7 C)   Resp 16   Ht '4\' 11"'$  (1.499 m)   Wt 115 lb (52.2 kg)   SpO2 99%   BMI 23.23 kg/m  Wt Readings from Last 3 Encounters:  08/24/20 115 lb (52.2 kg)  02/12/20 114 lb (51.7 kg)  01/05/20 112 lb (50.8 kg)     Lab Results  Component Value Date   WBC 10.1 08/24/2020   HGB 13.2 08/24/2020   HCT 39.4 08/24/2020   PLT 367.0 08/24/2020   GLUCOSE 92 08/24/2020   CHOL 195 08/24/2020   TRIG 117.0 08/24/2020   HDL 79.40 08/24/2020   LDLDIRECT 135.0 05/08/2016   LDLCALC 93 08/24/2020   ALT 15 08/24/2020   AST 16 08/24/2020   NA 138 08/24/2020   K 4.6 08/24/2020   CL 103 08/24/2020   CREATININE 0.75 08/24/2020   BUN 7 08/24/2020   CO2 28 08/24/2020   TSH 1.06 08/24/2020   HGBA1C 5.8 08/24/2020    No results found.   Assessment & Plan:  Plan    No orders of the defined types were placed in this encounter.   Problem List Items Addressed This Visit     HTN (hypertension)    Slightly elevated here today but well controlled at home. no changes to meds. Encouraged heart healthy diet such as the DASH diet and exercise as tolerated.        Relevant Orders   CBC (Completed)    Comprehensive metabolic panel (Completed)   TSH (Completed)   Hyperlipidemia    Encourage heart healthy diet such as MIND or DASH diet, increase exercise, avoid trans fats, simple carbohydrates and processed foods, consider a krill or fish or flaxseed oil cap daily. Tolerating Rosuvastatin       Relevant Orders   Lipid panel (Completed)   GERD (gastroesophageal reflux disease)    Avoid offending foods, start probiotics. Do not eat large meals in late evening and consider raising head of bed. Famotidine 40 mg qhs       Relevant Medications   famotidine (PEPCID) 40 MG tablet   Probiotic Product (ALIGN EXTRA STRENGTH PO)   Leukocytosis   Relevant Orders   CBC (Completed)   Preventative health care - Primary    Patient encouraged to maintain heart healthy diet, regular exercise, adequate sleep. Consider daily probiotics. Take medications as prescribed. Labs ordered and reviewed. Encouraged second COVID booster and Shingrix shots. MGM was 9/29;/21 repeat in 1-2 years. Cologuard 2021 repeat in 2024 or proceed with colonoscopy. Dexa scan 2018 repeat in 2022 or 2023       Hyperglycemia    hgba1c acceptable, minimize simple carbs. Increase exercise as tolerated.        Relevant Orders   Hemoglobin A1c (Completed)   IBS (irritable bowel syndrome)    Encouraged to avoid offending foods and add a probiotic and fiber supplement, small frequent meals, report worsening symptoms       Relevant Medications   famotidine (PEPCID) 40 MG tablet   Probiotic Product (ALIGN EXTRA STRENGTH PO)   Back pain    Encouraged moist heat and gentle stretching as tolerated. May try NSAIDs and prescription meds as directed and report if symptoms worsen or seek immediate care. No falls or trauma  Other Visit Diagnoses     Bilateral flank pain       Relevant Orders   CBC (Completed)   Comprehensive metabolic panel (Completed)   Urinalysis (Completed)   Urine Culture (Completed)   Estrogen  deficiency       Relevant Orders   DG Bone Density   Post-menopausal       Relevant Orders   DG Bone Density       Follow-up: Return in about 6 months (around 02/24/2021).  I, Suezanne Jacquet, acting as a scribe for Penni Homans, MD, have documented all relevent documentation on behalf of Penni Homans, MD, as directed by Penni Homans, MD while in the presence of Penni Homans, MD.  I, Mosie Lukes, MD personally performed the services described in this documentation. All medical record entries made by the scribe were at my direction and in my presence. I have reviewed the chart and agree that the record reflects my personal performance and is accurate and complete

## 2020-08-24 NOTE — Assessment & Plan Note (Addendum)
hgba1c acceptable, minimize simple carbs. Increase exercise as tolerated.  

## 2020-08-24 NOTE — Assessment & Plan Note (Addendum)
Encouraged to avoid offending foods and add a probiotic and fiber supplement, small frequent meals, report worsening symptoms

## 2020-08-25 LAB — URINE CULTURE
MICRO NUMBER:: 12163699
Result:: NO GROWTH
SPECIMEN QUALITY:: ADEQUATE

## 2020-08-29 DIAGNOSIS — M549 Dorsalgia, unspecified: Secondary | ICD-10-CM | POA: Insufficient documentation

## 2020-08-29 NOTE — Assessment & Plan Note (Signed)
Avoid offending foods, start probiotics. Do not eat large meals in late evening and consider raising head of bed. Famotidine 40 mg qhs

## 2020-08-29 NOTE — Assessment & Plan Note (Signed)
Encouraged moist heat and gentle stretching as tolerated. May try NSAIDs and prescription meds as directed and report if symptoms worsen or seek immediate care. No falls or trauma

## 2020-08-29 NOTE — Assessment & Plan Note (Signed)
Slightly elevated here today but well controlled at home. no changes to meds. Encouraged heart healthy diet such as the DASH diet and exercise as tolerated.

## 2020-08-29 NOTE — Assessment & Plan Note (Signed)
Encourage heart healthy diet such as MIND or DASH diet, increase exercise, avoid trans fats, simple carbohydrates and processed foods, consider a krill or fish or flaxseed oil cap daily.  Tolerating Rosuvastatin 

## 2020-08-29 NOTE — Assessment & Plan Note (Addendum)
Patient encouraged to maintain heart healthy diet, regular exercise, adequate sleep. Consider daily probiotics. Take medications as prescribed. Labs ordered and reviewed. Encouraged second COVID booster and Shingrix shots. MGM was 9/29;/21 repeat in 1-2 years. Cologuard 2021 repeat in 2024 or proceed with colonoscopy. Dexa scan 2018 repeat in 2022 or 2023

## 2020-09-27 ENCOUNTER — Other Ambulatory Visit (HOSPITAL_BASED_OUTPATIENT_CLINIC_OR_DEPARTMENT_OTHER): Payer: Self-pay | Admitting: Family Medicine

## 2020-09-27 ENCOUNTER — Other Ambulatory Visit: Payer: Self-pay

## 2020-09-27 ENCOUNTER — Ambulatory Visit (HOSPITAL_BASED_OUTPATIENT_CLINIC_OR_DEPARTMENT_OTHER)
Admission: RE | Admit: 2020-09-27 | Discharge: 2020-09-27 | Disposition: A | Discharge status: Home / Self Care | Payer: Medicare Other | Source: Ambulatory Visit | Attending: Family Medicine | Admitting: Family Medicine

## 2020-09-27 DIAGNOSIS — M810 Age-related osteoporosis without current pathological fracture: Secondary | ICD-10-CM | POA: Diagnosis not present

## 2020-09-27 DIAGNOSIS — M8589 Other specified disorders of bone density and structure, multiple sites: Secondary | ICD-10-CM | POA: Diagnosis not present

## 2020-09-27 DIAGNOSIS — E2839 Other primary ovarian failure: Secondary | ICD-10-CM | POA: Insufficient documentation

## 2020-09-27 DIAGNOSIS — Z780 Asymptomatic menopausal state: Secondary | ICD-10-CM | POA: Diagnosis not present

## 2020-09-27 DIAGNOSIS — Z1231 Encounter for screening mammogram for malignant neoplasm of breast: Secondary | ICD-10-CM

## 2020-10-25 ENCOUNTER — Encounter: Payer: Self-pay | Admitting: Family Medicine

## 2020-11-01 ENCOUNTER — Encounter (HOSPITAL_BASED_OUTPATIENT_CLINIC_OR_DEPARTMENT_OTHER): Payer: Self-pay

## 2020-11-01 ENCOUNTER — Ambulatory Visit (HOSPITAL_BASED_OUTPATIENT_CLINIC_OR_DEPARTMENT_OTHER)
Admission: RE | Admit: 2020-11-01 | Discharge: 2020-11-01 | Disposition: A | Discharge status: Home / Self Care | Payer: Medicare Other | Source: Ambulatory Visit | Attending: Family Medicine | Admitting: Family Medicine

## 2020-11-01 ENCOUNTER — Other Ambulatory Visit: Payer: Self-pay

## 2020-11-01 DIAGNOSIS — Z1231 Encounter for screening mammogram for malignant neoplasm of breast: Secondary | ICD-10-CM | POA: Diagnosis not present

## 2020-11-12 ENCOUNTER — Other Ambulatory Visit: Payer: Self-pay | Admitting: Family Medicine

## 2020-11-17 ENCOUNTER — Other Ambulatory Visit (HOSPITAL_BASED_OUTPATIENT_CLINIC_OR_DEPARTMENT_OTHER): Payer: Self-pay

## 2020-11-17 MED ORDER — INFLUENZA VAC A&B SA ADJ QUAD 0.5 ML IM PRSY
PREFILLED_SYRINGE | INTRAMUSCULAR | 0 refills | Status: DC
  Filled 2020-11-17: qty 0.5, 1d supply, fill #0

## 2020-12-01 ENCOUNTER — Ambulatory Visit: Payer: Medicare Other | Attending: Internal Medicine

## 2020-12-01 DIAGNOSIS — Z23 Encounter for immunization: Secondary | ICD-10-CM

## 2020-12-01 NOTE — Progress Notes (Signed)
   Covid-19 Vaccination Clinic  Name:  Robin [name]    MRN: [id number] DOB: 1946-01-03  12/01/2020  [name] was observed post Covid-19 immunization for 15 minutes without incident. She was provided with Vaccine Information Sheet and instruction to access the V-Safe system.   [name] was instructed to call 911 with any severe reactions post vaccine: Difficulty breathing  Swelling of face and throat  A fast heartbeat  A bad rash all over body  Dizziness and weakness   Immunizations Administered     Name Date Dose VIS Date Route   Moderna Covid-19 vaccine Bivalent Booster 12/01/2020 11:06 AM 0.5 mL 09/11/2020 Intramuscular   Manufacturer: Levan Hurst   Lot: [id number]   NDC: [place], PharmD, MBA Clinical Acute Care Pharmacist

## 2020-12-21 ENCOUNTER — Other Ambulatory Visit (HOSPITAL_BASED_OUTPATIENT_CLINIC_OR_DEPARTMENT_OTHER): Payer: Self-pay

## 2020-12-21 DIAGNOSIS — L814 Other melanin hyperpigmentation: Secondary | ICD-10-CM | POA: Diagnosis not present

## 2020-12-21 DIAGNOSIS — W908XXS Exposure to other nonionizing radiation, sequela: Secondary | ICD-10-CM | POA: Diagnosis not present

## 2020-12-21 DIAGNOSIS — L578 Other skin changes due to chronic exposure to nonionizing radiation: Secondary | ICD-10-CM | POA: Diagnosis not present

## 2020-12-21 DIAGNOSIS — D239 Other benign neoplasm of skin, unspecified: Secondary | ICD-10-CM | POA: Diagnosis not present

## 2020-12-21 MED ORDER — MODERNA COVID-19 BIVAL BOOSTER 50 MCG/0.5ML IM SUSP
INTRAMUSCULAR | 0 refills | Status: DC
  Filled 2020-12-21: qty 0.5, 1d supply, fill #0

## 2021-01-10 ENCOUNTER — Other Ambulatory Visit: Payer: Self-pay

## 2021-01-10 ENCOUNTER — Encounter: Payer: Self-pay | Admitting: Obstetrics & Gynecology

## 2021-01-10 ENCOUNTER — Ambulatory Visit (INDEPENDENT_AMBULATORY_CARE_PROVIDER_SITE_OTHER): Payer: Medicare Other | Admitting: Obstetrics & Gynecology

## 2021-01-10 VITALS — BP 116/75 | HR 79 | Ht 59.0 in | Wt 118.0 lb

## 2021-01-10 DIAGNOSIS — Z01419 Encounter for gynecological examination (general) (routine) without abnormal findings: Secondary | ICD-10-CM

## 2021-01-10 NOTE — Progress Notes (Signed)
Subjective:     Robin [name] is a 75 y.o. female here for a routine exam.  Current complaints: none. Pt has been taking the topical EES but, had not used it in the past 2 months and notes no difference now. Does not plan on refilling meds. Reports that she has used [employer] with resolution of sx. Pt is widowed and is not sexually active.      Gynecologic History No LMP recorded. Patient is postmenopausal. Contraception: post menopausal status Last Pap: >10 years. No prior h/o abnormal PAPs Last mammogram: 10/2020 Results were: normal  Obstetric History OB History  Gravida Para Term Preterm AB Living  2         2  SAB IAB Ectopic Multiple Live Births               # Outcome Date GA Lbr Len/2nd Weight Sex Delivery Anes PTL Lv  2 Gravida           1 Gravida             [name]: allergies, current medications, past family history, past medical history, past social history, past surgical history, and problem list.  Review of Systems Pertinent items are noted in HPI.    Objective:  BP 116/75   Pulse 79   Ht 4\' 11"  (1.499 m)   Wt 118 lb (53.5 kg)   BMI 23.83 kg/m   General Appearance:    Alert, cooperative, no distress, appears stated age  Head:    Normocephalic, without obvious abnormality, atraumatic  Eyes:    conjunctiva/corneas clear, EOM's intact, both eyes  Ears:    Normal external ear canals, both ears  Nose:   Nares normal, septum midline, mucosa normal, no drainage    or sinus tenderness  Throat:   Lips, mucosa, and tongue normal; teeth and gums normal  Neck:   Supple, symmetrical, trachea midline, no adenopathy;    thyroid:  no enlargement/tenderness/nodules  Back:     Symmetric, no curvature, ROM normal, no CVA tenderness  Lungs:     respirations unlabored  Chest Wall:    No tenderness or deformity   Heart:    Regular rate and rhythm  Breast Exam:    No tenderness, masses, or nipple  abnormality  Abdomen:     Soft, non-tender, bowel sounds active all four quadrants,    no masses, no organomegaly  Genitalia:    Normal female without lesion, discharge or tenderness     Extremities:   Extremities normal, atraumatic, no cyanosis or edema  Pulses:   2+ and symmetric all extremities  Skin:   Skin color, texture, turgor normal, no rashes or lesions     Assessment:    Healthy female exam.  Genitourinary syndrome of menopause    Plan:   Rec coconut oil prn Rec Dove unscented soap as needed.  F/u in 1 year or sooner prn   Jaidon Ellery L. Harraway-Smith, M.D., Cherlynn June

## 2021-02-28 ENCOUNTER — Ambulatory Visit (INDEPENDENT_AMBULATORY_CARE_PROVIDER_SITE_OTHER): Payer: Medicare Other | Admitting: Family Medicine

## 2021-02-28 ENCOUNTER — Encounter: Payer: Self-pay | Admitting: Family Medicine

## 2021-02-28 VITALS — BP 126/84 | HR 73 | Temp 98.9°F | Resp 16 | Ht 59.0 in | Wt 118.6 lb

## 2021-02-28 DIAGNOSIS — I10 Essential (primary) hypertension: Secondary | ICD-10-CM | POA: Diagnosis not present

## 2021-02-28 DIAGNOSIS — K589 Irritable bowel syndrome without diarrhea: Secondary | ICD-10-CM

## 2021-02-28 DIAGNOSIS — R739 Hyperglycemia, unspecified: Secondary | ICD-10-CM

## 2021-02-28 DIAGNOSIS — M810 Age-related osteoporosis without current pathological fracture: Secondary | ICD-10-CM

## 2021-02-28 DIAGNOSIS — E785 Hyperlipidemia, unspecified: Secondary | ICD-10-CM | POA: Diagnosis not present

## 2021-02-28 NOTE — Assessment & Plan Note (Signed)
Encouraged to get adequate exercise, calcium and vitamin d intake 

## 2021-02-28 NOTE — Assessment & Plan Note (Signed)
hgba1c acceptable, minimize simple carbs. Increase exercise as tolerated.  

## 2021-02-28 NOTE — Patient Instructions (Addendum)
Sarna anti itch lotion Hydrate 60-80 ounces Magnesium Glycinate 200-400 mg at bedtime Consider fiber supplements such as gummies, Benefiber, Metamucil daily to increase   Shingrix is the new shingles shot, 2 shots over 2-6 months, confirm coverage with insurance and document, then can return here for shots with nurse appt or at pharmacy   Neutrogena Tgel shampoo or selsun blue  For the osteoporosis:   Recommend calcium intake of 1200 to 1500 mg daily, divided into roughly 3 doses. Best source is the diet and a single dairy serving is about 500 mg, a supplement of calcium citrate once or twice daily to balance diet is fine if not getting enough in diet. Also need Vitamin D 2000 IU caps, 1 cap daily if not already taking vitamin D. Also recommend weight baring exercise on hips and upper body to keep bones strong    Pruritus Pruritus is an itchy feeling on the skin. One of the most common causes is dry skin, but many different things can cause itching. Most cases of itching do not require medical attention. Sometimes itchy skin can turn into a rash. Follow these instructions at home: Skin care  Apply moisturizing lotion to your skin as needed. Lotion that contains petroleum jelly is best. Take medicines or apply medicated creams only as told by your health care provider. This may include: Corticosteroid cream. Anti-itch lotions. Oral antihistamines. Apply a cool, wet cloth (cool compress) to the affected areas. Take baths with one of the following: Epsom salts. You can get these at your local pharmacy or grocery store. Follow the instructions on the packaging. Baking soda. Pour a small amount into the bath as told by your health care provider. Colloidal oatmeal. You can get this at your local pharmacy or grocery store. Follow the instructions on the packaging. Apply baking soda paste to your skin. To make the paste, stir water into a small amount of baking soda until it reaches a paste-like  consistency. Do not scratch your skin. Do not take hot showers or baths, which can make itching worse. A cool shower may help with itching as long as you apply moisturizing lotion after the shower. Do not use scented soaps, detergents, perfumes, and cosmetic products. Instead, use gentle, unscented versions of these items. General instructions Avoid wearing tight clothes. Keep a journal to help find out what is causing your itching. Write down: What you eat and drink. What cosmetic products you use. What soaps or detergents you use. What you wear, including jewelry. Use a humidifier. This keeps the air moist, which helps to prevent dry skin. Be aware of any changes in your itchiness. Contact a health care provider if: The itching does not go away after several days. You are unusually thirsty or urinating more than normal. Your skin tingles or feels numb. Your skin or the white parts of your eyes turn yellow (jaundice). You feel weak. You have any of the following: Night sweats. Tiredness (fatigue). Weight loss. Abdominal pain. Summary Pruritus is an itchy feeling on the skin. One of the most common causes is dry skin, but many different conditions and factors can cause itching. Apply moisturizing lotion to your skin as needed. Lotion that contains petroleum jelly is best. Take medicines or apply medicated creams only as told by your health care provider. Do not take hot showers or baths. Do not use scented soaps, detergents, perfumes, or cosmetic products. This information is not intended to replace advice given to you by your health care provider.  Make sure you discuss any questions you have with your health care provider. Document Revised: 01/30/2017 Document Reviewed: 01/30/2017 Elsevier Patient Education  2022 [employer].

## 2021-02-28 NOTE — Assessment & Plan Note (Addendum)
Improved on recheck, Well controlled, no changes to meds. Encouraged heart healthy diet such as the DASH diet and exercise as tolerated. Numbers at home generally, 140-145/ 65-70. No changes today

## 2021-02-28 NOTE — Assessment & Plan Note (Signed)
Encourage heart healthy diet such as MIND or DASH diet, increase exercise, avoid trans fats, simple carbohydrates and processed foods, consider a krill or fish or flaxseed oil cap daily.  °

## 2021-02-28 NOTE — Assessment & Plan Note (Signed)
Encouraged to hydrate and add a fiber supplement

## 2021-02-28 NOTE — Progress Notes (Signed)
Subjective:   By signing my name below, I, Robin [name], attest that this documentation has been prepared under the direction and in the presence of Mosie Lukes, MD. 02/28/2021     Patient ID: Robin [name], female    DOB: 06-29-1945, 76 y.o.   MRN: [id number]  Chief Complaint  Patient presents with   6 months follow up    HPI Patient is in today for an office visit and 6 month f/u.  She reports she is not sleeping properly through the night. She wakes up often during the night to urinate and has a hard time getting back to sleep. She also wakes up often because her skin and scalp are often itching and dry.   She also reports that since starting her gym routine, there has been less back and joint pain. Her last dexa scan showed she was osteoporotic but does not want to start calcium pills. She also denies abdominal problems and is no longer constipated.   She checks her blood pressure at home and the readings are 140-145/76-77. Denies symptoms at home. Her blood pressure is a little elevated at this visit. BP Readings from Last 3 Encounters:  02/28/21 126/84  01/10/21 116/75  08/24/20 (!) 143/72    Recheck- 126/84  She has 4 Covid-19 vaccines at this time. She is not UTD on the shingles vaccine.   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   Grief reaction 06/04/2014   Hyperlipidemia    Hypertension    Inverted nipple    Bilateral   Medicare annual wellness visit, subsequent 06/20/2014   Medicare annual wellness visit, subsequent 05/06/2015   Osteopenia    Preventative health care 06/20/2014   Medication: Review, verify sig & reconcile(including outside meds): Duplicates discarded: DM supply source:  Preferred Pharmacy and which med where:[facility] [place], [place] 90 day supply/mail order: 90 Local  pharmacy: [facility], [place]  Allergies verified:UTD  Immunization Status: Prompt   Rosacea 11/06/2016   Rotator cuff tear, right 11/26/2012    Past Surgical History:  Procedure Laterality Date   CATARACT EXTRACTION Right 12/2016   EYE SURGERY Left 07/08/13   OOPHORECTOMY Right    SHOULDER ARTHROSCOPY      Family History  Problem Relation Age of Onset   Cancer Father 34       colon, preCA?   Arthritis Father        rheumatoid arthritis   Heart disease Father        stents   Stroke Maternal Grandmother    Hypertension Maternal Grandmother    Heart disease Paternal Grandfather    COPD Mother        smoker   Hypertension Mother    Arthritis Sister    Urolithiasis Sister    Heart disease Son     Social History   Socioeconomic History   Marital status: Widowed    Spouse name: Not on file   Number of children: Not on file   Years of education: Not on file   Highest education level: Not on file  Occupational History   Not on file  Tobacco Use   Smoking status: Former    Packs/day: 0.50    Years: 29.00  Pack years: 14.50    Types: Cigarettes    Start date: 01/03/1983   Smokeless tobacco: Never   Tobacco comments:    06-02-13  still smoking  Vaping Use   Vaping Use: Never used  Substance and Sexual Activity   Alcohol use: Yes    Alcohol/week: 21.0 standard drinks    Types: 21 Glasses of wine per week   Drug use: No   Sexual activity: Never    Comment: no dietary restrictions, smokes, lives with husband   Other Topics Concern   Not on file  Social History Narrative   Not on file   Social Determinants of Health   Financial Resource Strain: Not on file  Food Insecurity: Not on file  Transportation Needs: Not on file  Physical Activity: Not on file  Stress: Not on file  Social Connections: Not on file  Intimate Partner Violence: Not on file    Outpatient Medications Prior to Visit  Medication Sig  Dispense Refill   calcium-vitamin D (OSCAL WITH D) 500-200 MG-UNIT per tablet Take 1 tablet by mouth.     famotidine (PEPCID) 40 MG tablet Take 40 mg by mouth daily.     losartan (COZAAR) 50 MG tablet Take 1 tablet by mouth once daily 90 tablet 1   Multiple Vitamins-Minerals (MULTIVITAMIN WITH MINERALS) tablet Take 1 tablet by mouth daily.     Probiotic Product (ALIGN EXTRA STRENGTH PO) Take by mouth.     rosuvastatin (CRESTOR) 20 MG tablet Take 1 tablet by mouth once daily 90 tablet 1   valACYclovir (VALTREX) 500 MG tablet Take 1 tablet (500 mg total) by mouth 2 (two) times daily as needed. 60 tablet 2   ALPRAZolam (XANAX) 0.25 MG tablet Take 0.5-1 tablets (0.125-0.25 mg total) by mouth 2 (two) times daily as needed for anxiety. (Patient not taking: Reported on 01/10/2021) 30 tablet 0   fish oil-omega-3 fatty acids 1000 MG capsule Take 1,200 mg by mouth daily.  (Patient not taking: Reported on 01/10/2021)     fluconazole (DIFLUCAN) 150 MG tablet Take 1 tablet (150 mg total) by mouth once a week. (Patient not taking: Reported on 01/10/2021) 2 tablet 1   influenza vaccine adjuvanted (FLUAD) 0.5 ML injection Inject into the muscle. (Patient not taking: Reported on 01/10/2021) 0.5 mL 0   AMBULATORY NON FORMULARY MEDICATION Ankle Foot Orthosis Disp 1 Foot Drop M21.371 Fax to [facility] Fax [phone] (Patient not taking: Reported on 01/10/2021) 1 each 0   cholecalciferol (VITAMIN D3) 25 MCG (1000 UNIT) tablet Take 1,000 Units by mouth daily.     COVID-19 mRNA bivalent vaccine, Moderna, (MODERNA COVID-19 BIVAL BOOSTER) 50 MCG/0.5ML injection Inject into the muscle. (Patient not taking: Reported on 01/10/2021) 0.5 mL 0   estradiol (ESTRACE VAGINAL) 0.1 MG/GM vaginal cream 1 gram PV once daily for 2 weeks then decrease to 1 gram PV twice weekly (Patient not taking: Reported on 01/10/2021) 42.5 g 5   methylPREDNISolone (MEDROL) 4 MG tablet 5 tab po qd X 1d then 4 tab po qd X 1d then 3 tab po qd X 1d  then 2 tab po qd then 1 tab po qd 15 tablet 0   vitamin C (ASCORBIC ACID) 500 MG tablet Take 500 mg by mouth daily.     No facility-administered medications prior to visit.    Allergies  Allergen Reactions   Pravastatin Anaphylaxis    myalgias   Monistat [Miconazole]     ? Caused hives   Codeine Other (See  Comments)    Review of Systems  Constitutional:  Negative for fever and malaise/fatigue.  HENT:  Negative for congestion.   Eyes:  Negative for redness.  Respiratory:  Negative for shortness of breath.   Cardiovascular:  Negative for chest pain, palpitations and leg swelling.  Gastrointestinal:  Negative for abdominal pain, blood in stool and nausea.  Genitourinary:  Positive for frequency. Negative for dysuria.  Musculoskeletal:  Negative for falls.  Skin:  Positive for itching. Negative for rash.  Neurological:  Negative for dizziness, loss of consciousness and headaches.  Endo/Heme/Allergies:  Negative for polydipsia.  Psychiatric/Behavioral:  Negative for depression. The patient is not nervous/anxious.       Objective:    Physical Exam Constitutional:      General: She is not in acute distress.    Appearance: She is well-developed.  HENT:     Head: Normocephalic and atraumatic.  Eyes:     Conjunctiva/sclera: Conjunctivae normal.  Neck:     Thyroid: No thyromegaly.  Cardiovascular:     Rate and Rhythm: Normal rate and regular rhythm.     Heart sounds: Normal heart sounds. No murmur heard. Pulmonary:     Effort: Pulmonary effort is normal. No respiratory distress.     Breath sounds: Normal breath sounds.  Abdominal:     General: Bowel sounds are normal. There is no distension.     Palpations: Abdomen is soft. There is no mass.     Tenderness: There is no abdominal tenderness.  Musculoskeletal:     Cervical back: Neck supple.  Lymphadenopathy:     Cervical: No cervical adenopathy.  Skin:    General: Skin is warm and dry.  Neurological:     Mental Status:  She is alert and oriented to person, place, and time.  Psychiatric:        Behavior: Behavior normal.    BP 126/84 (BP Location: Left Arm, Patient Position: Sitting)    Pulse 73    Temp 98.9 F (37.2 C)    Resp 16    Ht 4\' 11"  (1.499 m)    Wt 118 lb 9.6 oz (53.8 kg)    SpO2 95%    BMI 23.95 kg/m  Wt Readings from Last 3 Encounters:  02/28/21 118 lb 9.6 oz (53.8 kg)  01/10/21 118 lb (53.5 kg)  08/24/20 115 lb (52.2 kg)    Diabetic Foot Exam - Simple   No data filed    Lab Results  Component Value Date   WBC 10.1 08/24/2020   HGB 13.2 08/24/2020   HCT 39.4 08/24/2020   PLT 367.0 08/24/2020   GLUCOSE 92 08/24/2020   CHOL 195 08/24/2020   TRIG 117.0 08/24/2020   HDL 79.40 08/24/2020   LDLDIRECT 135.0 05/08/2016   LDLCALC 93 08/24/2020   ALT 15 08/24/2020   AST 16 08/24/2020   NA 138 08/24/2020   K 4.6 08/24/2020   CL 103 08/24/2020   CREATININE 0.75 08/24/2020   BUN 7 08/24/2020   CO2 28 08/24/2020   TSH 1.06 08/24/2020   HGBA1C 5.8 08/24/2020    Lab Results  Component Value Date   TSH 1.06 08/24/2020   Lab Results  Component Value Date   WBC 10.1 08/24/2020   HGB 13.2 08/24/2020   HCT 39.4 08/24/2020   MCV 92.4 08/24/2020   PLT 367.0 08/24/2020   Lab Results  Component Value Date   NA 138 08/24/2020   K 4.6 08/24/2020   CO2 28 08/24/2020   GLUCOSE  92 08/24/2020   BUN 7 08/24/2020   CREATININE 0.75 08/24/2020   BILITOT 0.5 08/24/2020   ALKPHOS 63 08/24/2020   AST 16 08/24/2020   ALT 15 08/24/2020   PROT 6.9 08/24/2020   ALBUMIN 4.4 08/24/2020   CALCIUM 10.0 08/24/2020   GFR 79.01 08/24/2020   Lab Results  Component Value Date   CHOL 195 08/24/2020   Lab Results  Component Value Date   HDL 79.40 08/24/2020   Lab Results  Component Value Date   LDLCALC 93 08/24/2020   Lab Results  Component Value Date   TRIG 117.0 08/24/2020   Lab Results  Component Value Date   CHOLHDL 2 08/24/2020   Lab Results  Component Value Date   HGBA1C 5.8  08/24/2020       Assessment & Plan:   Problem List Items Addressed This Visit     HTN (hypertension) - Primary    Improved on recheck, Well controlled, no changes to meds. Encouraged heart healthy diet such as the DASH diet and exercise as tolerated. Numbers at home generally, 140-145/ 65-70. No changes today      Relevant Orders   CBC   Comprehensive metabolic panel   TSH   Hyperlipidemia    Encourage heart healthy diet such as MIND or DASH diet, increase exercise, avoid trans fats, simple carbohydrates and processed foods, consider a krill or fish or flaxseed oil cap daily.       Relevant Orders   Lipid panel   Osteoporosis    Encouraged to get adequate exercise, calcium and vitamin d intake      Hyperglycemia    hgba1c acceptable, minimize simple carbs. Increase exercise as tolerated.      Relevant Orders   Hemoglobin A1c   IBS (irritable bowel syndrome)    Encouraged to hydrate and add a fiber supplement       No orders of the defined types were placed in this encounter.   I,Robin [name],acting as a Education administrator for Penni Homans, MD.,have documented all relevant documentation on the behalf of Penni Homans, MD,as directed by  Penni Homans, MD while in the presence of Penni Homans, MD.   I, Mosie Lukes, MD. , personally preformed the services described in this documentation.  All medical record entries made by the scribe were at my direction and in my presence.  I have reviewed the chart and discharge instructions (if applicable) and agree that the record reflects my personal performance and is accurate and complete. 02/28/2021

## 2021-04-27 DIAGNOSIS — H903 Sensorineural hearing loss, bilateral: Secondary | ICD-10-CM | POA: Diagnosis not present

## 2021-04-29 ENCOUNTER — Other Ambulatory Visit (HOSPITAL_BASED_OUTPATIENT_CLINIC_OR_DEPARTMENT_OTHER): Payer: Self-pay

## 2021-04-29 MED ORDER — SHINGRIX 50 MCG/0.5ML IM SUSR
INTRAMUSCULAR | 1 refills | Status: DC
  Filled 2021-04-29: qty 1, 1d supply, fill #0
  Filled 2021-07-19: qty 1, 1d supply, fill #1

## 2021-05-21 ENCOUNTER — Other Ambulatory Visit: Payer: Self-pay | Admitting: Family Medicine

## 2021-05-31 DIAGNOSIS — H5213 Myopia, bilateral: Secondary | ICD-10-CM | POA: Diagnosis not present

## 2021-07-17 IMAGING — DX DG FOOT COMPLETE 3+V*R*
3 series · 3 of 3 positions shown · non-contrast
Comparison: None.

CLINICAL DATA: Injury several months ago with persistent pain,
initial encounter

EXAM:
RIGHT FOOT COMPLETE - 3+ VIEW

[foot ap]
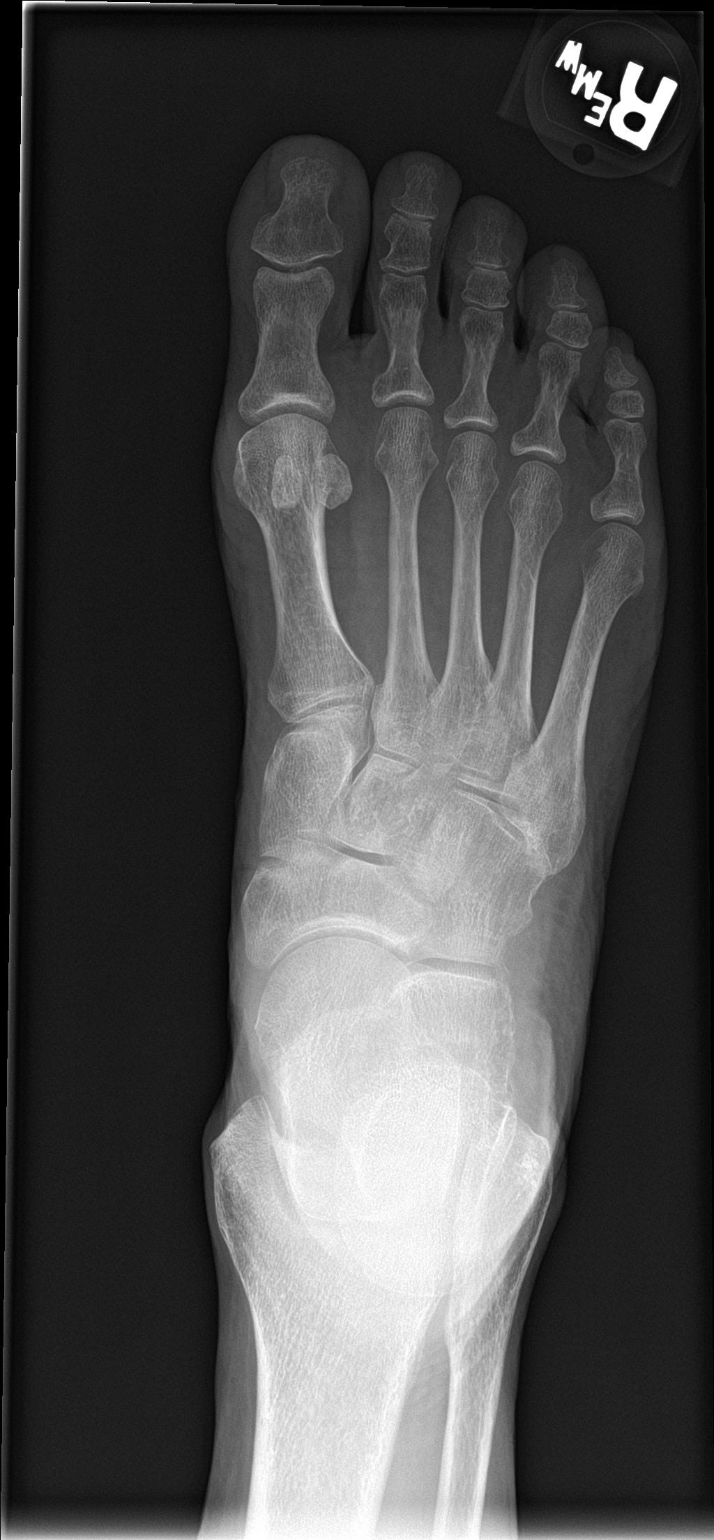

[foot obl]
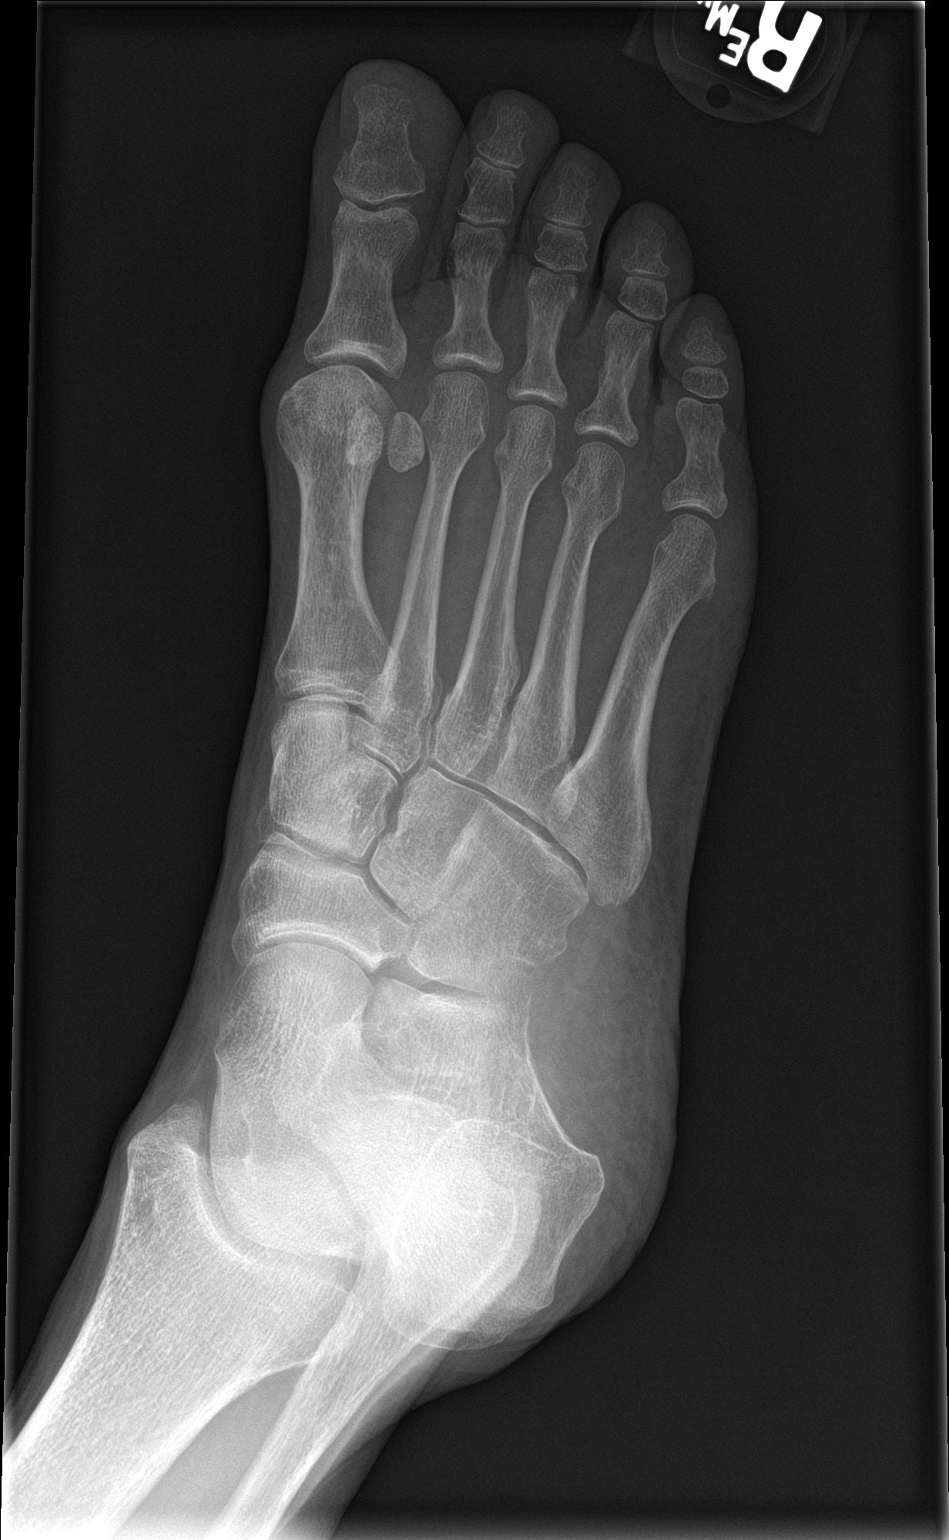

[foot lat]
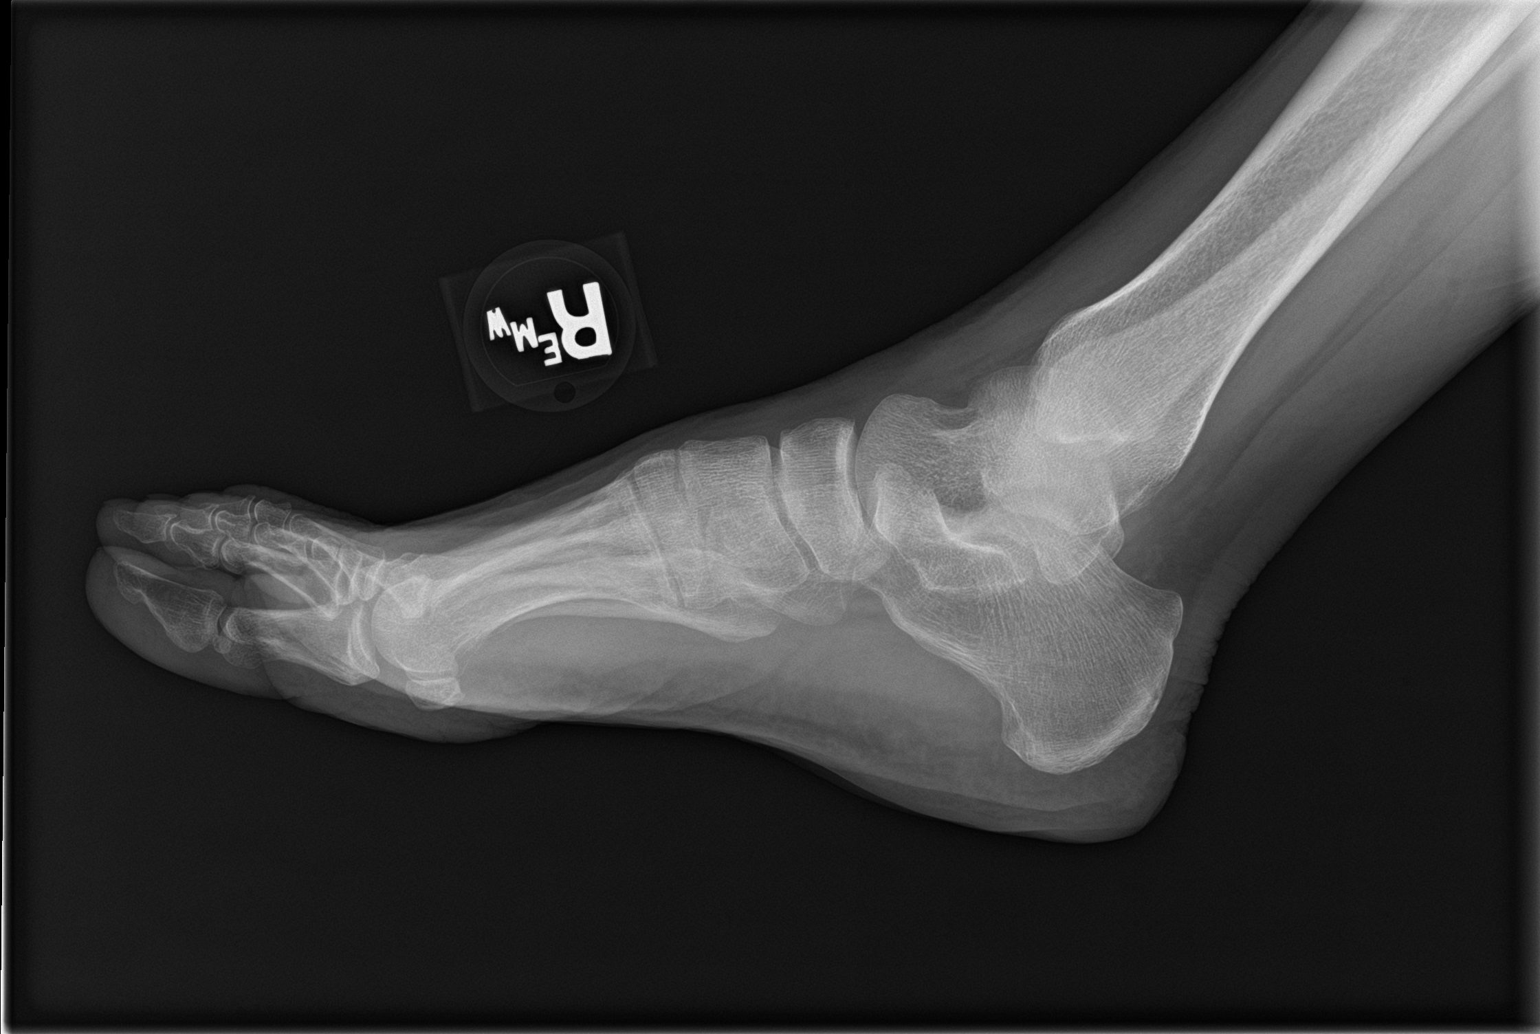

[3 of 3 positions shown; findings below may reference images not displayed]

FINDINGS: There is no evidence of fracture or dislocation. There is no
evidence of arthropathy or other focal bone abnormality. Soft
tissues are unremarkable.
IMPRESSION: No acute abnormality noted.

## 2021-07-17 IMAGING — DX DG TIBIA/FIBULA 2V*R*
4 series · 4 of 4 positions shown · non-contrast
Comparison: None.

CLINICAL DATA: Previous inversion injury with foot drop, initial
encounter

EXAM:
RIGHT TIBIA AND FIBULA - 2 VIEW

[tib/fib ap (1 of 2)]
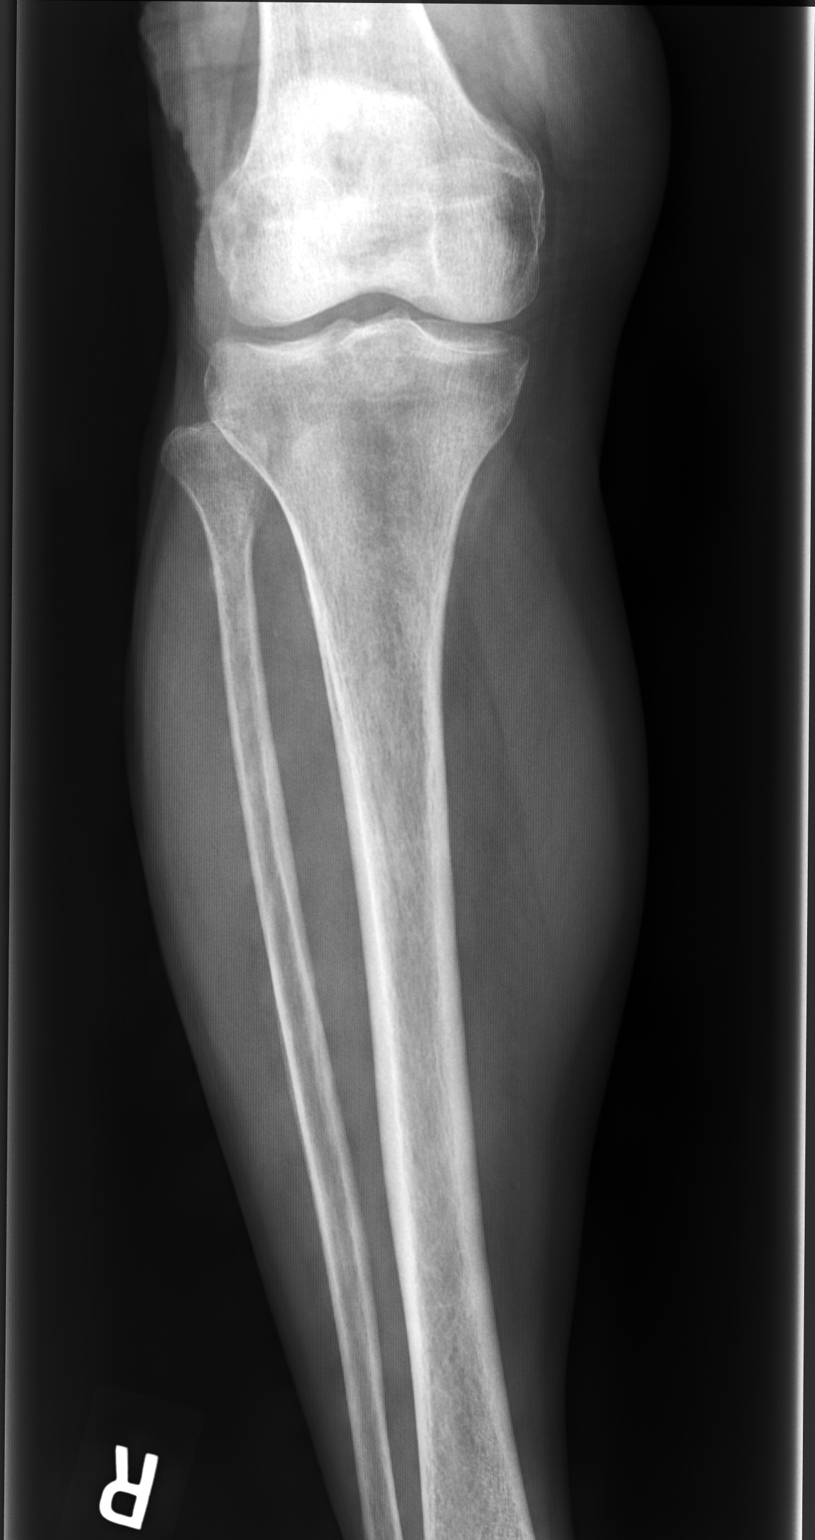

[tib/fib lat (1 of 2)]
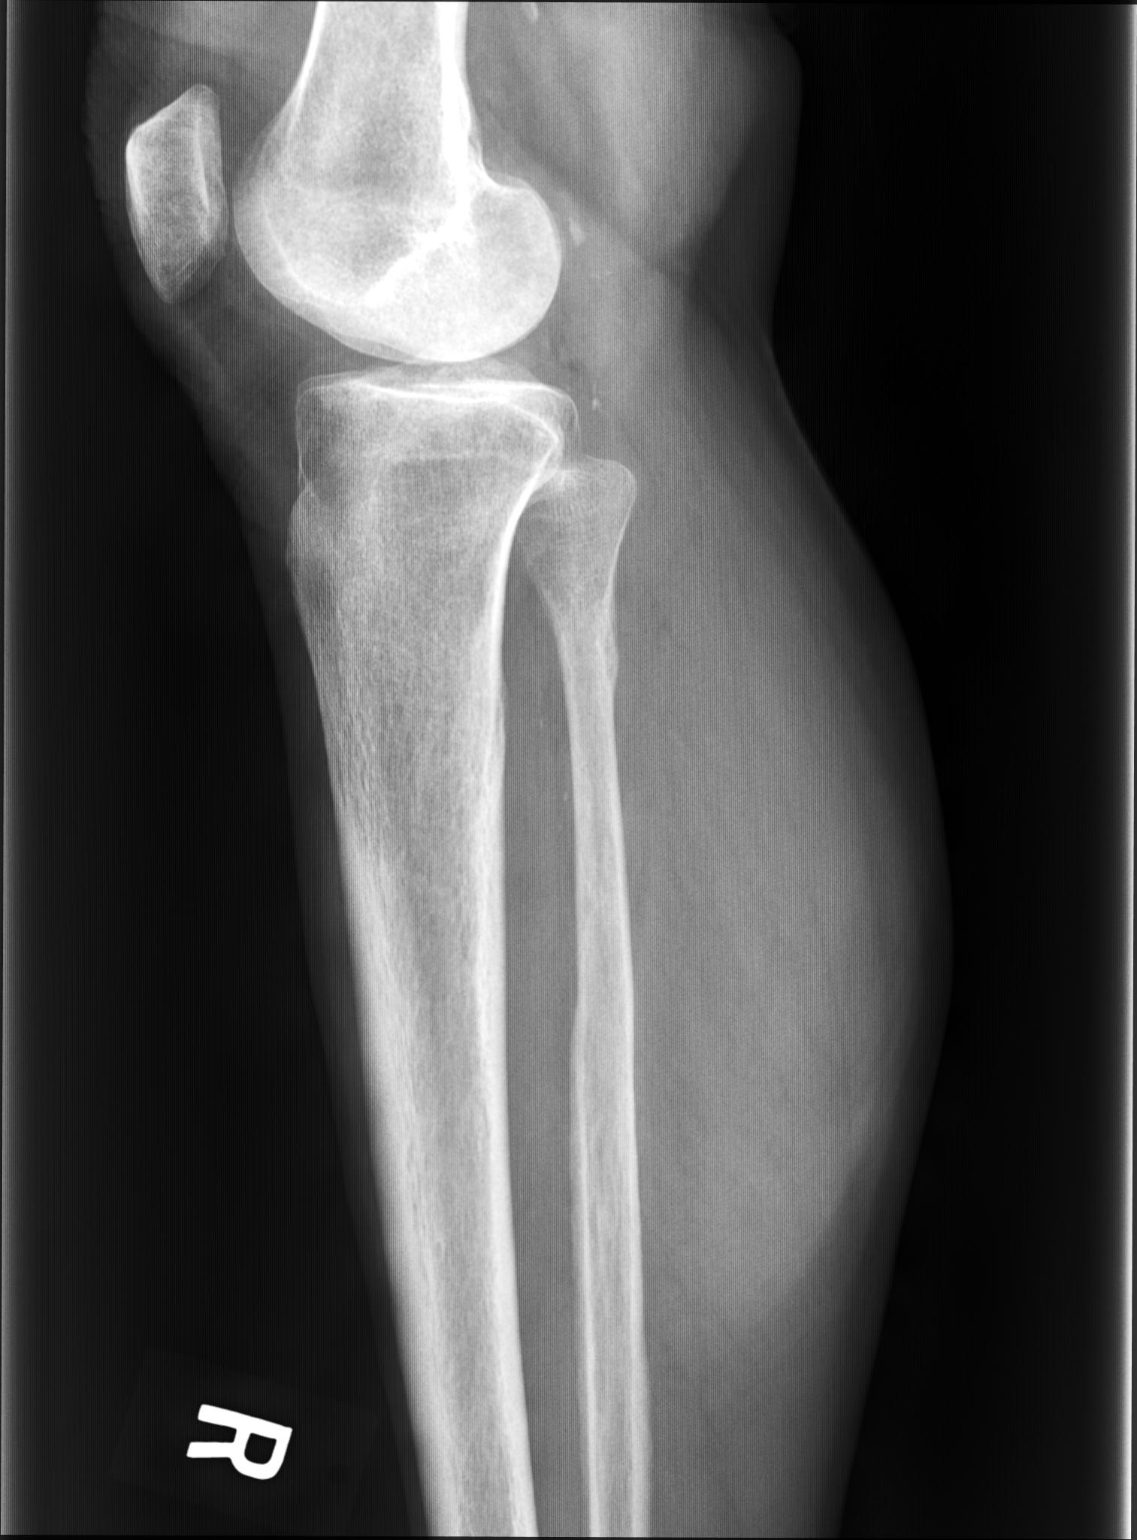

[tib/fib ap (2 of 2)]
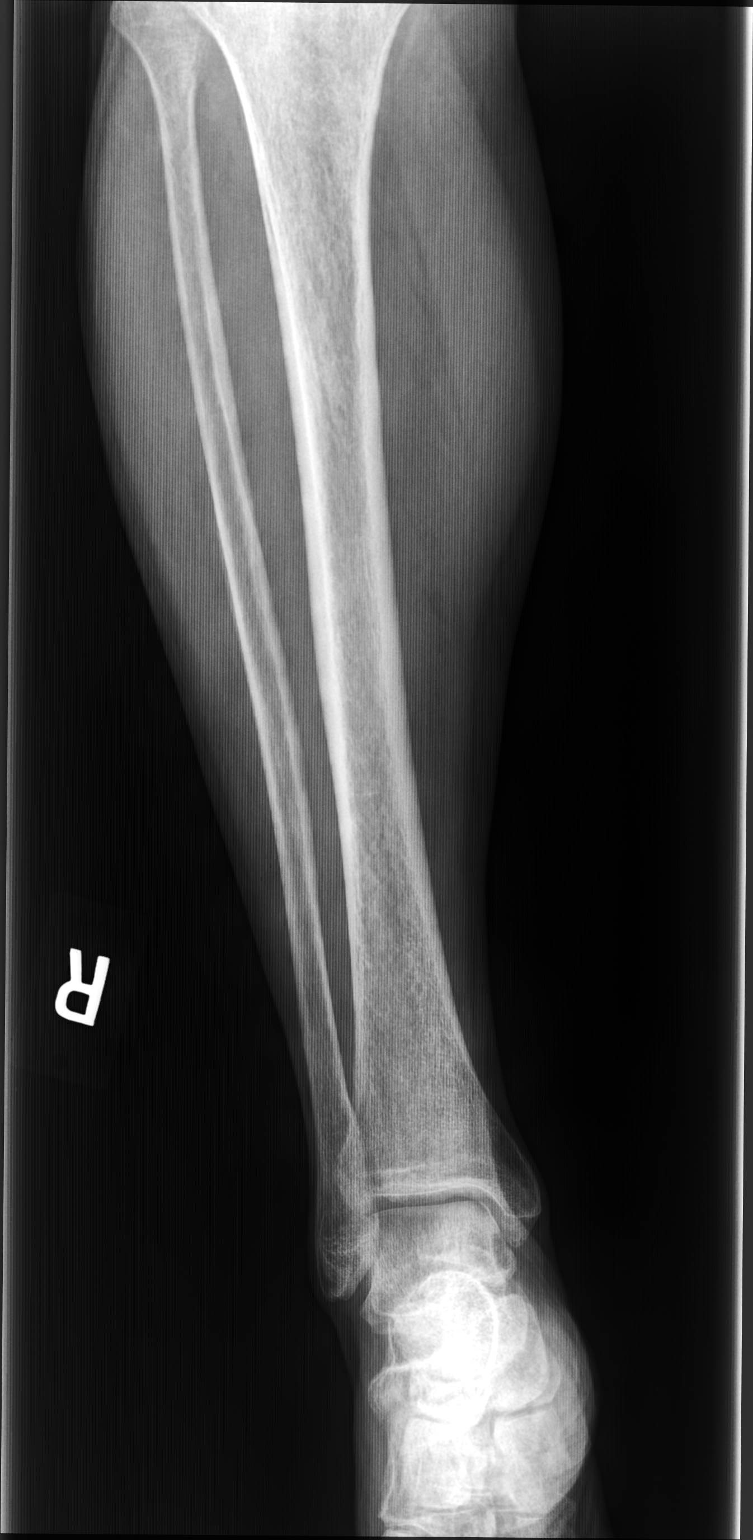

[tib/fib lat (2 of 2)]
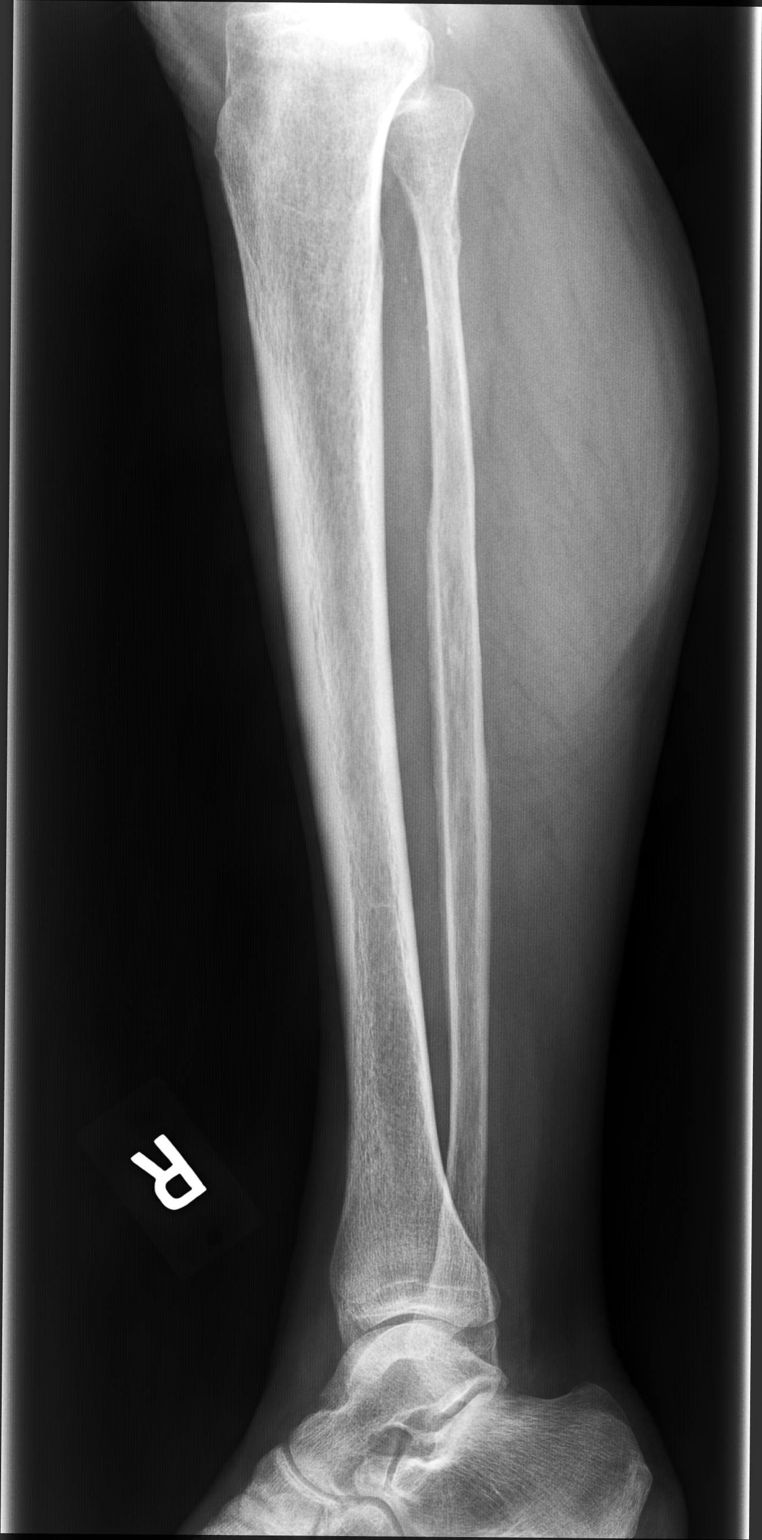

[4 of 4 positions shown; findings below may reference images not displayed]

FINDINGS: No acute fracture or dislocation is noted. No gross soft tissue
abnormality is seen.
IMPRESSION: No acute abnormality noted.

## 2021-07-18 ENCOUNTER — Ambulatory Visit (INDEPENDENT_AMBULATORY_CARE_PROVIDER_SITE_OTHER): Payer: Medicare Other

## 2021-07-18 DIAGNOSIS — Z0000 Encounter for general adult medical examination without abnormal findings: Secondary | ICD-10-CM

## 2021-07-18 NOTE — Progress Notes (Signed)
Subjective:   Robin [name] is a 76 y.o. female who presents for Medicare Annual (Subsequent) preventive examination.  I connected with  Anjulie Ekstrom on 07/18/21 by a audio enabled telemedicine application and verified that I am speaking with the correct person using two identifiers.  Patient Location: Home  Provider Location: Office/Clinic  I discussed the limitations of evaluation and management by telemedicine. The patient expressed understanding and agreed to proceed.   Review of Systems     Cardiac Risk Factors include: advanced age (>66mn, >>1women);dyslipidemia;hypertension     Objective:    There were no vitals filed for this visit. There is no height or weight on file to calculate BMI.     07/18/2021   10:23 AM 05/08/2016   10:25 AM 06/20/2014   10:15 PM  Advanced Directives  Does Patient Have a Medical Advance Directive? Yes Yes No  Type of AParamedicof ABonaparteOut of facility DNR (pink MOST or yellow form);Living [place]   Does patient want to make changes to medical advance directive?  No - Patient declined   Copy of [place] Chart? No - copy requested No - copy requested   Would patient like information on creating a medical advance directive?   Yes - Educational materials given    Current Medications (verified) Outpatient Encounter Medications as of 07/18/2021  Medication Sig   calcium-vitamin D (OSCAL WITH D) 500-200 MG-UNIT per tablet Take 1 tablet by mouth.   famotidine (PEPCID) 40 MG tablet Take 40 mg by mouth daily.   fluconazole (DIFLUCAN) 150 MG tablet Take 1 tablet (150 mg total) by mouth once a week. (Patient not taking: Reported on 01/10/2021)   influenza vaccine adjuvanted (FLUAD) 0.5 ML injection Inject into the muscle. (Patient not taking: Reported on 01/10/2021)   losartan (COZAAR) 50 MG tablet Take 1 tablet by mouth once daily   Multiple Vitamins-Minerals  (MULTIVITAMIN WITH MINERALS) tablet Take 1 tablet by mouth daily.   Probiotic Product (ALIGN EXTRA STRENGTH PO) Take by mouth.   rosuvastatin (CRESTOR) 20 MG tablet Take 1 tablet by mouth once daily   valACYclovir (VALTREX) 500 MG tablet Take 1 tablet (500 mg total) by mouth 2 (two) times daily as needed.   Zoster Vaccine Adjuvanted ([facility] injection Inject into the muscle.   [DISCONTINUED] ALPRAZolam (XANAX) 0.25 MG tablet Take 0.5-1 tablets (0.125-0.25 mg total) by mouth 2 (two) times daily as needed for anxiety. (Patient not taking: Reported on 01/10/2021)   [DISCONTINUED] fish oil-omega-3 fatty acids 1000 MG capsule Take 1,200 mg by mouth daily.  (Patient not taking: Reported on 01/10/2021)   No facility-administered encounter medications on file as of 07/18/2021.    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   Grief reaction 06/04/2014   Hyperlipidemia    Hypertension    Inverted nipple    Bilateral   Medicare annual wellness visit, subsequent 06/20/2014   Medicare annual wellness visit, subsequent 05/06/2015   Osteopenia    Preventative health care 06/20/2014   Medication: Review, verify sig & reconcile(including outside meds): Duplicates discarded: DM supply source:  Preferred Pharmacy and which med [facility] [facility]/mail order: [facility]Local pharmacy: [facility] [place]  Allergies verified:UTD  Immunization Status: Prompt   Rosacea 11/06/2016   Rotator cuff tear, right 11/26/2012   Past Surgical History:  Procedure Laterality Date   CATARACT EXTRACTION Right 12/2016   EYE SURGERY Left 07/08/13   OOPHORECTOMY Right    SHOULDER ARTHROSCOPY      Family History  Problem Relation Age of Onset   Cancer Father 81       colon, preCA?   Arthritis Father        rheumatoid arthritis   Heart disease Father        stents   Stroke Maternal Grandmother    Hypertension Maternal Grandmother    Heart disease Paternal Grandfather    COPD Mother        smoker   Hypertension Mother    Arthritis Sister    Urolithiasis Sister    Heart disease Son    Social History   Socioeconomic History   Marital status: Widowed    Spouse name: Not on file   Number of children: Not on file   Years of education: Not on file   Highest education level: Not on file  Occupational History   Not on file  Tobacco Use   Smoking status: Former    Packs/day: 0.50    Years: 29.00    Total pack years: 14.50    Types: Cigarettes    Start date: 01/03/1983   Smokeless tobacco: Never   Tobacco comments:    06-02-13  still smoking  Vaping Use   Vaping Use: Never used  Substance and Sexual Activity   Alcohol use: Yes    Alcohol/week: 21.0 standard drinks of alcohol    Types: 21 Glasses of wine per week   Drug use: No   Sexual activity: Never    Comment: no dietary restrictions, smokes, lives with husband   Other Topics Concern   Not on file  Social History Narrative   Not on file   Social Determinants of Health   Financial Resource Strain: Low Risk  (07/18/2021)   Overall Financial Resource Strain (CARDIA)    Difficulty of Paying Living Expenses: Not hard at all  Food Insecurity: No Food Insecurity (07/18/2021)   Hunger Vital Sign    Worried About Running Out of Food in the Last Year: Never true    [place] in the Last Year: Never true  Transportation Needs: No Transportation Needs (07/18/2021)   PRAPARE - Hydrologist (Medical): No    Lack of Transportation (Non-Medical): No  Physical Activity: Sufficiently Active (07/18/2021)   Exercise Vital Sign    Days of Exercise per Week: 7 days    Minutes of Exercise per  Session: 60 min  Stress: No Stress Concern Present (07/18/2021)   [place]    Feeling of Stress : Not at all  Social Connections: Moderately Isolated (07/18/2021)   Social Connection and Isolation Panel [NHANES]    Frequency of Communication with Friends and Family: More than three times a week    Frequency of Social Gatherings with Friends and Family: More than three times a week    Attends Religious Services: More than 4 times per year    Active Member of [employer] or Organizations: No    Attends Archivist Meetings: Never    Marital Status: Widowed    Tobacco Counseling Counseling  given: Not Answered Tobacco comments: 06-02-13  still smoking   Clinical Intake:  Pre-visit preparation completed: Yes  Pain : No/denies pain     BMI - recorded: 23.95 Nutritional Status: BMI of 19-24  Normal Nutritional Risks: None Diabetes: No  How often do you need to have someone help you when you read instructions, pamphlets, or other written materials from your doctor or pharmacy?: 1 - Never  Diabetic?No  Interpreter Needed?: No  Information entered by :: [place] of Daily Living    07/18/2021   10:28 AM 07/11/2021    8:44 AM  In your present state of health, do you have any difficulty performing the following activities:  Hearing? 1 0  Vision? 0 0  Difficulty concentrating or making decisions? 1 0  Walking or climbing stairs? 0 0  Dressing or bathing? 0 0  Doing errands, shopping? 0 0  Preparing Food and eating ? N N  Using the Toilet? N N  In the past six months, have you accidently leaked urine? Y Y  Do you have problems with loss of bowel control? Y Y  Managing your Medications? N N  Managing your Finances? N N  Housekeeping or managing your Housekeeping? N N    Patient Care Team: Mosie Lukes, MD as PCP - General (Family Medicine) Mercy Moore, OD as Consulting Physician  (Optometry) Laurence Spates, MD (Inactive) as Consulting Physician (Gastroenterology) Prudencio Pair as Physician Assistant (Emergency Medicine)  Indicate any recent Medical Services you may have received from other than [facility] providers in the past year (date may be approximate).     Assessment:   This is a routine wellness examination for Evann.  Hearing/Vision screen No results found.  Dietary issues and exercise activities discussed: Current Exercise Habits: Home exercise routine, Type of exercise: walking;stretching, Time (Minutes): 60, Frequency (Times/Week): 7, Weekly Exercise (Minutes/Week): 420, Intensity: Mild   Goals Addressed   None    Depression Screen    07/18/2021   10:26 AM 02/28/2021   10:52 AM 08/24/2020   10:38 AM 02/12/2020   10:50 AM 05/08/2016   10:32 AM 06/04/2014   10:04 AM 04/14/2013    9:25 AM  PHQ 2/9 Scores  PHQ - 2 Score 0 0 0 0 0 1 0    Fall Risk    07/18/2021   10:25 AM 07/11/2021    8:44 AM 02/28/2021   10:52 AM 08/24/2020   10:38 AM 02/12/2020   10:50 AM  Fall Risk   Falls in the past year? 0 0 0  0  Number falls in past yr: 0 0 0 0 0  Injury with Fall? 0 0 0 0 0  Risk for fall due to : No Fall Risks  No Fall Risks No Fall Risks   Follow up Falls evaluation completed  Falls evaluation completed Falls evaluation completed     FALL RISK PREVENTION PERTAINING TO THE HOME:  Any stairs in or around the home? Yes  If so, are there any without handrails? No  Home free of loose throw rugs in walkways, pet beds, electrical cords, etc? Yes  Adequate lighting in your home to reduce risk of falls? Yes   ASSISTIVE DEVICES UTILIZED TO PREVENT FALLS:  Life alert? No  Use of a cane, walker or w/c? No  Grab bars in the bathroom? No  Shower chair or bench in shower? No  Elevated toilet seat or a handicapped toilet? No   TIMED  UP AND GO:  Was the test performed? No .    Cognitive Function:        07/18/2021   10:34 AM  6CIT Screen  What  Year? 0 points  What month? 0 points  What time? 0 points  Count back from 20 0 points  Months in reverse 0 points  Repeat phrase 0 points  Total Score 0 points    Immunizations Immunization History  Administered Date(s) Administered   Fluad Quad(high Dose 65+) 09/26/2018, 11/17/2020   Influenza, High Dose Seasonal PF 11/06/2016, 11/08/2017, 10/31/2019   Influenza,inj,Quad PF,6+ Mos 11/26/2012, 12/01/2013, 11/05/2014, 12/09/2014   Influenza-Unspecified 10/21/2015   Moderna Covid-19 Vaccine Bivalent Booster 8yr & up 12/01/2020   Moderna Sars-Covid-2 Vaccination 03/14/2019, 04/11/2019, 12/09/2019   Pneumococcal Conjugate-13 11/06/2016   Pneumococcal Polysaccharide-23 01/06/2013   Zoster Recombinat (Shingrix) 04/29/2021    TDAP status: Due, Education has been provided regarding the importance of this vaccine. Advised may receive this vaccine at local pharmacy or Health Dept. Aware to provide a copy of the vaccination record if obtained from local pharmacy or Health Dept. Verbalized acceptance and understanding.  Flu Vaccine status: Up to date  Pneumococcal vaccine status: Up to date  Covid-19 vaccine status: Completed vaccines  Qualifies for Shingles Vaccine? Yes   Zostavax completed No   Shingrix Completed?: No.    Education has been provided regarding the importance of this vaccine. Patient has been advised to call insurance company to determine out of pocket expense if they have not yet received this vaccine. Advised may also receive vaccine at local pharmacy or Health Dept. Verbalized acceptance and understanding.  Screening Tests Health Maintenance  Topic Date Due   Hepatitis C Screening  Never done   COVID-19 Vaccine (5 - Moderna series) 03/31/2021   TETANUS/TDAP  06/04/2021   Zoster Vaccines- Shingrix (2 of 2) 06/24/2021   INFLUENZA VACCINE  08/30/2021   Fecal DNA (Cologuard)  03/02/2022   MAMMOGRAM  11/02/2022   Pneumonia Vaccine 74 Years old  Completed   DEXA  SCAN  Completed   HPV VACCINES  Aged Out   COLONOSCOPY (Pts [phone] coverage will need to be confirmed)  Discontinued    Health Maintenance  Health Maintenance Due  Topic Date Due   Hepatitis C Screening  Never done   COVID-19 Vaccine (5 - Moderna series) 03/31/2021   TETANUS/TDAP  06/04/2021   Zoster Vaccines- Shingrix (2 of 2) 06/24/2021    Colorectal cancer screening: Type of screening: Cologuard. Completed 03/03/19. Repeat every 03/02/22 years  Mammogram status: Completed 11/01/20. Repeat every year  Bone Density status: Completed 09/27/20. Results reflect: Bone density results: OSTEOPOROSIS. Repeat every 2 years.  Lung Cancer Screening: (Low Dose CT Chest recommended if Age 74-80ears, 30 pack-year currently smoking OR have quit w/in 15years.) does not qualify.   Lung Cancer Screening Referral: N/A  Additional Screening:  Hepatitis C Screening: does qualify; Completed not yet  Vision Screening: Recommended annual ophthalmology exams for early detection of glaucoma and other disorders of the eye. Is the patient up to date with their annual eye exam?  Yes  Who is the provider or what is the name of the office in which the patient attends annual eye exams? [facility] pt is not established with a provider, would they like to be referred to a provider to establish care? Yes .   Dental Screening: Recommended annual dental exams for proper oral hygiene  Community Resource Referral / Chronic Care Management: CRR required this  visit?  No   CCM required this visit?  No      Plan:     I have personally reviewed and noted the following in the patient's chart:   Medical and social history Use of alcohol, tobacco or illicit drugs  Current medications and supplements including opioid prescriptions.  Functional ability and status Nutritional status Physical activity Advanced directives List of other physicians Hospitalizations, surgeries, and ER visits in previous 12  months Vitals Screenings to include cognitive, depression, and falls Referrals and appointments  In addition, I have reviewed and discussed with patient certain preventive protocols, quality metrics, and best practice recommendations. A written personalized care plan for preventive services as well as general preventive health recommendations were provided to patient.   Due to this being a telephonic visit, the after visit summary with patients personalized plan was offered to patient via mail or my-chart.  Patient would like to access on my-chart.    Duard Brady Vickie Ponds, Wann   07/18/2021   Nurse Notes: none

## 2021-07-18 NOTE — Patient Instructions (Signed)
Robin [name] , Thank you for taking time to come for your Medicare Wellness Visit. I appreciate your ongoing commitment to your health goals. Please review the following plan we discussed and let me know if I can assist you in the future.   Screening recommendations/referrals: Colonoscopy: 03/03/19 due 03/02/22 Mammogram: 11/01/20 due 10/31/21 Bone Density: 09/27/20 due 09/28/22 Recommended yearly ophthalmology/optometry visit for glaucoma screening and checkup Recommended yearly dental visit for hygiene and checkup  Vaccinations: Influenza vaccine: up to date Pneumococcal vaccine: up to date Tdap vaccine: Due-May obtain vaccine at your local pharmacy.  Shingles vaccine: up to date   Covid-19:completed  Advanced directives: yes, not on file  Conditions/risks identified: see problem list   Next appointment: Follow up in one year for your annual wellness visit    Preventive Care 5 Years and Older, Female Preventive care refers to lifestyle choices and visits with your health care provider that can promote health and wellness. What does preventive care include? A yearly physical exam. This is also called an annual well check. Dental exams once or twice a year. Routine eye exams. Ask your health care provider how often you should have your eyes checked. Personal lifestyle choices, including: Daily care of your teeth and gums. Regular physical activity. Eating a healthy diet. Avoiding tobacco and drug use. Limiting alcohol use. Practicing safe sex. Taking low-dose aspirin every day. Taking vitamin and mineral supplements as recommended by your health care provider. What happens during an annual well check? The services and screenings done by your health care provider during your annual well check will depend on your age, overall health, lifestyle risk factors, and family history of disease. Counseling  Your health care provider may ask you questions about your: Alcohol use. Tobacco  use. Drug use. Emotional well-being. Home and relationship well-being. Sexual activity. Eating habits. History of falls. Memory and ability to understand (cognition). Work and work Statistician. Reproductive health. Screening  You may have the following tests or measurements: Height, weight, and BMI. Blood pressure. Lipid and cholesterol levels. These may be checked every 5 years, or more frequently if you are over 61 years old. Skin check. Lung cancer screening. You may have this screening every year starting at age 68 if you have a 30-pack-year history of smoking and currently smoke or have quit within the past 15 years. Fecal occult blood test (FOBT) of the stool. You may have this test every year starting at age 58. Flexible sigmoidoscopy or colonoscopy. You may have a sigmoidoscopy every 5 years or a colonoscopy every 10 years starting at age 80. Hepatitis C blood test. Hepatitis B blood test. Sexually transmitted disease (STD) testing. Diabetes screening. This is done by checking your blood sugar (glucose) after you have not eaten for a while (fasting). You may have this done every 1-3 years. Bone density scan. This is done to screen for osteoporosis. You may have this done starting at age 30. Mammogram. This may be done every 1-2 years. Talk to your health care provider about how often you should have regular mammograms. Talk with your health care provider about your test results, treatment options, and if necessary, the need for more tests. Vaccines  Your health care provider may recommend certain vaccines, such as: Influenza vaccine. This is recommended every year. Tetanus, diphtheria, and acellular pertussis (Tdap, Td) vaccine. You may need a Td booster every 10 years. Zoster vaccine. You may need this after age 11. Pneumococcal 13-valent conjugate (PCV13) vaccine. One dose is recommended after  age 45. Pneumococcal polysaccharide (PPSV23) vaccine. One dose is recommended  after age 60. Talk to your health care provider about which screenings and vaccines you need and how often you need them. This information is not intended to replace advice given to you by your health care provider. Make sure you discuss any questions you have with your health care provider. Document Released: 02/12/2015 Document Revised: 10/06/2015 Document Reviewed: 11/17/2014 Elsevier Interactive Patient Education  2017 [place] Prevention in the Home Falls can cause injuries. They can happen to people of all ages. There are many things you can do to make your home safe and to help prevent falls. What can I do on the outside of my home? Regularly fix the edges of walkways and driveways and fix any cracks. Remove anything that might make you trip as you walk through a door, such as a raised step or threshold. Trim any bushes or trees on the path to your home. Use bright outdoor lighting. Clear any walking paths of anything that might make someone trip, such as rocks or tools. Regularly check to see if handrails are loose or broken. Make sure that both sides of any steps have handrails. Any raised decks and porches should have guardrails on the edges. Have any leaves, snow, or ice cleared regularly. Use sand or salt on walking paths during winter. Clean up any spills in your garage right away. This includes oil or grease spills. What can I do in the bathroom? Use night lights. Install grab bars by the toilet and in the tub and shower. Do not use towel bars as grab bars. Use non-skid mats or decals in the tub or shower. If you need to sit down in the shower, use a plastic, non-slip stool. Keep the floor dry. Clean up any water that spills on the floor as soon as it happens. Remove soap buildup in the tub or shower regularly. Attach bath mats securely with double-sided non-slip rug tape. Do not have throw rugs and other things on the floor that can make you trip. What can I do  in the bedroom? Use night lights. Make sure that you have a light by your bed that is easy to reach. Do not use any sheets or blankets that are too big for your bed. They should not hang down onto the floor. Have a firm chair that has side arms. You can use this for support while you get dressed. Do not have throw rugs and other things on the floor that can make you trip. What can I do in the kitchen? Clean up any spills right away. Avoid walking on wet floors. Keep items that you use a lot in easy-to-reach places. If you need to reach something above you, use a strong step stool that has a grab bar. Keep electrical cords out of the way. Do not use floor polish or wax that makes floors slippery. If you must use wax, use non-skid floor wax. Do not have throw rugs and other things on the floor that can make you trip. What can I do with my stairs? Do not leave any items on the stairs. Make sure that there are handrails on both sides of the stairs and use them. Fix handrails that are broken or loose. Make sure that handrails are as long as the stairways. Check any carpeting to make sure that it is firmly attached to the stairs. Fix any carpet that is loose or worn. Avoid having throw rugs  at the top or bottom of the stairs. If you do have throw rugs, attach them to the floor with carpet tape. Make sure that you have a light switch at the top of the stairs and the bottom of the stairs. If you do not have them, ask someone to add them for you. What else can I do to help prevent falls? Wear shoes that: Do not have high heels. Have rubber bottoms. Are comfortable and fit you well. Are closed at the toe. Do not wear sandals. If you use a stepladder: Make sure that it is fully opened. Do not climb a closed stepladder. Make sure that both sides of the stepladder are locked into place. Ask someone to hold it for you, if possible. Clearly mark and make sure that you can see: Any grab bars or  handrails. First and last steps. Where the edge of each step is. Use tools that help you move around (mobility aids) if they are needed. These include: Canes. Walkers. Scooters. Crutches. Turn on the lights when you go into a dark area. Replace any light bulbs as soon as they burn out. Set up your furniture so you have a clear path. Avoid moving your furniture around. If any of your floors are uneven, fix them. If there are any pets around you, be aware of where they are. Review your medicines with your doctor. Some medicines can make you feel dizzy. This can increase your chance of falling. Ask your doctor what other things that you can do to help prevent falls. This information is not intended to replace advice given to you by your health care provider. Make sure you discuss any questions you have with your health care provider. Document Released: 11/12/2008 Document Revised: 06/24/2015 Document Reviewed: 02/20/2014 Elsevier Interactive Patient Education  2017 [employer].

## 2021-07-19 ENCOUNTER — Other Ambulatory Visit (HOSPITAL_BASED_OUTPATIENT_CLINIC_OR_DEPARTMENT_OTHER): Payer: Self-pay

## 2021-09-15 ENCOUNTER — Other Ambulatory Visit (INDEPENDENT_AMBULATORY_CARE_PROVIDER_SITE_OTHER): Payer: Medicare Other

## 2021-09-15 DIAGNOSIS — E785 Hyperlipidemia, unspecified: Secondary | ICD-10-CM

## 2021-09-15 DIAGNOSIS — I10 Essential (primary) hypertension: Secondary | ICD-10-CM | POA: Diagnosis not present

## 2021-09-15 DIAGNOSIS — R739 Hyperglycemia, unspecified: Secondary | ICD-10-CM

## 2021-09-15 LAB — CBC
HCT: 39.8 % (ref 36.0–46.0)
Hemoglobin: 13.2 g/dL (ref 12.0–15.0)
MCHC: 33.2 g/dL (ref 30.0–36.0)
MCV: 93.3 fl (ref 78.0–100.0)
Platelets: 390 10*3/uL (ref 150.0–400.0)
RBC: 4.27 Mil/uL (ref 3.87–5.11)
RDW: 13.6 % (ref 11.5–15.5)
WBC: 9.2 10*3/uL (ref 4.0–10.5)

## 2021-09-15 LAB — COMPREHENSIVE METABOLIC PANEL
ALT: 16 U/L (ref 0–35)
AST: 17 U/L (ref 0–37)
Albumin: 4.4 g/dL (ref 3.5–5.2)
Alkaline Phosphatase: 60 U/L (ref 39–117)
BUN: 14 mg/dL (ref 6–23)
CO2: 31 mEq/L (ref 19–32)
Calcium: 10.2 mg/dL (ref 8.4–10.5)
Chloride: 102 mEq/L (ref 96–112)
Creatinine, Ser: 0.83 mg/dL (ref 0.40–1.20)
GFR: 69.44 mL/min (ref >60.00)
Glucose, Bld: 77 mg/dL (ref 70–99)
Potassium: 4.9 mEq/L (ref 3.5–5.1)
Sodium: 138 mEq/L (ref 135–145)
Total Bilirubin: 0.6 mg/dL (ref 0.2–1.2)
Total Protein: 6.8 g/dL (ref 6.0–8.3)

## 2021-09-15 LAB — LIPID PANEL
Cholesterol: 198 mg/dL (ref 0–200)
HDL: 81.7 mg/dL (ref >39.00)
LDL Cholesterol: 91 mg/dL (ref 0–99)
NonHDL: 115.88
Total CHOL/HDL Ratio: 2
Triglycerides: 123 mg/dL (ref 0.0–149.0)
VLDL: 24.6 mg/dL (ref 0.0–40.0)

## 2021-09-15 LAB — HEMOGLOBIN A1C: Hgb A1c MFr Bld: 6.1 % (ref 4.6–6.5)

## 2021-09-15 LAB — TSH: TSH: 1.56 u[IU]/mL (ref 0.35–5.50)

## 2021-09-16 ENCOUNTER — Encounter: Payer: Self-pay | Admitting: Family Medicine

## 2021-09-22 ENCOUNTER — Other Ambulatory Visit (HOSPITAL_BASED_OUTPATIENT_CLINIC_OR_DEPARTMENT_OTHER): Payer: Self-pay | Admitting: Family Medicine

## 2021-09-22 ENCOUNTER — Ambulatory Visit (INDEPENDENT_AMBULATORY_CARE_PROVIDER_SITE_OTHER): Payer: Medicare Other | Admitting: Family Medicine

## 2021-09-22 VITALS — BP 122/72 | HR 64 | Temp 97.6°F | Resp 16 | Ht 59.0 in | Wt 122.6 lb

## 2021-09-22 DIAGNOSIS — Z0000 Encounter for general adult medical examination without abnormal findings: Secondary | ICD-10-CM | POA: Diagnosis not present

## 2021-09-22 DIAGNOSIS — Z1231 Encounter for screening mammogram for malignant neoplasm of breast: Secondary | ICD-10-CM

## 2021-09-22 DIAGNOSIS — E785 Hyperlipidemia, unspecified: Secondary | ICD-10-CM

## 2021-09-22 DIAGNOSIS — Z1211 Encounter for screening for malignant neoplasm of colon: Secondary | ICD-10-CM | POA: Diagnosis not present

## 2021-09-22 DIAGNOSIS — I10 Essential (primary) hypertension: Secondary | ICD-10-CM

## 2021-09-22 DIAGNOSIS — M810 Age-related osteoporosis without current pathological fracture: Secondary | ICD-10-CM

## 2021-09-22 DIAGNOSIS — R739 Hyperglycemia, unspecified: Secondary | ICD-10-CM

## 2021-09-22 NOTE — Assessment & Plan Note (Signed)
Well controlled, no changes to meds. Encouraged heart healthy diet such as the DASH diet and exercise as tolerated.  °

## 2021-09-22 NOTE — Assessment & Plan Note (Signed)
Encourage heart healthy diet such as MIND or DASH diet, increase exercise, avoid trans fats, simple carbohydrates and processed foods, consider a krill or fish or flaxseed oil cap daily.  °

## 2021-09-22 NOTE — Progress Notes (Signed)
Subjective:   By signing my name below, I, Kellie Simmering, attest that this documentation has been prepared under the direction and in the presence of Mosie Lukes, MD 09/22/2021.     Patient ID: Robin [name], female    DOB: 03-Apr-1945, 77 y.o.   MRN: [id number]  Chief Complaint  Patient presents with   Annual Exam    Here for Annual Exam    HPI Patient is in today for a comprehensive physical exam.  She denies having any fever, chills, ear pain, headaches, muscle pain, joint pain, new moles, rash, itching, congestion, sinus pain, sore throat, chest pain, palpitations, wheezing, nausea, vomitting, abdominal pain, diarrhea, constipation, blood in stool, dysuria, urgency, frequency and hematuria.  Social history: She drinks 3 glasses of wine daily.   Immunizations: She is up to date on her Pneumonia and Shingles immunizations. She has been informed about receiving COVID-19, Flu and RSV vaccinations.   Diet: She is currently taking Metamucil gummies. She reports that her diet is not very well managed.   Exercise: She reports that she is going to the gym 3 times a week.   Dermatology: She is inquiring about seeing a dermatologist due to possible actinic keratosis on her left hand.   Weight: She has gained weight since her last visit.  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   Grief reaction 06/04/2014   Hyperlipidemia    Hypertension    Inverted nipple    Bilateral   Medicare annual wellness visit, subsequent 06/20/2014   Medicare annual wellness visit, subsequent 05/06/2015   Osteopenia    Preventative health care 06/20/2014   Medication: Review, verify sig & reconcile(including outside meds): Duplicates discarded: DM  supply source:  Preferred Pharmacy and which med where:[facility] [place], [place] 90 day supply/mail order: 90 Local pharmacy: [place], [place]  Allergies verified:UTD  Immunization Status: Prompt   Rosacea 11/06/2016   Rotator cuff tear, right 11/26/2012   Past Surgical History:  Procedure Laterality Date   CATARACT EXTRACTION Right 12/2016   EYE SURGERY Left 07/08/13   OOPHORECTOMY Right    SHOULDER ARTHROSCOPY     Family History  Problem Relation Age of Onset   Cancer Father 42       colon, preCA?   Arthritis Father        rheumatoid arthritis   Heart disease Father        stents   Stroke Maternal Grandmother    Hypertension Maternal Grandmother    Heart disease Paternal Grandfather    COPD Mother        smoker   Hypertension Mother    Arthritis Sister    Urolithiasis Sister    Heart disease Son    Social History   Socioeconomic History   Marital status: Widowed    Spouse name: Not on file   Number of children: Not on file   Years of education: Not on file   Highest education level: Not on file  Occupational History   Not on file  Tobacco Use   Smoking  status: Former    Packs/day: 0.50    Years: 29.00    Total pack years: 14.50    Types: Cigarettes    Start date: 01/03/1983   Smokeless tobacco: Never   Tobacco comments:    06-02-13  still smoking  Vaping Use   Vaping Use: Never used  Substance and Sexual Activity   Alcohol use: Yes    Alcohol/week: 21.0 standard drinks of alcohol    Types: 21 Glasses of wine per week   Drug use: No   Sexual activity: Never    Comment: no dietary restrictions, smokes, lives with husband   Other Topics Concern   Not on file  Social History Narrative   Not on file   Social Determinants of Health   Financial Resource Strain: Low Risk  (07/18/2021)   Overall Financial Resource Strain (CARDIA)    Difficulty of Paying  Living Expenses: Not hard at all  Food Insecurity: No Food Insecurity (07/18/2021)   Hunger Vital Sign    Worried About Running Out of Food in the Last Year: Never true    [place] in the Last Year: Never true  Transportation Needs: No Transportation Needs (07/18/2021)   PRAPARE - Hydrologist (Medical): No    Lack of Transportation (Non-Medical): No  Physical Activity: Sufficiently Active (07/18/2021)   Exercise Vital Sign    Days of Exercise per Week: 7 days    Minutes of Exercise per Session: 60 min  Stress: No Stress Concern Present (07/18/2021)   [place]    Feeling of Stress : Not at all  Social Connections: Moderately Isolated (07/18/2021)   Social Connection and Isolation Panel [NHANES]    Frequency of Communication with Friends and Family: More than three times a week    Frequency of Social Gatherings with Friends and Family: More than three times a week    Attends Religious Services: More than 4 times per year    Active Member of [employer] or Organizations: No    Attends Archivist Meetings: Never    Marital Status: Widowed  Intimate Partner Violence: Not At Risk (07/18/2021)   Humiliation, Afraid, Rape, and Kick questionnaire    Fear of Current or Ex-Partner: No    Emotionally Abused: No    Physically Abused: No    Sexually Abused: No   Outpatient Medications Prior to Visit  Medication Sig Dispense Refill   calcium-vitamin D (OSCAL WITH D) 500-200 MG-UNIT per tablet Take 1 tablet by mouth.     famotidine (PEPCID) 40 MG tablet Take 40 mg by mouth daily.     influenza vaccine adjuvanted (FLUAD) 0.5 ML injection Inject into the muscle. 0.5 mL 0   losartan (COZAAR) 50 MG tablet Take 1 tablet by mouth once daily 90 tablet 1   Multiple Vitamins-Minerals (MULTIVITAMIN WITH MINERALS) tablet Take 1 tablet by mouth daily.     Probiotic Product (ALIGN EXTRA STRENGTH PO) Take by  mouth.     rosuvastatin (CRESTOR) 20 MG tablet Take 1 tablet by mouth once daily 90 tablet 1   valACYclovir (VALTREX) 500 MG tablet Take 1 tablet (500 mg total) by mouth 2 (two) times daily as needed. 60 tablet 2   Zoster Vaccine Adjuvanted [facility]) injection Inject into the muscle. 1 each 1   fluconazole (DIFLUCAN) 150 MG tablet Take 1 tablet (150 mg total) by mouth once a week. (Patient not taking: Reported on 01/10/2021)  2 tablet 1   No facility-administered medications prior to visit.   Allergies  Allergen Reactions   Pravastatin Anaphylaxis    myalgias   Monistat [Miconazole]     ? Caused hives   Codeine Other (See Comments)   Review of Systems  Constitutional:  Negative for chills and fever.  HENT:  Negative for congestion, ear pain, sinus pain and sore throat.   Respiratory:  Negative for cough, shortness of breath and wheezing.   Cardiovascular:  Negative for chest pain and palpitations.  Gastrointestinal:  Negative for abdominal pain, blood in stool, constipation, diarrhea, nausea and vomiting.  Genitourinary:  Negative for dysuria, frequency, hematuria and urgency.  Musculoskeletal:  Negative for joint pain and myalgias.  Skin:  Negative for itching and rash.       (-) New moles.  Neurological:  Negative for headaches.      Objective:    Physical Exam Constitutional:      General: She is not in acute distress.    Appearance: Normal appearance. She is not ill-appearing.  HENT:     Head: Normocephalic and atraumatic.     Right Ear: Tympanic membrane, ear canal and external ear normal.     Left Ear: Tympanic membrane, ear canal and external ear normal.     Mouth/Throat:     Mouth: Mucous membranes are moist.     Pharynx: Oropharynx is clear.  Eyes:     Extraocular Movements: Extraocular movements intact.     Right eye: No nystagmus.     Left eye: No nystagmus.     Pupils: Pupils are equal, round, and reactive to light.  Neck:     Vascular: No carotid bruit.   Cardiovascular:     Rate and Rhythm: Normal rate and regular rhythm.     Pulses: Normal pulses.     Heart sounds: Normal heart sounds. No murmur heard.    No gallop.  Pulmonary:     Effort: Pulmonary effort is normal. No respiratory distress.     Breath sounds: Normal breath sounds. No wheezing or rales.  Abdominal:     General: Bowel sounds are normal.     Tenderness: There is no abdominal tenderness.  Musculoskeletal:     Comments: Muscle strength 5/5 on upper and lower extremities.   Lymphadenopathy:     Cervical: No cervical adenopathy.  Skin:    General: Skin is warm and dry.  Neurological:     Mental Status: She is alert and oriented to person, place, and time.     Deep Tendon Reflexes:     Reflex Scores:      Patellar reflexes are 2+ on the right side and 2+ on the left side. Psychiatric:        Mood and Affect: Mood normal.        Behavior: Behavior normal.        Judgment: Judgment normal.    BP 122/72 (BP Location: Right Arm, Patient Position: Sitting, Cuff Size: Small)   Pulse 64   Temp 97.6 F (36.4 C) (Oral)   Resp 16   Ht '4\' 11"'$  (1.499 m)   Wt 122 lb 9.6 oz (55.6 kg)   SpO2 94%   BMI 24.76 kg/m  Wt Readings from Last 3 Encounters:  09/22/21 122 lb 9.6 oz (55.6 kg)  02/28/21 118 lb 9.6 oz (53.8 kg)  01/10/21 118 lb (53.5 kg)   Diabetic Foot Exam - Simple   No data filed    Lab Results  Component Value Date   WBC 9.2 09/15/2021   HGB 13.2 09/15/2021   HCT 39.8 09/15/2021   PLT 390.0 09/15/2021   GLUCOSE 77 09/15/2021   CHOL 198 09/15/2021   TRIG 123.0 09/15/2021   HDL 81.70 09/15/2021   LDLDIRECT 135.0 05/08/2016   LDLCALC 91 09/15/2021   ALT 16 09/15/2021   AST 17 09/15/2021   NA 138 09/15/2021   K 4.9 09/15/2021   CL 102 09/15/2021   CREATININE 0.83 09/15/2021   BUN 14 09/15/2021   CO2 31 09/15/2021   TSH 1.56 09/15/2021   HGBA1C 6.1 09/15/2021   Lab Results  Component Value Date   TSH 1.56 09/15/2021   Lab Results  Component  Value Date   WBC 9.2 09/15/2021   HGB 13.2 09/15/2021   HCT 39.8 09/15/2021   MCV 93.3 09/15/2021   PLT 390.0 09/15/2021   Lab Results  Component Value Date   NA 138 09/15/2021   K 4.9 09/15/2021   CO2 31 09/15/2021   GLUCOSE 77 09/15/2021   BUN 14 09/15/2021   CREATININE 0.83 09/15/2021   BILITOT 0.6 09/15/2021   ALKPHOS 60 09/15/2021   AST 17 09/15/2021   ALT 16 09/15/2021   PROT 6.8 09/15/2021   ALBUMIN 4.4 09/15/2021   CALCIUM 10.2 09/15/2021   GFR 69.44 09/15/2021   Lab Results  Component Value Date   CHOL 198 09/15/2021   Lab Results  Component Value Date   HDL 81.70 09/15/2021   Lab Results  Component Value Date   LDLCALC 91 09/15/2021   Lab Results  Component Value Date   TRIG 123.0 09/15/2021   Lab Results  Component Value Date   CHOLHDL 2 09/15/2021   Lab Results  Component Value Date   HGBA1C 6.1 09/15/2021   Colonoscopy: Last completed on 08/11/2009. Repeat in 2021. Normal results.  Dexa: Last completed on 09/27/2020.  The BMD measured at Forearm Radius 33% is 0.599 g/cm2 with a T-score of -3.2. This patient is considered osteoporotic according to [facility]) criteria. L-3 was excluded due to degenerative changes. Compared with the prior study on, 11/17/2016 the BMD of the total mean shows a statistically significant decrease. The scan quality is good.  Mammogram: Last completed on 11/05/2020. Repeat in 2023. No mammographic evidence of malignancy.    Assessment & Plan:   Problem List Items Addressed This Visit     HTN (hypertension)    Well controlled, no changes to meds. Encouraged heart healthy diet such as the DASH diet and exercise as tolerated.       Relevant Orders   Hemoglobin A1c   CBC with Differential/Platelet   Comprehensive metabolic panel   Hyperlipidemia    Encourage heart healthy diet such as MIND or DASH diet, increase exercise, avoid trans fats, simple carbohydrates and processed foods, consider  a krill or fish or flaxseed oil cap daily.       Relevant Orders   Lipid panel   Osteoporosis    Encouraged to get adequate exercise, calcium and vitamin d intake      Preventative health care    Patient encouraged to maintain heart healthy diet, regular exercise, adequate sleep. Consider daily probiotics. Take medications as prescribed. Labs ordered and reviewed.Dexa: Last completed on 09/27/2020. Repeat in 2-4 years. MGM 10/22 repeat annually. Colonoscopy 2011 referred to gastroenterology for next colonoscopy      Hyperglycemia    hgba1c acceptable, minimize simple carbs. Increase exercise as tolerated. Continue current meds  Relevant Orders   TSH   Other Visit Diagnoses     Colon cancer screening    -  Primary   Relevant Orders   Ambulatory referral to Gastroenterology      No orders of the defined types were placed in this encounter.  I, Penni Homans, MD, personally preformed the services described in this documentation.  All medical record entries made by the scribe were at my direction and in my presence.  I have reviewed the chart and discharge instructions (if applicable) and agree that the record reflects my personal performance and is accurate and complete. 09/22/2021  I,Mohammed Iqbal,acting as a scribe for Penni Homans, MD.,have documented all relevant documentation on the behalf of Penni Homans, MD,as directed by  Penni Homans, MD while in the presence of Penni Homans, MD.  Penni Homans, MD

## 2021-09-22 NOTE — Assessment & Plan Note (Signed)
hgba1c acceptable, minimize simple carbs. Increase exercise as tolerated. Continue current meds 

## 2021-09-22 NOTE — Patient Instructions (Addendum)
RSV (Respiratory Syncitial Virus) Vaccine once   Hi dose flu shot in September or October  New COVID shot available in late September or early October   Preventive Care 65 Years and Older, Female Preventive care refers to lifestyle choices and visits with your health care provider that can promote health and wellness. Preventive care visits are also called wellness exams. What can I expect for my preventive care visit? Counseling Your health care provider may ask you questions about your: Medical history, including: Past medical problems. Family medical history. Pregnancy and menstrual history. History of falls. Current health, including: Memory and ability to understand (cognition). Emotional well-being. Home life and relationship well-being. Sexual activity and sexual health. Lifestyle, including: Alcohol, nicotine or tobacco, and drug use. Access to firearms. Diet, exercise, and sleep habits. Work and work Statistician. Sunscreen use. Safety issues such as seatbelt and bike helmet use. Physical exam Your health care provider will check your: Height and weight. These may be used to calculate your BMI (body mass index). BMI is a measurement that tells if you are at a healthy weight. Waist circumference. This measures the distance around your waistline. This measurement also tells if you are at a healthy weight and may help predict your risk of certain diseases, such as type 2 diabetes and high blood pressure. Heart rate and blood pressure. Body temperature. Skin for abnormal spots. What immunizations do I need?  Vaccines are usually given at various ages, according to a schedule. Your health care provider will recommend vaccines for you based on your age, medical history, and lifestyle or other factors, such as travel or where you work. What tests do I need? Screening Your health care provider may recommend screening tests for certain conditions. This may include: Lipid and  cholesterol levels. Hepatitis C test. Hepatitis B test. HIV (human immunodeficiency virus) test. STI (sexually transmitted infection) testing, if you are at risk. Lung cancer screening. Colorectal cancer screening. Diabetes screening. This is done by checking your blood sugar (glucose) after you have not eaten for a while (fasting). Mammogram. Talk with your health care provider about how often you should have regular mammograms. BRCA-related cancer screening. This may be done if you have a family history of breast, ovarian, tubal, or peritoneal cancers. Bone density scan. This is done to screen for osteoporosis. Talk with your health care provider about your test results, treatment options, and if necessary, the need for more tests. Follow these instructions at home: Eating and drinking  Eat a diet that includes fresh fruits and vegetables, whole grains, lean protein, and low-fat dairy products. Limit your intake of foods with high amounts of sugar, saturated fats, and salt. Take vitamin and mineral supplements as recommended by your health care provider. Do not drink alcohol if your health care provider tells you not to drink. If you drink alcohol: Limit how much you have to 0-1 drink a day. Know how much alcohol is in your drink. In the [place] one drink equals one 12 oz bottle of beer (355 mL), one 5 oz glass of wine (148 mL), or one 1 oz glass of hard liquor (44 mL). Lifestyle Brush your teeth every morning and night with fluoride toothpaste. Floss one time each day. Exercise for at least 30 minutes 5 or more days each week. Do not use any products that contain nicotine or tobacco. These products include cigarettes, chewing tobacco, and vaping devices, such as e-cigarettes. If you need help quitting, ask your health care provider. Do not  use drugs. If you are sexually active, practice safe sex. Use a condom or other form of protection in order to prevent STIs. Take aspirin only as told  by your health care provider. Make sure that you understand how much to take and what form to take. Work with your health care provider to find out whether it is safe and beneficial for you to take aspirin daily. Ask your health care provider if you need to take a cholesterol-lowering medicine (statin). Find healthy ways to manage stress, such as: Meditation, yoga, or listening to music. Journaling. Talking to a trusted person. Spending time with friends and family. Minimize exposure to UV radiation to reduce your risk of skin cancer. Safety Always wear your seat belt while driving or riding in a vehicle. Do not drive: If you have been drinking alcohol. Do not ride with someone who has been drinking. When you are tired or distracted. While texting. If you have been using any mind-altering substances or drugs. Wear a helmet and other protective equipment during sports activities. If you have firearms in your house, make sure you follow all gun safety procedures. What's next? Visit your health care provider once a year for an annual wellness visit. Ask your health care provider how often you should have your eyes and teeth checked. Stay up to date on all vaccines. This information is not intended to replace advice given to you by your health care provider. Make sure you discuss any questions you have with your health care provider. Document Revised: 07/14/2020 Document Reviewed: 07/14/2020 Elsevier Patient Education  [place].

## 2021-09-23 NOTE — Assessment & Plan Note (Signed)
Patient encouraged to maintain heart healthy diet, regular exercise, adequate sleep. Consider daily probiotics. Take medications as prescribed. Labs ordered and reviewed.Dexa: Last completed on 09/27/2020. Repeat in 2-4 years. MGM 10/22 repeat annually. Colonoscopy 2011 referred to gastroenterology for next colonoscopy

## 2021-09-23 NOTE — Assessment & Plan Note (Signed)
Encouraged to get adequate exercise, calcium and vitamin d intake 

## 2021-10-06 ENCOUNTER — Encounter: Payer: Self-pay | Admitting: Gastroenterology

## 2021-10-28 ENCOUNTER — Other Ambulatory Visit (HOSPITAL_BASED_OUTPATIENT_CLINIC_OR_DEPARTMENT_OTHER): Payer: Self-pay

## 2021-10-28 MED ORDER — FLUAD QUADRIVALENT 0.5 ML IM PRSY
PREFILLED_SYRINGE | INTRAMUSCULAR | 0 refills | Status: DC
  Filled 2021-10-28: qty 0.5, 1d supply, fill #0

## 2021-11-07 ENCOUNTER — Ambulatory Visit (HOSPITAL_BASED_OUTPATIENT_CLINIC_OR_DEPARTMENT_OTHER)
Admission: RE | Admit: 2021-11-07 | Discharge: 2021-11-07 | Disposition: A | Discharge status: Home / Self Care | Payer: Medicare Other | Source: Ambulatory Visit | Attending: Family Medicine | Admitting: Family Medicine

## 2021-11-07 ENCOUNTER — Encounter (HOSPITAL_BASED_OUTPATIENT_CLINIC_OR_DEPARTMENT_OTHER): Payer: Self-pay

## 2021-11-07 DIAGNOSIS — Z1231 Encounter for screening mammogram for malignant neoplasm of breast: Secondary | ICD-10-CM | POA: Insufficient documentation

## 2021-11-10 ENCOUNTER — Other Ambulatory Visit: Payer: Self-pay | Admitting: Family Medicine

## 2021-11-28 ENCOUNTER — Other Ambulatory Visit (HOSPITAL_BASED_OUTPATIENT_CLINIC_OR_DEPARTMENT_OTHER): Payer: Self-pay

## 2021-11-28 DIAGNOSIS — L814 Other melanin hyperpigmentation: Secondary | ICD-10-CM | POA: Diagnosis not present

## 2021-11-28 DIAGNOSIS — X32XXXS Exposure to sunlight, sequela: Secondary | ICD-10-CM | POA: Diagnosis not present

## 2021-11-28 DIAGNOSIS — L570 Actinic keratosis: Secondary | ICD-10-CM | POA: Diagnosis not present

## 2021-11-28 DIAGNOSIS — L821 Other seborrheic keratosis: Secondary | ICD-10-CM | POA: Diagnosis not present

## 2021-11-28 MED ORDER — COVID-19 MRNA VAC-TRIS(PFIZER) 30 MCG/0.3ML IM SUSY
0.3000 mL | PREFILLED_SYRINGE | Freq: Once | INTRAMUSCULAR | 0 refills | Status: AC
  Filled 2021-11-28: qty 0.3, 1d supply, fill #0

## 2021-12-14 ENCOUNTER — Encounter: Payer: Self-pay | Admitting: General Practice

## 2021-12-16 ENCOUNTER — Ambulatory Visit: Payer: Medicare Other | Admitting: Gastroenterology

## 2021-12-16 ENCOUNTER — Encounter: Payer: Self-pay | Admitting: Gastroenterology

## 2021-12-16 VITALS — BP 126/82 | HR 82 | Ht 59.0 in | Wt 124.0 lb

## 2021-12-16 DIAGNOSIS — Z01818 Encounter for other preprocedural examination: Secondary | ICD-10-CM | POA: Diagnosis not present

## 2021-12-16 DIAGNOSIS — Z800 Family history of malignant neoplasm of digestive organs: Secondary | ICD-10-CM | POA: Diagnosis not present

## 2021-12-16 DIAGNOSIS — Z1211 Encounter for screening for malignant neoplasm of colon: Secondary | ICD-10-CM

## 2021-12-16 NOTE — Patient Instructions (Addendum)
It has been recommended to you by your physician that you have a colonoscopy completed. Per your request, we did not schedule the procedure(s) today. Please contact our office at [phone] should you decide to have the procedure completed. You will be scheduled for a pre-visit and procedure at that time.   If you are age 74 or older, your body mass index should be between 23-30. Your Body mass index is 25.04 kg/m. If this is out of the aforementioned range listed, please consider follow up with your Primary Care Provider.  __________________________________________________________  The [facility] providers would like to encourage you to use [facility] to communicate with providers for non-urgent requests or questions.  Due to long hold times on the telephone, sending your provider a message by [place] may be a faster and more efficient way to get a response.  Please allow 48 business hours for a response.  Please remember that this is for non-urgent requests.   Due to recent changes in healthcare laws, you may see the results of your imaging and laboratory studies on MyChart before your provider has had a chance to review them.  We understand that in some cases there may be results that are confusing or concerning to you. Not all laboratory results come back in the same time frame and the provider may be waiting for multiple results in order to interpret others.  Please give [place] 48 hours in order for your provider to thoroughly review all the results before contacting the office for clarification of your results.     Thank you for choosing me and [facility].  Vito Cirigliano, D.O.

## 2021-12-16 NOTE — Progress Notes (Signed)
Chief Complaint: Discuss colon cancer screening   Referring Provider:     Mosie Lukes, MD    HPI:     Robin [name] is a 76 y.o. female with a history of HTN, HLD, osteoporosis, referred to the Gastroenterology Clinic for evaluation of colon cancer screening.   Completed Cologuard in 03/2019  Family history notable for father with Colon Cancer diagnosed in his late 65's, and lived into his 77's.   She is o/w w/o any GI sxs.   No anticoagulation or antiplatelet therapy.  Endoscopic History: - 08/11/2009: Colonoscopy at [facility]: Normal colon.  Medium sized internal hemorrhoids.  Recommended repeat in 5 years due to family history      Latest Ref Rng & Units 09/15/2021   10:45 AM 08/24/2020   11:38 AM 02/12/2020   11:41 AM  CBC  WBC 4.0 - 10.5 K/uL 9.2  10.1  9.3   Hemoglobin 12.0 - 15.0 g/dL 13.2  13.2  14.2   Hematocrit 36.0 - 46.0 % 39.8  39.4  42.4   Platelets 150.0 - 400.0 K/uL 390.0  367.0  421.0       Latest Ref Rng & Units 09/15/2021   10:45 AM 08/24/2020   11:38 AM 05/17/2020   11:11 AM  CMP  Glucose 70 - 99 mg/dL 77  92  97   BUN 6 - 23 mg/dL '14  7  11   '$ Creatinine 0.40 - 1.20 mg/dL 0.83  0.75  0.79   Sodium 135 - 145 mEq/L 138  138  137   Potassium 3.5 - 5.1 mEq/L 4.9  4.6  4.8   Chloride 96 - 112 mEq/L 102  103  102   CO2 19 - 32 mEq/L '31  28  30   '$ Calcium 8.4 - 10.5 mg/dL 10.2  10.0  10.4   Total Protein 6.0 - 8.3 g/dL 6.8  6.9  6.7   Total Bilirubin 0.2 - 1.2 mg/dL 0.6  0.5  0.6   Alkaline Phos 39 - 117 U/L 60  63  55   AST 0 - 37 U/L '17  16  20   '$ ALT 0 - 35 U/L '16  15  19      '$ 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   Grief reaction 06/04/2014   Hyperlipidemia    Hypertension    Inverted nipple    Bilateral   Medicare annual wellness visit, subsequent 06/20/2014   Medicare annual wellness visit,  subsequent 05/06/2015   Osteopenia    Preventative health care 06/20/2014   Medication: Review, verify sig & reconcile(including outside meds): Duplicates discarded: DM supply source:  Preferred Pharmacy and which med where:[facility] [place] [place] 90 day supply/mail order: 90 Local pharmacy: [place], [place] - [place]  Allergies verified:UTD  Immunization Status: Prompt   Rosacea 11/06/2016   Rotator cuff tear, right 11/26/2012     Past Surgical History:  Procedure Laterality Date   CATARACT EXTRACTION Right 12/2016   EYE SURGERY Left 07/08/13   OOPHORECTOMY Right    SHOULDER ARTHROSCOPY     Family History  Problem Relation Age of Onset   Cancer Father 68  colon, preCA?   Arthritis Father        rheumatoid arthritis   Heart disease Father        stents   Stroke Maternal Grandmother    Hypertension Maternal Grandmother    Heart disease Paternal Grandfather    COPD Mother        smoker   Hypertension Mother    Arthritis Sister    Urolithiasis Sister    Heart disease Son    Social History   Tobacco Use   Smoking status: Former    Packs/day: 0.50    Years: 29.00    Total pack years: 14.50    Types: Cigarettes    Start date: 01/03/1983   Smokeless tobacco: Never   Tobacco comments:    06-02-13  still smoking  Vaping Use   Vaping Use: Never used  Substance Use Topics   Alcohol use: Yes    Alcohol/week: 21.0 standard drinks of alcohol    Types: 21 Glasses of wine per week   Drug use: No   Current Outpatient Medications  Medication Sig Dispense Refill   calcium-vitamin D (OSCAL WITH D) 500-200 MG-UNIT per tablet Take 1 tablet by mouth.     famotidine (PEPCID) 40 MG tablet Take 40 mg by mouth daily.     influenza vaccine adjuvanted (FLUAD) 0.5 ML injection Inject into the muscle. 0.5 mL 0   losartan (COZAAR) 50 MG tablet Take 1 tablet (50 mg total) by mouth daily. 90  tablet 1   Multiple Vitamins-Minerals (MULTIVITAMIN WITH MINERALS) tablet Take 1 tablet by mouth daily.     Probiotic Product (ALIGN EXTRA STRENGTH PO) Take by mouth.     rosuvastatin (CRESTOR) 20 MG tablet Take 1 tablet by mouth once daily 90 tablet 1   valACYclovir (VALTREX) 500 MG tablet Take 1 tablet (500 mg total) by mouth 2 (two) times daily as needed. 60 tablet 2   Zoster Vaccine Adjuvanted [facility]) injection Inject into the muscle. 1 each 1   fluconazole (DIFLUCAN) 150 MG tablet Take 1 tablet (150 mg total) by mouth once a week. (Patient not taking: Reported on 01/10/2021) 2 tablet 1   influenza vaccine adjuvanted (FLUAD QUADRIVALENT) 0.5 ML injection Inject into the muscle. 0.5 mL 0   No current facility-administered medications for this visit.   Allergies  Allergen Reactions   Pravastatin Anaphylaxis    myalgias   Monistat [Miconazole]     ? Caused hives   Codeine Other (See Comments)     Review of Systems: All systems reviewed and negative except where noted in HPI.     Physical Exam:    Wt Readings from Last 3 Encounters:  12/16/21 124 lb (56.2 kg)  09/22/21 122 lb 9.6 oz (55.6 kg)  02/28/21 118 lb 9.6 oz (53.8 kg)    Ht '4\' 11"'$  (1.499 m)   Wt 124 lb (56.2 kg)   BMI 25.04 kg/m  Constitutional:  Pleasant, in no acute distress. Psychiatric: Normal mood and affect. Behavior is normal. Cardiovascular: Normal rate, regular rhythm. No edema Pulmonary/chest: Effort normal and breath sounds normal. No wheezing, rales or rhonchi. Abdominal: Soft, nondistended, nontender. Bowel sounds active throughout. There are no masses palpable. No hepatomegaly. Neurological: Alert and oriented to person place and time. Skin: Skin is warm and dry. No rashes noted.   ASSESSMENT AND PLAN;   1) Colon cancer screening 2) Family history of colon cancer (father) - Schedule colonoscopy for ongoing CRC screening.  To schedule in February per patient  request - If colonoscopy  normal/unremarkable, can likely forego continued screening given age along with age that her father was diagnosed with colon cancer  The indications, risks, and benefits of colonoscopy were explained to the patient in detail. Risks include but are not limited to bleeding, perforation, adverse reaction to medications, and cardiopulmonary compromise. Sequelae include but are not limited to the possibility of surgery, hospitalization, and mortality. The patient verbalized understanding and wished to proceed. All questions answered, referred to the scheduler and bowel prep ordered. Further recommendations pending results of the exam.     Lavena Bullion, DO, FACG  12/16/2021, 1:25 PM   Mosie Lukes, MD

## 2022-02-28 DIAGNOSIS — L570 Actinic keratosis: Secondary | ICD-10-CM | POA: Diagnosis not present

## 2022-02-28 DIAGNOSIS — L218 Other seborrheic dermatitis: Secondary | ICD-10-CM | POA: Diagnosis not present

## 2022-02-28 DIAGNOSIS — L814 Other melanin hyperpigmentation: Secondary | ICD-10-CM | POA: Diagnosis not present

## 2022-02-28 DIAGNOSIS — D239 Other benign neoplasm of skin, unspecified: Secondary | ICD-10-CM | POA: Diagnosis not present

## 2022-02-28 DIAGNOSIS — L821 Other seborrheic keratosis: Secondary | ICD-10-CM | POA: Diagnosis not present

## 2022-03-30 ENCOUNTER — Telehealth: Payer: Self-pay | Admitting: Gastroenterology

## 2022-03-30 NOTE — Telephone Encounter (Signed)
Returned call to patient. I explained to patient at the time of her last appt she did not have any GI symptoms and she has now mentioned IBS symptoms. Pt states that she thought she mentioned this at the time of her last visit, pt is okay with being seen in the office first. Pt did ask that I send her a diet for IBS via MyChart. Otherwise patient had no concerns at the end of the call.

## 2022-03-30 NOTE — Telephone Encounter (Signed)
Inbound call from patient, wishes to speak with nurse in regards to colonoscopy. When patient called, she stated she was having IBS symptoms/ flare up so she was scheduled an office visit prior to scheduling a recall colonoscopy directly. Patient is confused as to why this appointment was made, advised patient we could not schedule a colonoscopy if GI symptoms were persistent. She is requesting to speak to a nurse. Please advise.

## 2022-04-04 ENCOUNTER — Encounter: Payer: Self-pay | Admitting: Gastroenterology

## 2022-04-20 DIAGNOSIS — K080 Exfoliation of teeth due to systemic causes: Secondary | ICD-10-CM | POA: Diagnosis not present

## 2022-05-17 ENCOUNTER — Ambulatory Visit: Payer: Medicare Other | Admitting: Gastroenterology

## 2022-05-17 ENCOUNTER — Other Ambulatory Visit (HOSPITAL_COMMUNITY): Payer: Self-pay

## 2022-05-17 ENCOUNTER — Telehealth: Payer: Self-pay | Admitting: Pharmacy Technician

## 2022-05-17 ENCOUNTER — Encounter: Payer: Self-pay | Admitting: Gastroenterology

## 2022-05-17 VITALS — BP 128/76 | HR 85 | Ht 59.0 in | Wt 125.8 lb

## 2022-05-17 DIAGNOSIS — Z1211 Encounter for screening for malignant neoplasm of colon: Secondary | ICD-10-CM

## 2022-05-17 DIAGNOSIS — R140 Abdominal distension (gaseous): Secondary | ICD-10-CM

## 2022-05-17 DIAGNOSIS — R109 Unspecified abdominal pain: Secondary | ICD-10-CM

## 2022-05-17 DIAGNOSIS — K580 Irritable bowel syndrome with diarrhea: Secondary | ICD-10-CM

## 2022-05-17 MED ORDER — CLENPIQ 10-3.5-12 MG-GM -GM/175ML PO SOLN: 1.0000 | Freq: Once | ORAL | 0 refills | Status: AC

## 2022-05-17 MED ORDER — DICYCLOMINE HCL 10 MG PO CAPS: 10.0000 mg | ORAL_CAPSULE | Freq: Four times a day (QID) | ORAL | 3 refills | Status: DC | PRN

## 2022-05-17 NOTE — Progress Notes (Signed)
Chief Complaint:    Lower abdominal pain, bloating, excessive gas, IBS  GI History: 77 year old female with history of HTN, HLD, osteoporosis.  Family history notable for father with Colon Cancer diagnosed in his late 80's, and lived into his 63's.     No anticoagulation or antiplatelet therapy.   Endoscopic History: - 08/11/2009: Colonoscopy at [facility]: Normal colon.  Medium sized internal hemorrhoids.  Recommended repeat in 5 years due to family history  HPI:     Patient is a 77 y.o. female presenting to the Gastroenterology Clinic for follow-up.  Initially seen by me on 12/16/2021 to schedule colonoscopy for CRC screening and family history of colon cancer as outlined above.  Patient preference was to wait until February at the earliest.  She was otherwise without any GI symptoms at that time.  Today, she states she has been having lower abdominal pain, bloating, excessive gas.  She reports having history of IBS, but had not revealed any of that information at her initial appoint with me.    She reports having had sxs for 2+ years, and has treated with fiber supplement and probiotics with overall improvement.  Mainly loose, watery, nonbloody stools, but can have episodic constipation.  Abdominal pain resolves after BM. Worse with greasy foods, milk/cheese.  Was provided with low FODMAP diet last month, but has not implemented just yet.  Constipation if she uses full tablet of Imodium.    Otherwise, no new labs, imaging since last appointment.  Review of systems:     No chest pain, no SOB, no fevers, no urinary sx   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   Grief reaction 06/04/2014   Hyperlipidemia    Hypertension    Inverted nipple    Bilateral   Medicare annual wellness visit, subsequent 06/20/2014   Medicare  annual wellness visit, subsequent 05/06/2015   Osteopenia    Preventative health care 06/20/2014   Medication: Review, verify sig & reconcile(including outside meds): Duplicates discarded: DM supply source:  Preferred Pharmacy and which med where:[facility] - [place], [place] - [address] 90 day supply/mail order: 90 Local pharmacy: [facility] - [place], [place] - [address]  Allergies verified:UTD  Immunization Status: Prompt   Rosacea 11/06/2016   Rotator cuff tear, right 11/26/2012    Patient's surgical history, family medical history, social history, medications and allergies were all reviewed in Epic    Current Outpatient Medications  Medication Sig Dispense Refill   Bacillus Coagulans-Inulin (ALIGN PREBIOTIC-PROBIOTIC PO) Take by mouth.     calcium-vitamin D (OSCAL WITH D) 500-200 MG-UNIT per tablet Take 1 tablet by mouth.     famotidine (PEPCID) 40 MG tablet Take 40 mg by mouth daily.     influenza vaccine adjuvanted (FLUAD) 0.5 ML injection Inject into the muscle. 0.5 mL 0   losartan (COZAAR) 50 MG tablet Take 1 tablet (50 mg total) by mouth daily. 90 tablet 1   METAMUCIL FIBER PO Take by mouth.     Multiple Vitamins-Minerals (MULTIVITAMIN WITH MINERALS) tablet Take 1 tablet by mouth daily.     Probiotic Product (ALIGN EXTRA STRENGTH PO) Take by mouth.     rosuvastatin (CRESTOR) 20 MG tablet Take 1 tablet by mouth once daily 90 tablet 1  valACYclovir (VALTREX) 500 MG tablet Take 1 tablet (500 mg total) by mouth 2 (two) times daily as needed. 60 tablet 2   Zoster Vaccine Adjuvanted [facility]) injection Inject into the muscle. 1 each 1   No current facility-administered medications for this visit.    Physical Exam:     BP (!) 132/90   Pulse 85   Ht  (1.499 m)   Wt 125 lb 12.8 oz (57.1 kg)   SpO2 99%   BMI 25.41 kg/m   GENERAL:  Pleasant female in NAD PSYCH: Cooperative, normal affect EENT:   conjunctiva pink, mucous membranes moist, neck supple without masses CARDIAC:  RRR, no murmur heard, no peripheral edema PULM: Normal respiratory effort, lungs CTA bilaterally, no wheezing ABDOMEN:  Nondistended NEURO: Alert and oriented x 3, no focal neurologic deficits   IMPRESSION and PLAN:    1) Colon cancer screening 2) Family history of colon cancer (father) - Schedule colonoscopy  3) Change in bowel habits 4) IBS-D 5) Lower abdominal pain/cramping 6) Abdominal bloating - Evaluate for medical/luminal pathology time colonoscopy as above - Trial course of Bentyl 10 mg PO prn Q6H - Continue fiber supplement - Start low FODMAP diet.  Provided detailed instructions today - Ok to resume probiotic - Ok to use half tablet of Imodium prior to travel  The indications, risks, and benefits of colonoscopy were explained to the patient in detail. Risks include but are not limited to bleeding, perforation, adverse reaction to medications, and cardiopulmonary compromise. Sequelae include but are not limited to the possibility of surgery, hospitalization, and mortality. The patient verbalized understanding and wished to proceed. All questions answered, referred to the scheduler and bowel prep ordered. Further recommendations pending results of the exam.           Shellia Cleverly ,DO, FACG 05/17/2022, 10:23 AM

## 2022-05-17 NOTE — Patient Instructions (Addendum)
We have sent the following medications to your pharmacy for you to pick up at your convenience: Clenpiq, Bentyl  You have been scheduled for a colonoscopy. Please follow written instructions given to you at your visit today.  Please pick up your prep supplies at the pharmacy within the next 1-3 days. If you use inhalers (even only as needed), please bring them with you on the day of your procedure.    _______________________________________________________  If your blood pressure at your visit was 140/90 or greater, please contact your primary care physician to follow up on this.  _______________________________________________________  If you are age 75 or older, your body mass index should be between 23-30. Your Body mass index is 25.41 kg/m. If this is out of the aforementioned range listed, please consider follow up with your Primary Care Provider.  __________________________________________________________  The [facility] providers would like to encourage you to use [facility] to communicate with providers for non-urgent requests or questions.  Due to long hold times on the telephone, sending your provider a message by [facility] may be a faster and more efficient way to get a response.  Please allow 48 business hours for a response.  Please remember that this is for non-urgent requests.   Due to recent changes in healthcare laws, you may see the results of your imaging and laboratory studies on MyChart before your provider has had a chance to review them.  We understand that in some cases there may be results that are confusing or concerning to you. Not all laboratory results come back in the same time frame and the provider may be waiting for multiple results in order to interpret others.  Please give [place] 48 hours in order for your provider to thoroughly review all the results before contacting the office for clarification of your results.   Thank you for choosing me and [facility].  Vito Cirigliano, D.O.

## 2022-05-17 NOTE — Telephone Encounter (Signed)
Patient Advocate Encounter  Received notification from [facility] that prior authorization for DICYCLOMINE  is required.   PA submitted on 4.17.24 Key [id number] Status is pending

## 2022-05-18 ENCOUNTER — Other Ambulatory Visit (HOSPITAL_COMMUNITY): Payer: Self-pay

## 2022-05-18 NOTE — Telephone Encounter (Signed)
Patient Advocate Encounter  Prior Authorization for DICYCLOMINE  has been approved with [employer].    Per [facility] test claim, copay for 8 days supply is $1.60  PA# [id number] Effective dates: 4.17.24 through 4.17.25

## 2022-05-25 ENCOUNTER — Telehealth: Payer: Self-pay | Admitting: Gastroenterology

## 2022-05-25 NOTE — Telephone Encounter (Signed)
Patient's pharmacy is calling states patient's insurance does not cover Clenpiq. Looking for a possible alternative. Please advise

## 2022-05-26 NOTE — Telephone Encounter (Signed)
Called patient. Advised her to come to the office to pick up sample of Clenpiq. Stated she is coming on Monday to pick up. No further questions at the end of call.

## 2022-06-03 ENCOUNTER — Other Ambulatory Visit: Payer: Self-pay | Admitting: Family Medicine

## 2022-06-06 DIAGNOSIS — H5213 Myopia, bilateral: Secondary | ICD-10-CM | POA: Diagnosis not present

## 2022-06-09 ENCOUNTER — Encounter: Payer: Self-pay | Admitting: Gastroenterology

## 2022-06-19 ENCOUNTER — Encounter: Payer: Self-pay | Admitting: Gastroenterology

## 2022-06-19 ENCOUNTER — Ambulatory Visit (AMBULATORY_SURGERY_CENTER): Payer: Medicare Other | Admitting: Gastroenterology

## 2022-06-19 VITALS — BP 140/51 | HR 60 | Temp 97.8°F | Resp 12 | Ht 59.0 in | Wt 125.0 lb

## 2022-06-19 DIAGNOSIS — K635 Polyp of colon: Secondary | ICD-10-CM | POA: Diagnosis not present

## 2022-06-19 DIAGNOSIS — D122 Benign neoplasm of ascending colon: Secondary | ICD-10-CM | POA: Diagnosis not present

## 2022-06-19 DIAGNOSIS — D120 Benign neoplasm of cecum: Secondary | ICD-10-CM

## 2022-06-19 DIAGNOSIS — D125 Benign neoplasm of sigmoid colon: Secondary | ICD-10-CM | POA: Diagnosis not present

## 2022-06-19 DIAGNOSIS — Z1211 Encounter for screening for malignant neoplasm of colon: Secondary | ICD-10-CM | POA: Diagnosis not present

## 2022-06-19 DIAGNOSIS — K5730 Diverticulosis of large intestine without perforation or abscess without bleeding: Secondary | ICD-10-CM

## 2022-06-19 DIAGNOSIS — R197 Diarrhea, unspecified: Secondary | ICD-10-CM

## 2022-06-19 DIAGNOSIS — R140 Abdominal distension (gaseous): Secondary | ICD-10-CM

## 2022-06-19 DIAGNOSIS — R109 Unspecified abdominal pain: Secondary | ICD-10-CM

## 2022-06-19 DIAGNOSIS — Z800 Family history of malignant neoplasm of digestive organs: Secondary | ICD-10-CM

## 2022-06-19 MED ORDER — SODIUM CHLORIDE 0.9 % IV SOLN
500.0000 mL | Freq: Once | INTRAVENOUS | Status: DC
Start: 2022-06-19 — End: 2022-06-19

## 2022-06-19 NOTE — Progress Notes (Signed)
Called to room to assist during endoscopic procedure.  Patient ID and intended procedure confirmed with present staff. Received instructions for my participation in the procedure from the performing physician.  

## 2022-06-19 NOTE — Patient Instructions (Signed)
Await pathology results.  Continue present medications. Use fiber, for example Citrucel, Fibercon, Konsyl, or Metamucil.  YOU HAD AN ENDOSCOPIC PROCEDURE TODAY AT THE [facility]:   Refer to the procedure report that was given to you for any specific questions about what was found during the examination.  If the procedure report does not answer your questions, please call your gastroenterologist to clarify.  If you requested that your care partner not be given the details of your procedure findings, then the procedure report has been included in a sealed envelope for you to review at your convenience later.  YOU SHOULD EXPECT: Some feelings of bloating in the abdomen. Passage of more gas than usual.  Walking can help get rid of the air that was put into your GI tract during the procedure and reduce the bloating. If you had a lower endoscopy (such as a colonoscopy or flexible sigmoidoscopy) you may notice spotting of blood in your stool or on the toilet paper. If you underwent a bowel prep for your procedure, you may not have a normal bowel movement for a few days.  Please Note:  You might notice some irritation and congestion in your nose or some drainage.  This is from the oxygen used during your procedure.  There is no need for concern and it should clear up in a day or so.  SYMPTOMS TO REPORT IMMEDIATELY:  Following lower endoscopy (colonoscopy or flexible sigmoidoscopy):  Excessive amounts of blood in the stool  Significant tenderness or worsening of abdominal pains  Swelling of the abdomen that is new, acute  Fever of 100F or higher   For urgent or emergent issues, a gastroenterologist can be reached at any hour by calling [phone]. Do not use MyChart messaging for urgent concerns.    DIET:  We do recommend a small meal at first, but then you may proceed to your regular diet.  Drink plenty of fluids but you should avoid alcoholic beverages for 24 hours.  ACTIVITY:   You should plan to take it easy for the rest of today and you should NOT DRIVE or use heavy machinery until tomorrow (because of the sedation medicines used during the test).    FOLLOW UP: Our staff will call the number listed on your records the next business day following your procedure.  We will call around 7:15- 8:00 am to check on you and address any questions or concerns that you may have regarding the information given to you following your procedure. If we do not reach you, we will leave a message.     If any biopsies were taken you will be contacted by phone or by letter within the next 1-3 weeks.  Please call us at [phone] if you have not heard about the biopsies in 3 weeks.    SIGNATURES/CONFIDENTIALITY: You and/or your care partner have signed paperwork which will be entered into your electronic medical record.  These signatures attest to the fact that that the information above on your After Visit Summary has been reviewed and is understood.  Full responsibility of the confidentiality of this discharge information lies with you and/or your care-partner.

## 2022-06-19 NOTE — Op Note (Signed)
[facility] Patient Name: Robin [name] Procedure Date: 06/19/2022 2:03 PM MRN: [id number] Endoscopist: Doristine Locks , MD, [id number] Age: 77 Referring MD:  Date of Birth: [birth year] Gender: Female Account #: [id number] Procedure:                Colonoscopy Indications:              Screening in patient at increased risk: Colorectal                            cancer in father 35 or older                           Separately, also with Lower abdominal pain, Change                            in bowel habits, Diarrhea, Abdominal cramping. Medicines:                Monitored Anesthesia Care Procedure:                Pre-Anesthesia Assessment:                           - Prior to the procedure, a History and Physical                            was performed, and patient medications and                            allergies were reviewed. The patient's tolerance of                            previous anesthesia was also reviewed. The risks                            and benefits of the procedure and the sedation                            options and risks were discussed with the patient.                            All questions were answered, and informed consent                            was obtained. Prior Anticoagulants: The patient has                            taken no anticoagulant or antiplatelet agents. ASA                            Grade Assessment: II - A patient with mild systemic                            disease. After reviewing the risks and benefits,  the patient was deemed in satisfactory condition to                            undergo the procedure.                           After obtaining informed consent, the colonoscope                            was passed under direct vision. Throughout the                            procedure, the patient's blood pressure, pulse, and                            oxygen saturations were  monitored continuously. The                            CF [id number] #[id number] was introduced through the anus                            and advanced to the the terminal ileum. The                            colonoscopy was performed without difficulty. The                            patient tolerated the procedure well. The quality                            of the bowel preparation was good. The terminal                            ileum, ileocecal valve, appendiceal orifice, and                            rectum were photographed. Scope In: 2:14:30 PM Scope Out: 2:31:49 PM Scope Withdrawal Time: 0 hours 14 minutes 29 seconds  Total Procedure Duration: 0 hours 17 minutes 19 seconds  Findings:                 The perianal and digital rectal examinations were                            normal.                           Two sessile polyps were found in the ascending                            colon and cecum. The polyps were 3 to 6 mm in size.                            These polyps were removed with a cold snare.  Resection and retrieval were complete. Estimated                            blood loss was minimal.                           A 3 mm polyp was found in the sigmoid colon. The                            polyp was sessile. The polyp was removed with a                            cold snare. Resection and retrieval were complete.                            Estimated blood loss was minimal.                           A few small-mouthed diverticula were found in the                            sigmoid colon.                           Normal mucosa was found in the entire colon. No                            areas of mucosal erythema, edema, erosions, or                            ulceration. Biopsies for histology were taken with                            a cold forceps from the right colon and left colon                            for evaluation of microscopic  colitis. Estimated                            blood loss was minimal.                           The retroflexed view of the distal rectum and anal                            verge was normal and showed no anal or rectal                            abnormalities.                           The terminal ileum appeared normal. Complications:            No immediate complications. Estimated Blood Loss:     Estimated blood loss was  minimal. Impression:               - Two 3 to 6 mm polyps in the ascending colon and                            in the cecum, removed with a cold snare. Resected                            and retrieved.                           - One 3 mm polyp in the sigmoid colon, removed with                            a cold snare. Resected and retrieved.                           - Diverticulosis in the sigmoid colon.                           - Normal mucosa in the entire examined colon.                            Biopsied.                           - The distal rectum and anal verge are normal on                            retroflexion view.                           - The examined portion of the ileum was normal.                           - The GI Genius (intelligent endoscopy module),                            computer-aided polyp detection system powered by AI                            was utilized to detect colorectal polyps through                            enhanced visualization during colonoscopy. Recommendation:           - Patient has a contact number available for                            emergencies. The signs and symptoms of potential                            delayed complications were discussed with the  patient. Return to normal activities tomorrow.                            Written discharge instructions were provided to the                            patient.                           - Resume previous diet.                            - Continue present medications.                           - Await pathology results.                           - Repeat colonoscopy for surveillance based on                            pathology results.                           - Return to GI office PRN.                           - Use fiber, for example Citrucel, Fibercon, Konsyl                            or Metamucil. Doristine Locks, MD 06/19/2022 2:41:05 PM

## 2022-06-19 NOTE — Progress Notes (Signed)
GASTROENTEROLOGY PROCEDURE H&P NOTE   Primary Care Physician: Bradd Canary, MD    Reason for Procedure:   CRC screening, Fhx of colon cancer, lower abdominal pain, blowing, change in bowel habits, diarrhea  Plan:    Colonoscopy   Patient is appropriate for endoscopic procedure(s) in the ambulatory ([facility]) setting.  The nature of the procedure, as well as the risks, benefits, and alternatives were carefully and thoroughly reviewed with the patient. Ample time for discussion and questions allowed. The patient understood, was satisfied, and agreed to proceed.     HPI: Robin [name] is a 77 y.o. female who presents for colonoscopy for CRC screening and Fhx of colon cancer (father with Colon Cancer diagnosed in his late 72's), along with evaluation of alternating bowel habits, loose stools, lower abdominal pain, cramping.   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   Grief reaction 06/04/2014   Hyperlipidemia    Hypertension    Inverted nipple    Bilateral   Medicare annual wellness visit, subsequent 06/20/2014   Medicare annual wellness visit, subsequent 05/06/2015   Osteopenia    Preventative health care 06/20/2014   Medication: Review, verify sig & reconcile(including outside meds): Duplicates discarded: DM supply source:  Preferred Pharmacy and which med where:[facility] - [place], [place] - [address] 90 day supply/mail order: 90 Local pharmacy: [facility] [place] - [address]  Allergies verified:UTD  Immunization Status: Prompt   Rosacea 11/06/2016   Rotator cuff tear, right 11/26/2012    Past Surgical History:  Procedure Laterality Date   CATARACT EXTRACTION Right 12/2016   EYE SURGERY Left 07/08/13   OOPHORECTOMY Right    SHOULDER  ARTHROSCOPY      Prior to Admission medications   Medication Sig Start Date End Date Taking? Authorizing Provider  Bacillus Coagulans-Inulin (ALIGN PREBIOTIC-PROBIOTIC PO) Take by mouth.    [provider]  calcium-vitamin D (OSCAL WITH D) 500-200 MG-UNIT per tablet Take 1 tablet by mouth.    [provider]  dicyclomine (BENTYL) 10 MG capsule Take 1 capsule (10 mg total) by mouth every 6 (six) hours as needed for spasms. 05/17/22   Dvonte Gatliff V, DO  famotidine (PEPCID) 40 MG tablet Take 40 mg by mouth daily.    [provider]  influenza vaccine adjuvanted (FLUAD) 0.5 ML injection Inject into the muscle. 11/17/20   Judyann Munson, MD  losartan (COZAAR) 50 MG tablet Take 1 tablet by mouth once daily 06/05/22   Bradd Canary, MD  METAMUCIL FIBER PO Take by mouth.    [provider]  Multiple Vitamins-Minerals (MULTIVITAMIN WITH MINERALS) tablet Take 1 tablet by mouth daily.    [provider]  Probiotic Product (ALIGN EXTRA STRENGTH PO) Take by mouth.    [provider]  rosuvastatin (CRESTOR) 20 MG tablet Take 1 tablet by mouth once daily 04/20/20   Bradd Canary, MD  valACYclovir (VALTREX) 500 MG tablet Take 1 tablet (500 mg total) by mouth 2 (two) times daily as needed. 02/12/20   Bradd Canary, MD  Zoster Vaccine Adjuvanted [facility]) injection Inject into the muscle. 04/29/21   Judyann Munson, MD    Current Outpatient Medications  Medication Sig Dispense Refill   Bacillus Coagulans-Inulin (ALIGN PREBIOTIC-PROBIOTIC  PO) Take by mouth.     calcium-vitamin D (OSCAL WITH D) 500-200 MG-UNIT per tablet Take 1 tablet by mouth.     dicyclomine (BENTYL) 10 MG capsule Take 1 capsule (10 mg total) by mouth every 6 (six) hours as needed for spasms. 30 capsule 3   famotidine (PEPCID) 40 MG tablet Take 40 mg by mouth daily.     influenza vaccine adjuvanted (FLUAD) 0.5 ML injection Inject into the muscle. 0.5 mL 0   losartan (COZAAR) 50 MG  tablet Take 1 tablet by mouth once daily 90 tablet 1   METAMUCIL FIBER PO Take by mouth.     Multiple Vitamins-Minerals (MULTIVITAMIN WITH MINERALS) tablet Take 1 tablet by mouth daily.     Probiotic Product (ALIGN EXTRA STRENGTH PO) Take by mouth.     rosuvastatin (CRESTOR) 20 MG tablet Take 1 tablet by mouth once daily 90 tablet 1   valACYclovir (VALTREX) 500 MG tablet Take 1 tablet (500 mg total) by mouth 2 (two) times daily as needed. 60 tablet 2   Zoster Vaccine Adjuvanted [facility]) injection Inject into the muscle. 1 each 1   No current facility-administered medications for this visit.    Allergies as of 06/19/2022 - Review Complete 05/17/2022  Allergen Reaction Noted   Pravastatin Anaphylaxis 06/04/2014   Monistat [miconazole]  10/07/2019   Codeine Other (See Comments) 11/26/2012    Family History  Problem Relation Age of Onset   COPD Mother        smoker   Hypertension Mother    Colon cancer Father 67       colon, preCA?   Arthritis Father        rheumatoid arthritis   Heart disease Father        stents   Arthritis Sister    Urolithiasis Sister    Stroke Maternal Grandmother    Hypertension Maternal Grandmother    Heart disease Paternal Grandfather    Heart disease Son    Stomach cancer Neg Hx    Esophageal cancer Neg Hx     Social History   Socioeconomic History   Marital status: Widowed    Spouse name: Not on file   Number of children: 2   Years of education: Not on file   Highest education level: Not on file  Occupational History   Occupation: retired  Tobacco Use   Smoking status: Former    Packs/day: 0.50    Years: 29.00    Additional pack years: 0.00    Total pack years: 14.50    Types: Cigarettes    Start date: 01/03/1983   Smokeless tobacco: Never   Tobacco comments:    06-02-13  still smoking  Vaping Use   Vaping Use: Never used  Substance and Sexual Activity   Alcohol use: Yes    Alcohol/week: 21.0 standard drinks of alcohol    Types:  21 Glasses of wine per week    Comment: 2-3 glasses of a day   Drug use: No   Sexual activity: Never    Comment: no dietary restrictions, smokes, lives with husband   Other Topics Concern   Not on file  Social History Narrative   Not on file   Social Determinants of Health   Financial Resource Strain: Low Risk  (07/18/2021)   Overall Financial Resource Strain (CARDIA)    Difficulty of Paying Living Expenses: Not hard at all  Food Insecurity: No Food Insecurity (07/18/2021)   Hunger Vital Sign    Worried  About Running Out of Food in the Last Year: Never true    Ran Out of Food in the Last Year: Never true  Transportation Needs: No Transportation Needs (07/18/2021)   PRAPARE - Administrator, Civil Service (Medical): No    Lack of Transportation (Non-Medical): No  Physical Activity: Sufficiently Active (07/18/2021)   Exercise Vital Sign    Days of Exercise per Week: 7 days    Minutes of Exercise per Session: 60 min  Stress: No Stress Concern Present (07/18/2021)   [employer] of Occupational Health - Occupational Stress Questionnaire    Feeling of Stress : Not at all  Social Connections: Moderately Isolated (07/18/2021)   Social Connection and Isolation Panel [NHANES]    Frequency of Communication with Friends and Family: More than three times a week    Frequency of Social Gatherings with Friends and Family: More than three times a week    Attends Religious Services: More than 4 times per year    Active Member of [employer] or Organizations: No    Attends Banker Meetings: Never    Marital Status: Widowed  Intimate Partner Violence: Not At Risk (07/18/2021)   Humiliation, Afraid, Rape, and Kick questionnaire    Fear of Current or Ex-Partner: No    Emotionally Abused: No    Physically Abused: No    Sexually Abused: No    Physical Exam: Vital signs in last 24 hours: @There  were no vitals taken for this visit. GEN: NAD EYE: Sclerae anicteric ENT:  MMM CV: Non-tachycardic Pulm: CTA b/l GI: Soft, NT/ND NEURO:  Alert & Oriented x 3   Doristine Locks, DO [facility]   06/19/2022 1:31 PM

## 2022-06-19 NOTE — Progress Notes (Signed)
Pt's states no medical or surgical changes since previsit or office visit. 

## 2022-06-19 NOTE — Progress Notes (Signed)
Report to PACU, RN, vss, BBS= Clear.  

## 2022-06-20 ENCOUNTER — Telehealth: Payer: Self-pay

## 2022-06-20 NOTE — Telephone Encounter (Signed)
  Follow up Call-     06/19/2022    1:36 PM  Call back number  Post procedure Call Back phone  # [phone]  Permission to leave phone message Yes     Patient questions:  Do you have a fever, pain , or abdominal swelling? No. Pain Score  0 *  Have you tolerated food without any problems? Yes.    Have you been able to return to your normal activities? Yes.    Do you have any questions about your discharge instructions: Diet   No. Medications  No. Follow up visit  No.  Do you have questions or concerns about your Care? No.  Actions: * If pain score is 4 or above: No action needed, pain <4.

## 2022-06-21 DIAGNOSIS — K080 Exfoliation of teeth due to systemic causes: Secondary | ICD-10-CM | POA: Diagnosis not present

## 2022-06-22 DIAGNOSIS — H524 Presbyopia: Secondary | ICD-10-CM | POA: Diagnosis not present

## 2022-06-26 ENCOUNTER — Other Ambulatory Visit: Payer: Self-pay | Admitting: Family Medicine

## 2022-06-27 ENCOUNTER — Other Ambulatory Visit: Payer: Self-pay

## 2022-08-30 ENCOUNTER — Encounter (INDEPENDENT_AMBULATORY_CARE_PROVIDER_SITE_OTHER): Payer: Self-pay

## 2022-09-24 NOTE — Assessment & Plan Note (Addendum)
Patient encouraged to maintain heart healthy diet, regular exercise, adequate sleep. Consider daily probiotics. Take medications as prescribed. Labs ordered and reviewed.Dexa: Last completed on 09/27/2020. Repeat in 2-4 years. MGM 10/23 repeat annually. Colonoscopy 05/2022 next colonoscopy 2029. Given and reviewed copy of ACP documents from [employer] and encouraged to complete and return

## 2022-09-24 NOTE — Assessment & Plan Note (Signed)
hgba1c acceptable, minimize simple carbs. Increase exercise as tolerated. Continue current meds 

## 2022-09-24 NOTE — Assessment & Plan Note (Addendum)
Encouraged to get adequate exercise, calcium and vitamin d intake. Consider Fosamax

## 2022-09-24 NOTE — Assessment & Plan Note (Signed)
Well controlled, no changes to meds. Encouraged heart healthy diet such as the DASH diet and exercise as tolerated.  °

## 2022-09-25 ENCOUNTER — Ambulatory Visit: Payer: Medicare Other | Admitting: Family Medicine

## 2022-09-25 VITALS — BP 128/68 | HR 71 | Temp 98.0°F | Resp 16 | Ht 59.0 in | Wt 123.0 lb

## 2022-09-25 DIAGNOSIS — M810 Age-related osteoporosis without current pathological fracture: Secondary | ICD-10-CM | POA: Diagnosis not present

## 2022-09-25 DIAGNOSIS — Z0000 Encounter for general adult medical examination without abnormal findings: Secondary | ICD-10-CM | POA: Diagnosis not present

## 2022-09-25 DIAGNOSIS — I10 Essential (primary) hypertension: Secondary | ICD-10-CM | POA: Diagnosis not present

## 2022-09-25 DIAGNOSIS — K589 Irritable bowel syndrome without diarrhea: Secondary | ICD-10-CM | POA: Diagnosis not present

## 2022-09-25 DIAGNOSIS — R739 Hyperglycemia, unspecified: Secondary | ICD-10-CM | POA: Diagnosis not present

## 2022-09-25 NOTE — Progress Notes (Signed)
Subjective:    Patient ID: Robin [name], female    DOB: 04-Feb-1945, 77 y.o.   MRN: [id number]  Chief Complaint  Patient presents with  . Annual Exam    Annual Exam    HPI Discussed the use of AI scribe software for clinical note transcription with the patient, who gave verbal consent to proceed.  History of Present Illness   The patient, with a history of skin sensitivity and irritable bowel syndrome, presents with a chief complaint of jock itch on one side that has been present for over a week. The itch has been causing significant discomfort, leading the patient to use polysporin and foot fungal powder for relief. The patient also reports a reaction to their deodorant, Secret gel, which causes burning. The patient has a history of skin sensitivity and breaks out in rashes. The patient also reports issues with irritable bowel syndrome and is trying to follow a FODMAP diet. The patient also reports occasional pain in the right flank.        Past Medical History:  Diagnosis Date  . Acute bronchitis 11/06/2015  . Arthritis   . Elevated cholesterol   . Foot drop, right 04/07/2019   Confirmed with EMG February 2021  . Frozen shoulder 10/2011  . Grief reaction 06/04/2014  . Hyperlipidemia   . Hypertension   . Inverted nipple    Bilateral  . Medicare annual wellness visit, subsequent 06/20/2014  . Medicare annual wellness visit, subsequent 05/06/2015  . Osteopenia   . Preventative health care 06/20/2014   Medication: Review, verify sig & reconcile(including outside meds): Duplicates discarded: DM supply source:  Preferred Pharmacy and which med where:[facility] - [place], [place] - [address] 90 day supply/mail order: 90 Local pharmacy: [facility] [place]  - [address]  Allergies verified:UTD  Immunization Status: Prompt  . Rosacea 11/06/2016  . Rotator cuff tear, right 11/26/2012    Past Surgical  History:  Procedure Laterality Date  . CATARACT EXTRACTION Right 12/2016  . EYE SURGERY Left 07/08/13  . OOPHORECTOMY Right   . SHOULDER ARTHROSCOPY      Family History  Problem Relation Age of Onset  . COPD Mother        smoker  . Hypertension Mother   . Colon cancer Father 78       colon, preCA?  Marland Kitchen Arthritis Father        rheumatoid arthritis  . Heart disease Father        stents  . Arthritis Sister   . Urolithiasis Sister   . Stroke Maternal Grandmother   . Hypertension Maternal Grandmother   . Heart disease Paternal Grandfather   . Heart disease Son   . Stomach cancer Neg Hx   . Esophageal cancer Neg Hx     Social History   Socioeconomic History  . Marital status: Widowed    Spouse name: Not on file  . Number of children: 2  . Years of education: Not on file  . Highest education level: Not on file  Occupational History  . Occupation: retired  Tobacco Use  . Smoking status: Former    Current packs/day: 0.50    Average packs/day: 0.5 packs/day for 39.7 years (19.9 ttl pk-yrs)    Types: Cigarettes    Start date: 01/03/1983  . Smokeless tobacco: Never  . Tobacco comments:    06-02-13  still smoking  Vaping Use  . Vaping status: Never  Used  Substance and Sexual Activity  . Alcohol use: Yes    Alcohol/week: 21.0 standard drinks of alcohol    Types: 21 Glasses of wine per week    Comment: 2-3 glasses of a day  . Drug use: No  . Sexual activity: Never    Comment: no dietary restrictions, smokes, lives with husband   Other Topics Concern  . Not on file  Social History Narrative  . Not on file   Social Determinants of Health   Financial Resource Strain: Low Risk  (07/18/2021)   Overall Financial Resource Strain (CARDIA)   . Difficulty of Paying Living Expenses: Not hard at all  Food Insecurity: No Food Insecurity (07/18/2021)   Hunger Vital Sign   . Worried About Programme researcher, broadcasting/film/video in the Last Year: Never true   . Ran Out of Food in the Last Year: Never true   Transportation Needs: No Transportation Needs (07/18/2021)   PRAPARE - Transportation   . Lack of Transportation (Medical): No   . Lack of Transportation (Non-Medical): No  Physical Activity: Sufficiently Active (07/18/2021)   Exercise Vital Sign   . Days of Exercise per Week: 7 days   . Minutes of Exercise per Session: 60 min  Stress: No Stress Concern Present (07/18/2021)   [employer] of Occupational Health - Occupational Stress Questionnaire   . Feeling of Stress : Not at all  Social Connections: Moderately Isolated (07/18/2021)   Social Connection and Isolation Panel [NHANES]   . Frequency of Communication with Friends and Family: More than three times a week   . Frequency of Social Gatherings with Friends and Family: More than three times a week   . Attends Religious Services: More than 4 times per year   . Active Member of Clubs or Organizations: No   . Attends Banker Meetings: Never   . Marital Status: Widowed  Intimate Partner Violence: Not At Risk (07/18/2021)   Humiliation, Afraid, Rape, and Kick questionnaire   . Fear of Current or Ex-Partner: No   . Emotionally Abused: No   . Physically Abused: No   . Sexually Abused: No    Outpatient Medications Prior to Visit  Medication Sig Dispense Refill  . Bacillus Coagulans-Inulin (ALIGN PREBIOTIC-PROBIOTIC PO) Take by mouth.    . calcium-vitamin D (OSCAL WITH D) 500-200 MG-UNIT per tablet Take 1 tablet by mouth.    . dicyclomine (BENTYL) 10 MG capsule Take 1 capsule (10 mg total) by mouth every 6 (six) hours as needed for spasms. 30 capsule 3  . famotidine (PEPCID) 40 MG tablet Take 40 mg by mouth daily.    Marland Kitchen losartan (COZAAR) 50 MG tablet Take 1 tablet by mouth once daily 90 tablet 1  . Multiple Vitamins-Minerals (MULTIVITAMIN WITH MINERALS) tablet Take 1 tablet by mouth daily.    . Probiotic Product (ALIGN EXTRA STRENGTH PO) Take by mouth.    . rosuvastatin (CRESTOR) 20 MG tablet Take 1 tablet by mouth once  daily 90 tablet 0  . valACYclovir (VALTREX) 500 MG tablet Take 1 tablet (500 mg total) by mouth 2 (two) times daily as needed. 60 tablet 2  . influenza vaccine adjuvanted (FLUAD) 0.5 ML injection Inject into the muscle. 0.5 mL 0  . Zoster Vaccine Adjuvanted [facility]) injection Inject into the muscle. 1 each 1   No facility-administered medications prior to visit.    Allergies  Allergen Reactions  . Pravastatin Anaphylaxis    myalgias  . Monistat [Miconazole]     ?  Caused hives  . Codeine Other (See Comments)    Review of Systems  Constitutional:  Positive for malaise/fatigue. Negative for chills and fever.  HENT:  Negative for congestion and hearing loss.   Eyes:  Negative for discharge.  Respiratory:  Negative for cough, sputum production and shortness of breath.   Cardiovascular:  Negative for chest pain, palpitations and leg swelling.  Gastrointestinal:  Positive for diarrhea. Negative for abdominal pain, blood in stool, constipation, heartburn, nausea and vomiting.  Genitourinary:  Negative for dysuria, frequency, hematuria and urgency.  Musculoskeletal:  Negative for back pain, falls and myalgias.  Skin:  Positive for itching and rash.  Neurological:  Negative for dizziness, sensory change, loss of consciousness, weakness and headaches.  Endo/Heme/Allergies:  Negative for environmental allergies. Does not bruise/bleed easily.  Psychiatric/Behavioral:  Negative for depression and suicidal ideas. The patient is not nervous/anxious and does not have insomnia.        Objective:    Physical Exam Constitutional:      General: She is not in acute distress.    Appearance: Normal appearance. She is not diaphoretic.  HENT:     Head: Normocephalic and atraumatic.     Right Ear: Tympanic membrane, ear canal and external ear normal.     Left Ear: Tympanic membrane, ear canal and external ear normal.     Nose: Nose normal.     Mouth/Throat:     Mouth: Mucous membranes are moist.      Pharynx: Oropharynx is clear. No oropharyngeal exudate.  Eyes:     General: No scleral icterus.       Right eye: No discharge.        Left eye: No discharge.     Conjunctiva/sclera: Conjunctivae normal.     Pupils: Pupils are equal, round, and reactive to light.  Neck:     Thyroid: No thyromegaly.  Cardiovascular:     Rate and Rhythm: Normal rate and regular rhythm.     Heart sounds: Normal heart sounds. No murmur heard. Pulmonary:     Effort: Pulmonary effort is normal. No respiratory distress.     Breath sounds: Normal breath sounds. No wheezing or rales.  Abdominal:     General: Bowel sounds are normal. There is no distension.     Palpations: Abdomen is soft. There is no mass.     Tenderness: There is no abdominal tenderness.  Musculoskeletal:        General: No tenderness. Normal range of motion.     Cervical back: Normal range of motion and neck supple.  Lymphadenopathy:     Cervical: No cervical adenopathy.  Skin:    General: Skin is warm and dry.     Findings: Rash present. No erythema.     Comments: Scattered small papular flesh colored lesions right axillae  Neurological:     General: No focal deficit present.     Mental Status: She is alert and oriented to person, place, and time.     Cranial Nerves: No cranial nerve deficit.     Coordination: Coordination normal.     Deep Tendon Reflexes: Reflexes are normal and symmetric. Reflexes normal.  Psychiatric:        Mood and Affect: Mood normal.        Behavior: Behavior normal.        Thought Content: Thought content normal.        Judgment: Judgment normal.    BP 128/68 (BP Location: Left Arm, Patient Position: Sitting, Cuff  Size: Normal)   Pulse 71   Temp 98 F (36.7 C) (Oral)   Resp 16   Ht 4\' 11"  (1.499 m)   Wt 123 lb (55.8 kg)   SpO2 98%   BMI 24.84 kg/m  Wt Readings from Last 3 Encounters:  09/25/22 123 lb (55.8 kg)  06/19/22 125 lb (56.7 kg)  05/17/22 125 lb 12.8 oz (57.1 kg)    Diabetic Foot  Exam - Simple   No data filed    Lab Results  Component Value Date   WBC 9.2 09/15/2021   HGB 13.2 09/15/2021   HCT 39.8 09/15/2021   PLT 390.0 09/15/2021   GLUCOSE 77 09/15/2021   CHOL 198 09/15/2021   TRIG 123.0 09/15/2021   HDL 81.70 09/15/2021   LDLDIRECT 135.0 05/08/2016   LDLCALC 91 09/15/2021   ALT 16 09/15/2021   AST 17 09/15/2021   NA 138 09/15/2021   K 4.9 09/15/2021   CL 102 09/15/2021   CREATININE 0.83 09/15/2021   BUN 14 09/15/2021   CO2 31 09/15/2021   TSH 1.56 09/15/2021   HGBA1C 6.1 09/15/2021    Lab Results  Component Value Date   TSH 1.56 09/15/2021   Lab Results  Component Value Date   WBC 9.2 09/15/2021   HGB 13.2 09/15/2021   HCT 39.8 09/15/2021   MCV 93.3 09/15/2021   PLT 390.0 09/15/2021   Lab Results  Component Value Date   NA 138 09/15/2021   K 4.9 09/15/2021   CO2 31 09/15/2021   GLUCOSE 77 09/15/2021   BUN 14 09/15/2021   CREATININE 0.83 09/15/2021   BILITOT 0.6 09/15/2021   ALKPHOS 60 09/15/2021   AST 17 09/15/2021   ALT 16 09/15/2021   PROT 6.8 09/15/2021   ALBUMIN 4.4 09/15/2021   CALCIUM 10.2 09/15/2021   GFR 69.44 09/15/2021   Lab Results  Component Value Date   CHOL 198 09/15/2021   Lab Results  Component Value Date   HDL 81.70 09/15/2021   Lab Results  Component Value Date   LDLCALC 91 09/15/2021   Lab Results  Component Value Date   TRIG 123.0 09/15/2021   Lab Results  Component Value Date   CHOLHDL 2 09/15/2021   Lab Results  Component Value Date   HGBA1C 6.1 09/15/2021       Assessment & Plan:  Primary hypertension Assessment & Plan: Well controlled, no changes to meds. Encouraged heart healthy diet such as the DASH diet and exercise as tolerated.   Orders: -     CBC with Differential/Platelet -     Comprehensive metabolic panel -     Lipid panel -     TSH  Hyperglycemia Assessment & Plan: hgba1c acceptable, minimize simple carbs. Increase exercise as tolerated. Continue current  meds  Orders: -     Lipid panel -     Hemoglobin A1c  Osteoporosis, unspecified osteoporosis type, unspecified pathological fracture presence Assessment & Plan: Encouraged to get adequate exercise, calcium and vitamin d intake. Consider Fosamax  Orders: -     VITAMIN D 25 Hydroxy (Vit-D Deficiency, Fractures)  Preventative health care Assessment & Plan: Patient encouraged to maintain heart healthy diet, regular exercise, adequate sleep. Consider daily probiotics. Take medications as prescribed. Labs ordered and reviewed.Dexa: Last completed on 09/27/2020. Repeat in 2-4 years. MGM 10/23 repeat annually. Colonoscopy 05/2022 next colonoscopy 2029. Given and reviewed copy of ACP documents from [employer] and encouraged to complete and return    Irritable  bowel syndrome, unspecified type Assessment & Plan: Is trying to avoid dairy and gluten but just started a week ago     Assessment and Plan    Tinea Corporis Unilateral rash with improvement on antifungal powder treatment. New onset of similar rash on contralateral side. -Continue Lotrimin powder for affected areas. -Consider use of Lotrimin cream for specific irritated spots. -Consider bleach baths once to twice a week (1 tablespoon of bleach in a full tub of water) to reduce skin fungal load.  Contact Dermatitis Possible reaction to Secret gel deodorant. -Discontinue Secret gel deodorant. -Consider trying Lume deodorant for sensitive skin.  Abdominal Pain Likely related to Irritable Bowel Syndrome (IBS). Pain controlled with Dicyclomine. -Continue Dicyclomine as needed for abdominal pain. -Consider dietary modifications and hydration strategies to manage IBS symptoms.  Right Flank Pain Recurrent, relieved by Advil. Likely musculoskeletal in nature. -Continue Advil as needed for pain. -Consider use of moist heat for pain relief.  General Health Maintenance -Order labs including thyroid, cholesterol, glucose,  metabolic panel, complete blood count. -Consider RSV vaccination (Arexvy) due to age. -Consider annual flu vaccination and COVID-19 booster. -Continue Calcium plus Vitamin D supplementation. Check Vitamin D levels in lab work. -Encourage minimum of 4000 steps per day for general health. -Consider dietary modifications including gluten-free bread and nitrate-free bacon.         Danise Edge, MD

## 2022-09-25 NOTE — Patient Instructions (Addendum)
RSV, Respiratory Syncitial Virus vaccine, Arexvy at pharmacy   Flu and covid booster each fall  Lotrimin cream AKA Terbenafine to the skin lesions  Lumi deodarant at [employer]  Mild soap   Moist heat   Bleach bath if persists weekly just 1 tbls  Vitacost website for GF breakfast foods including bars, cereal and granola, also can try Skratch electrolyte beverage packets  Try Trader Joes or Aldi's for GF bread  Bacon no nitrates   Chair Yoga  Netflix The Blue Zones  4000 steps or more a day  Preventive Care 65 Years and Older, Female Preventive care refers to lifestyle choices and visits with your health care provider that can promote health and wellness. Preventive care visits are also called wellness exams. What can I expect for my preventive care visit? Counseling Your health care provider may ask you questions about your: Medical history, including: Past medical problems. Family medical history. Pregnancy and menstrual history. History of falls. Current health, including: Memory and ability to understand (cognition). Emotional well-being. Home life and relationship well-being. Sexual activity and sexual health. Lifestyle, including: Alcohol, nicotine or tobacco, and drug use. Access to firearms. Diet, exercise, and sleep habits. Work and work Astronomer. Sunscreen use. Safety issues such as seatbelt and bike helmet use. Physical exam Your health care provider will check your: Height and weight. These may be used to calculate your BMI (body mass index). BMI is a measurement that tells if you are at a healthy weight. Waist circumference. This measures the distance around your waistline. This measurement also tells if you are at a healthy weight and may help predict your risk of certain diseases, such as type 2 diabetes and high blood pressure. Heart rate and blood pressure. Body temperature. Skin for abnormal spots. What immunizations do I need?  Vaccines are  usually given at various ages, according to a schedule. Your health care provider will recommend vaccines for you based on your age, medical history, and lifestyle or other factors, such as travel or where you work. What tests do I need? Screening Your health care provider may recommend screening tests for certain conditions. This may include: Lipid and cholesterol levels. Hepatitis C test. Hepatitis B test. HIV (human immunodeficiency virus) test. STI (sexually transmitted infection) testing, if you are at risk. Lung cancer screening. Colorectal cancer screening. Diabetes screening. This is done by checking your blood sugar (glucose) after you have not eaten for a while (fasting). Mammogram. Talk with your health care provider about how often you should have regular mammograms. BRCA-related cancer screening. This may be done if you have a family history of breast, ovarian, tubal, or peritoneal cancers. Bone density scan. This is done to screen for osteoporosis. Talk with your health care provider about your test results, treatment options, and if necessary, the need for more tests. Follow these instructions at home: Eating and drinking  Eat a diet that includes fresh fruits and vegetables, whole grains, lean protein, and low-fat dairy products. Limit your intake of foods with high amounts of sugar, saturated fats, and salt. Take vitamin and mineral supplements as recommended by your health care provider. Do not drink alcohol if your health care provider tells you not to drink. If you drink alcohol: Limit how much you have to 0-1 drink a day. Know how much alcohol is in your drink. In the [place] one drink equals one 12 oz bottle of beer (355 mL), one 5 oz glass of wine (148 mL), or one 1 oz glass  of hard liquor (44 mL). Lifestyle Brush your teeth every morning and night with fluoride toothpaste. Floss one time each day. Exercise for at least 30 minutes 5 or more days each week. Do not use  any products that contain nicotine or tobacco. These products include cigarettes, chewing tobacco, and vaping devices, such as e-cigarettes. If you need help quitting, ask your health care provider. Do not use drugs. If you are sexually active, practice safe sex. Use a condom or other form of protection in order to prevent STIs. Take aspirin only as told by your health care provider. Make sure that you understand how much to take and what form to take. Work with your health care provider to find out whether it is safe and beneficial for you to take aspirin daily. Ask your health care provider if you need to take a cholesterol-lowering medicine (statin). Find healthy ways to manage stress, such as: Meditation, yoga, or listening to music. Journaling. Talking to a trusted person. Spending time with friends and family. Minimize exposure to UV radiation to reduce your risk of skin cancer. Safety Always wear your seat belt while driving or riding in a vehicle. Do not drive: If you have been drinking alcohol. Do not ride with someone who has been drinking. When you are tired or distracted. While texting. If you have been using any mind-altering substances or drugs. Wear a helmet and other protective equipment during sports activities. If you have firearms in your house, make sure you follow all gun safety procedures. What's next? Visit your health care provider once a year for an annual wellness visit. Ask your health care provider how often you should have your eyes and teeth checked. Stay up to date on all vaccines. This information is not intended to replace advice given to you by your health care provider. Make sure you discuss any questions you have with your health care provider. Document Revised: 07/14/2020 Document Reviewed: 07/14/2020 Elsevier Patient Education  2024 [employer].

## 2022-09-25 NOTE — Assessment & Plan Note (Signed)
Is trying to avoid dairy and gluten but just started a week ago

## 2022-09-26 LAB — VITAMIN D 25 HYDROXY (VIT D DEFICIENCY, FRACTURES): VITD: 31.5 ng/mL (ref 30.00–100.00)

## 2022-09-26 LAB — CBC WITH DIFFERENTIAL/PLATELET
Basophils Absolute: 0.1 10*3/uL (ref 0.0–0.1)
Basophils Relative: 0.8 % (ref 0.0–3.0)
Eosinophils Absolute: 0.1 10*3/uL (ref 0.0–0.7)
Eosinophils Relative: 0.9 % (ref 0.0–5.0)
HCT: 41.3 % (ref 36.0–46.0)
Hemoglobin: 13.1 g/dL (ref 12.0–15.0)
Lymphocytes Relative: 21.5 % (ref 12.0–46.0)
Lymphs Abs: 2.5 10*3/uL (ref 0.7–4.0)
MCHC: 31.7 g/dL (ref 30.0–36.0)
MCV: 93.8 fl (ref 78.0–100.0)
Monocytes Absolute: 0.9 10*3/uL (ref 0.1–1.0)
Monocytes Relative: 7.6 % (ref 3.0–12.0)
Neutro Abs: 7.9 10*3/uL — ABNORMAL HIGH (ref 1.4–7.7)
Neutrophils Relative %: 69.2 % (ref 43.0–77.0)
Platelets: 408 10*3/uL — ABNORMAL HIGH (ref 150.0–400.0)
RBC: 4.4 Mil/uL (ref 3.87–5.11)
RDW: 14.1 % (ref 11.5–15.5)
WBC: 11.4 10*3/uL — ABNORMAL HIGH (ref 4.0–10.5)

## 2022-09-26 LAB — HEMOGLOBIN A1C: Hgb A1c MFr Bld: 5.8 % (ref 4.6–6.5)

## 2022-09-26 LAB — COMPREHENSIVE METABOLIC PANEL
ALT: 18 U/L (ref 0–35)
AST: 21 U/L (ref 0–37)
Albumin: 4.5 g/dL (ref 3.5–5.2)
Alkaline Phosphatase: 71 U/L (ref 39–117)
BUN: 11 mg/dL (ref 6–23)
CO2: 26 mEq/L (ref 19–32)
Calcium: 10.3 mg/dL (ref 8.4–10.5)
Chloride: 98 meq/L (ref 96–112)
Creatinine, Ser: 0.89 mg/dL (ref 0.40–1.20)
GFR: 63.4 mL/min (ref >60.00)
Glucose, Bld: 87 mg/dL (ref 70–99)
Potassium: 4.5 meq/L (ref 3.5–5.1)
Sodium: 134 meq/L — ABNORMAL LOW (ref 135–145)
Total Bilirubin: 0.8 mg/dL (ref 0.2–1.2)
Total Protein: 7.2 g/dL (ref 6.0–8.3)

## 2022-09-26 LAB — LIPID PANEL
Cholesterol: 214 mg/dL — ABNORMAL HIGH (ref 0–200)
HDL: 78.3 mg/dL (ref >39.00)
LDL Cholesterol: 108 mg/dL — ABNORMAL HIGH (ref 0–99)
NonHDL: 135.5
Total CHOL/HDL Ratio: 3
Triglycerides: 138 mg/dL (ref 0.0–149.0)
VLDL: 27.6 mg/dL (ref 0.0–40.0)

## 2022-09-26 LAB — TSH: TSH: 1.19 u[IU]/mL (ref 0.35–5.50)

## 2022-09-27 ENCOUNTER — Other Ambulatory Visit: Payer: Self-pay

## 2022-09-27 DIAGNOSIS — I10 Essential (primary) hypertension: Secondary | ICD-10-CM

## 2022-10-21 ENCOUNTER — Encounter: Payer: Self-pay | Admitting: Family Medicine

## 2022-10-23 ENCOUNTER — Ambulatory Visit (INDEPENDENT_AMBULATORY_CARE_PROVIDER_SITE_OTHER): Payer: Medicare Other | Admitting: Family Medicine

## 2022-10-23 ENCOUNTER — Telehealth: Payer: Self-pay

## 2022-10-23 ENCOUNTER — Encounter: Payer: Self-pay | Admitting: Family Medicine

## 2022-10-23 VITALS — BP 134/60 | HR 76 | Ht 59.0 in | Wt 121.0 lb

## 2022-10-23 DIAGNOSIS — R9431 Abnormal electrocardiogram [ECG] [EKG]: Secondary | ICD-10-CM | POA: Diagnosis not present

## 2022-10-23 DIAGNOSIS — R0789 Other chest pain: Secondary | ICD-10-CM | POA: Diagnosis not present

## 2022-10-23 DIAGNOSIS — E871 Hypo-osmolality and hyponatremia: Secondary | ICD-10-CM | POA: Diagnosis not present

## 2022-10-23 DIAGNOSIS — R1013 Epigastric pain: Secondary | ICD-10-CM

## 2022-10-23 DIAGNOSIS — E875 Hyperkalemia: Secondary | ICD-10-CM

## 2022-10-23 LAB — TROPONIN I: Troponin I: 3 ng/L (ref <=47)

## 2022-10-23 LAB — EKG 12-LEAD

## 2022-10-23 NOTE — Telephone Encounter (Signed)
Initial Comment She has chest pain, sweats, heart palpitations Translation No Nurse Assessment Nurse: Andrey Campanile, RN, Marylene Land Date/Time Lamount Cohen Time): 10/23/2022 8:47:04 AM Confirm and document reason for call. If symptomatic, describe symptoms. ---She has chest pain, sweats, heart palpitations which started on Friday. Started taking 81 mg of Aspirin. Does the patient have any new or worsening symptoms? ---Yes Will a triage be completed? ---Yes Related visit to physician within the last 2 weeks? ---No Does the PT have any chronic conditions? (i.e. diabetes, asthma, this includes High risk factors for pregnancy, etc.) ---Yes List chronic conditions. ---HTN, High Cholesterol Is this a behavioral health or substance abuse call? ---No Guidelines Guideline Title Affirmed Question Affirmed Notes Nurse Date/Time Lamount Cohen Time) Chest Pain [1] Chest pain lasts > 5 minutes AND [2] age > 27 Andrey Campanile, RN, Marylene Land 10/23/2022 8:48:49 AM Disp. Time Lamount Cohen Time) Disposition Final User 10/23/2022 8:44:49 AM Send to Urgent Carmelina Noun 10/23/2022 8:54:56 AM Call EMS 911 Now Yes Andrey Campanile, RN, Marylene Land 10/23/2022 8:55:29 AM 911 Outcome Documentation Andrey Campanile, RN, Marylene Land Reason: Refused. PLEASE NOTE: All timestamps contained within this report are represented as [place] Standard Time. CONFIDENTIALTY NOTICE: This fax transmission is intended only for the addressee. It contains information that is legally privileged, confidential or otherwise protected from use or disclosure. If you are not the intended recipient, you are strictly prohibited from reviewing, disclosing, copying using or disseminating any of this information or taking any action in reliance on or regarding this information. If you have received this fax in error, please notify us immediately by telephone so that we can arrange for its return to [place]. Phone: [phone], Toll-Free: [phone], Fax: [phone] Page: 2 of 2 Call Id:  [id number] Final Disposition 10/23/2022 8:54:56 AM Call EMS 911 Now Yes Andrey Campanile, RN, Rosalyn Charters Disagree/Comply Disagree Caller Understands Yes PreDisposition Did not know what to do Care Advice Given Per Guideline CALL EMS 911 NOW: * Immediate medical attention is needed. You need to hang up and call 911 (or an ambulance). Comments User: Donney Dice, RN Date/Time Lamount Cohen Time): 10/23/2022 8:54:52 AM Notified office per client directives, they will reach out to patient shortly. Referrals GO TO FACILITY UNDECIDED

## 2022-10-23 NOTE — Progress Notes (Signed)
Acute Office Visit  Subjective:     Patient ID: Robin [name], female    DOB: 06-Sep-1945, 77 y.o.   MRN: [id number]  Chief Complaint  Patient presents with   Abdominal Pain     Patient is in today for epigastric/chest pain.   Discussed the use of AI scribe software for clinical note transcription with the patient, who gave verbal consent to proceed.  History of Present Illness   The patient, with a history of irritable bowel syndrome (IBS), presents with a recent episode of severe chest discomfort, described as 'something big was sitting on my chest.' The discomfort was associated with profuse sweating, jitters, and a sensation of internal quivering. They also experienced tingling and cramping in their foot. The discomfort was worse when lying flat and improved with sitting up. They took two baby aspirin out of concern for a cardiac event, but did not experience much relief. The discomfort improved somewhat after taking Pepcid. They also report a recent change in diet to the FODMAP diet, which they found difficult to adhere to. They have been experiencing belching shortly after eating and have noticed an increase in their heart rate to 105 bpm with minimal activity. They also report a lack of energy and feelings of panic.            All review of systems negative except what is listed in the HPI      Objective:    BP 134/60   Pulse 76   Ht 4\' 11"  (1.499 m)   Wt 121 lb (54.9 kg)   SpO2 100%   BMI 24.44 kg/m    Physical Exam Vitals reviewed.  Constitutional:      Appearance: Normal appearance.  Cardiovascular:     Rate and Rhythm: Normal rate and regular rhythm.     Heart sounds: Normal heart sounds.  Pulmonary:     Effort: Pulmonary effort is normal.     Breath sounds: Normal breath sounds.  Abdominal:     General: Abdomen is flat. Bowel sounds are normal. There is no distension.     Palpations: Abdomen is soft. There is no mass.     Tenderness: There is no  guarding or rebound.     Comments: Mild tenderness to epigastric palpation   Skin:    General: Skin is warm and dry.  Neurological:     Mental Status: She is alert and oriented to person, place, and time.  Psychiatric:        Mood and Affect: Mood normal.        Behavior: Behavior normal.        Thought Content: Thought content normal.        Judgment: Judgment normal.     Results for orders placed or performed in visit on 10/23/22  EKG 12-Lead  Result Value Ref Range   EKG 12 lead    SR 71, RSR, Left axis-anterior fascicular block, anterior infarct age undetermined, no ST elevation      Assessment & Plan:   Problem List Items Addressed This Visit   None Visit Diagnoses     Epigastric pain    -  Primary   Relevant Orders   CBC with Differential/Platelet   Comprehensive metabolic panel   H. pylori breath test   EKG 12-Lead (Completed)   Troponin I   Chest discomfort       Relevant Orders   CBC with Differential/Platelet   Comprehensive metabolic panel   H. pylori breath  test   EKG 12-Lead (Completed)   Ambulatory referral to Cardiology   Troponin I   Abnormal EKG       Relevant Orders   Ambulatory referral to Cardiology   Troponin I          New onset of chest discomfort, associated with diaphoresis, palpitations, and dyspnea. Symptoms are worse with lying flat and improved with sitting up. Possible differential diagnoses include GERD, angina, or anxiety. Chronic symptoms of heartburn, currently managed with Pepcid. Symptoms improved with Pepcid, suggesting a possible GERD etiology for the chest pain. -EKG, SR 71, RSR, Left axis-anterior fascicular block, anterior infarct age undetermined, no ST elevation -Order cardiac enzymes to rule out myocardial infarction. -Order CMP, H pylori -Continue Pepcid as prescribed. Could try adding Prilosec.     No orders of the defined types were placed in this encounter.   Return if symptoms worsen or fail to  improve.  Clayborne Dana, NP

## 2022-10-23 NOTE — Telephone Encounter (Signed)
FYI: This call has been transferred to triage nurse: the Triage Nurse. Once the result note has been entered staff can address the message at that time.  Patient called in with the following symptoms:  Red Word:chest pain and Irregular Heartbeat   Please advise at Mobile [phone] (mobile)  Message is routed to Provider Pool.

## 2022-10-23 NOTE — Telephone Encounter (Signed)
Called pt and she is been seen today  with Ladona Ridgel, NP.

## 2022-10-23 NOTE — Telephone Encounter (Signed)
Pt was seen today in the office

## 2022-10-23 NOTE — Telephone Encounter (Signed)
Received call from Angela at [facility]- Pt has been having chest pain, sweats, and palpitations since Friday, they recommended she call 911 and go to ED. Pt refused.

## 2022-10-24 LAB — COMPREHENSIVE METABOLIC PANEL
ALT: 22 U/L (ref 0–35)
AST: 23 U/L (ref 0–37)
Albumin: 4.4 g/dL (ref 3.5–5.2)
Alkaline Phosphatase: 62 U/L (ref 39–117)
BUN: 10 mg/dL (ref 6–23)
CO2: 25 mEq/L (ref 19–32)
Calcium: 9.8 mg/dL (ref 8.4–10.5)
Chloride: 96 mEq/L (ref 96–112)
Creatinine, Ser: 0.95 mg/dL (ref 0.40–1.20)
GFR: 58.59 mL/min — ABNORMAL LOW (ref >60.00)
Glucose, Bld: 93 mg/dL (ref 70–99)
Potassium: 4 mEq/L (ref 3.5–5.1)
Sodium: 131 mEq/L — ABNORMAL LOW (ref 135–145)
Total Bilirubin: 0.4 mg/dL (ref 0.2–1.2)
Total Protein: 7.1 g/dL (ref 6.0–8.3)

## 2022-10-24 LAB — CBC WITH DIFFERENTIAL/PLATELET
Basophils Absolute: 0.1 K/uL (ref 0.0–0.1)
Basophils Relative: 1.2 % (ref 0.0–3.0)
Eosinophils Absolute: 0.1 K/uL (ref 0.0–0.7)
Eosinophils Relative: 0.9 % (ref 0.0–5.0)
HCT: 38 % (ref 36.0–46.0)
Hemoglobin: 12.4 g/dL (ref 12.0–15.0)
Lymphocytes Relative: 25.2 % (ref 12.0–46.0)
Lymphs Abs: 2.8 K/uL (ref 0.7–4.0)
MCHC: 32.7 g/dL (ref 30.0–36.0)
MCV: 93.7 fl (ref 78.0–100.0)
Monocytes Absolute: 1 K/uL (ref 0.1–1.0)
Monocytes Relative: 9.4 % (ref 3.0–12.0)
Neutro Abs: 6.9 K/uL (ref 1.4–7.7)
Neutrophils Relative %: 63.3 % (ref 43.0–77.0)
Platelets: 400 K/uL (ref 150.0–400.0)
RBC: 4.06 Mil/uL (ref 3.87–5.11)
RDW: 13.8 % (ref 11.5–15.5)
WBC: 10.9 K/uL — ABNORMAL HIGH (ref 4.0–10.5)

## 2022-10-24 NOTE — Addendum Note (Signed)
Addended by: Hyman Hopes B on: 10/24/2022 01:20 PM   Modules accepted: Orders

## 2022-10-26 LAB — H. PYLORI BREATH TEST: H. pylori Breath Test: NOT DETECTED

## 2022-10-31 ENCOUNTER — Other Ambulatory Visit (INDEPENDENT_AMBULATORY_CARE_PROVIDER_SITE_OTHER): Payer: Medicare Other

## 2022-10-31 DIAGNOSIS — E871 Hypo-osmolality and hyponatremia: Secondary | ICD-10-CM

## 2022-11-01 LAB — BASIC METABOLIC PANEL
BUN: 10 mg/dL (ref 6–23)
CO2: 29 meq/L (ref 19–32)
Calcium: 10.3 mg/dL (ref 8.4–10.5)
Chloride: 96 meq/L (ref 96–112)
Creatinine, Ser: 0.99 mg/dL (ref 0.40–1.20)
GFR: 55.76 mL/min — ABNORMAL LOW (ref >60.00)
Glucose, Bld: 101 mg/dL — ABNORMAL HIGH (ref 70–99)
Potassium: 4.9 meq/L (ref 3.5–5.1)
Sodium: 132 meq/L — ABNORMAL LOW (ref 135–145)

## 2022-11-01 NOTE — Addendum Note (Signed)
Addended by: Hyman Hopes B on: 11/01/2022 03:59 PM   Modules accepted: Orders

## 2022-11-08 ENCOUNTER — Other Ambulatory Visit (INDEPENDENT_AMBULATORY_CARE_PROVIDER_SITE_OTHER): Payer: Medicare Other

## 2022-11-08 DIAGNOSIS — E871 Hypo-osmolality and hyponatremia: Secondary | ICD-10-CM

## 2022-11-08 LAB — BASIC METABOLIC PANEL
BUN: 8 mg/dL (ref 6–23)
CO2: 28 meq/L (ref 19–32)
Calcium: 10.5 mg/dL (ref 8.4–10.5)
Chloride: 96 meq/L (ref 96–112)
Creatinine, Ser: 0.85 mg/dL (ref 0.40–1.20)
GFR: 66.94 mL/min (ref >60.00)
Glucose, Bld: 171 mg/dL — ABNORMAL HIGH (ref 70–99)
Potassium: 5.4 meq/L — ABNORMAL HIGH (ref 3.5–5.1)
Sodium: 133 meq/L — ABNORMAL LOW (ref 135–145)

## 2022-11-09 NOTE — Addendum Note (Signed)
Addended by: Hyman Hopes B on: 11/09/2022 07:52 AM   Modules accepted: Orders

## 2022-11-13 ENCOUNTER — Other Ambulatory Visit: Payer: Self-pay | Admitting: Family Medicine

## 2022-11-13 ENCOUNTER — Other Ambulatory Visit: Payer: Medicare Other

## 2022-11-25 ENCOUNTER — Other Ambulatory Visit: Payer: Self-pay | Admitting: Family Medicine

## 2022-11-30 ENCOUNTER — Other Ambulatory Visit: Payer: Self-pay | Admitting: Family Medicine

## 2022-11-30 ENCOUNTER — Ambulatory Visit: Payer: Medicare Other

## 2022-11-30 ENCOUNTER — Other Ambulatory Visit (HOSPITAL_BASED_OUTPATIENT_CLINIC_OR_DEPARTMENT_OTHER): Payer: Self-pay

## 2022-11-30 VITALS — BP 144/80 | HR 84 | Ht 59.0 in | Wt 117.0 lb

## 2022-11-30 DIAGNOSIS — I10 Essential (primary) hypertension: Secondary | ICD-10-CM

## 2022-11-30 DIAGNOSIS — R072 Precordial pain: Secondary | ICD-10-CM | POA: Insufficient documentation

## 2022-11-30 MED ORDER — FLUAD 0.5 ML IM SUSY
0.5000 mL | PREFILLED_SYRINGE | Freq: Once | INTRAMUSCULAR | 0 refills | Status: AC
  Filled 2022-11-30: qty 0.5, 1d supply, fill #0

## 2022-11-30 MED ORDER — CARVEDILOL 3.125 MG PO TABS
3.1250 mg | ORAL_TABLET | Freq: Two times a day (BID) | ORAL | 3 refills | Status: AC
Start: 2022-11-30 — End: ?

## 2022-11-30 NOTE — Patient Instructions (Signed)
Medication Instructions:  Start Carvedilol (Coreg) 3.125 mg twice a day  *If you need a refill on your cardiac medications before your next appointment, please call your pharmacy*   Lab Work: None ordered   If you have labs (blood work) drawn today and your tests are completely normal, you will receive your results only by: MyChart Message (if you have MyChart) OR A paper copy in the mail If you have any lab test that is abnormal or we need to change your treatment, we will call you to review the results.   Testing/Procedures: None ordered    Follow-Up: Follow up as needed   Other Instructions

## 2022-11-30 NOTE — Assessment & Plan Note (Signed)
Suboptimal control. Would recommend less than 130 over 80 mmHg. She is currently on losartan 50 mg once daily. Will further add carvedilol 3.125 mg twice daily, discussed with her potential risk benefits and side effects such as slow heart rate, low blood pressures, tiredness and fatigue

## 2022-11-30 NOTE — Assessment & Plan Note (Signed)
Noncardiac in description.  No further recurrence. Has good functional status. At this time no indication for further cardiac evaluation.  Offered echocardiogram for baseline cardiac structure and function assessment given her longstanding history of hypertension, she prefers to hold off at this time and mention she will notify us if she has any concerns of recurrence with chest pain.  Advised her to keep her still well-hydrated as she tends to not drink enough fluids through the day

## 2022-11-30 NOTE — Progress Notes (Signed)
Cardiology Consultation:    Date:  11/30/2022   ID:  Robin [name], DOB 10-07-1945, MRN [id number]  PCP:  Bradd Canary, MD  Cardiologist:  Luretha Murphy, MD   Referring MD: Clayborne Dana, NP   No chief complaint on file.    ASSESSMENT AND PLAN:   77 year old woman with no significant prior cardiac history, has past medical history of hypertension, hyperlipidemia, irritable bowel syndrome and GERD and atypical noncardiac sounding chest discomfort over a month ago that lasted for about a day.  Symptoms have resolved and no further recurrence.  Has good functional capacity and no exertional symptoms. She is pending evaluation with gastroenterologist.  Problem List Items Addressed This Visit     HTN (hypertension)    Suboptimal control. Would recommend less than 130 over 80 mmHg. She is currently on losartan 50 mg once daily. Will further add carvedilol 3.125 mg twice daily, discussed with her potential risk benefits and side effects such as slow heart rate, low blood pressures, tiredness and fatigue      Relevant Medications   carvedilol (COREG) 3.125 MG tablet   Precordial pain - Primary    Noncardiac in description.  No further recurrence. Has good functional status. At this time no indication for further cardiac evaluation.  Offered echocardiogram for baseline cardiac structure and function assessment given her longstanding history of hypertension, she prefers to hold off at this time and mention she will notify us if she has any concerns of recurrence with chest pain.  Advised her to keep her still well-hydrated as she tends to not drink enough fluids through the day      Relevant Orders   EKG 12-Lead (Completed)      History of Present Illness:    Robin [name] is a 77 y.o. female who is being seen today for the evaluation of chest pain at the request of Clayborne Dana, NP.   Pleasant woman lives by herself at home. No prior significant cardiac  history. Has a history of hypertension, hyperlipidemia, irritable bowel syndrome, GERD.  In September she had an episode of discomfort epigastric and chest that felt like heartburn and indigestion that lasted for about a day, with symptoms associated with diaphoresis, worse on laying down, relieved with Pepcid.  Noted heart rates to be mildly elevated up to 120 bpm during the episode.  She also took aspirin intermittently due to concerns about any underlying heart issues.  Symptoms have since subsided. She has been working on dietary modifications to help keep her irritable bowel syndrome symptoms and check.  Denies any further symptoms of chest pain or shortness of breath, orthopnea, paroxysmal nocturnal dyspnea, pedal edema.  Denies any palpitations. She works in her yard on a regular basis.  Mentions blood pressures at home typically in around 140 systolic.  Mentions has been doing well with taking her medications for blood pressure losartan 50 mg once a day. Rosuvastatin she takes it every other day as she tends to forget at bedtime.  EKG in the clinic today shows sinus rhythm heart rate 84/min, QRS with left axis deviation, duration 76 ms.  Blood work from 11-08-2022 with sodium 133 mildly hyponatremic, potassium 5.4 which is mildly elevated. BUN 8, creatinine 0.85, EGFR 67 Troponin I high-sensitivity was 3 on 10-23-2022.  Does not smoke. Does consume wine 5 out of 7 days, 2-3 diluted drinks of white wine.  Suggested she review this can upset her stomach and should cut back.  Drinks a  cup of coffee occasionally and sweet tea on a regular basis.  Her husband passed away 4 years ago from complications of prostate cancer. Her son lives in [place] and is around to help her out. Her daughter lives in [place].      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   Grief reaction 06/04/2014   Hyperlipidemia    Hypertension    Inverted nipple    Bilateral   Medicare annual wellness visit, subsequent 06/20/2014   Medicare annual wellness visit, subsequent 05/06/2015   Osteopenia    Preventative health care 06/20/2014   Medication: Review, verify sig & reconcile(including outside meds): Duplicates discarded: DM supply source:  Preferred Pharmacy and which med where:[facility] - [place], [place] - [address] 90 day supply/mail order: 90 Local pharmacy: [facility] [facility] [place] - [address]  Allergies verified:UTD  Immunization Status: Prompt   Rosacea 11/06/2016   Rotator cuff tear, right 11/26/2012    Past Surgical History:  Procedure Laterality Date   CATARACT EXTRACTION Right 12/2016   EYE SURGERY Left 07/08/13   OOPHORECTOMY Right    SHOULDER ARTHROSCOPY      Current Medications: Current Meds  Medication Sig   Bacillus Coagulans-Inulin (ALIGN PREBIOTIC-PROBIOTIC PO) Take by mouth.   calcium-vitamin D (OSCAL WITH D) 500-200 MG-UNIT per tablet Take 1 tablet by mouth.   carvedilol (COREG) 3.125 MG tablet Take 1 tablet (3.125 mg total) by mouth 2 (two) times daily.   dicyclomine (BENTYL) 10 MG capsule Take 1 capsule (10 mg total) by mouth every 6 (six) hours as needed for spasms.   famotidine (PEPCID) 40 MG tablet Take 40 mg by mouth daily.   losartan (COZAAR) 50 MG tablet Take 1 tablet by mouth once daily   Multiple Vitamins-Minerals (MULTIVITAMIN WITH MINERALS) tablet Take 1 tablet by mouth daily.   Probiotic Product (ALIGN EXTRA STRENGTH PO) Take by mouth.   rosuvastatin (CRESTOR) 20 MG tablet Take 1 tablet (20 mg total) by mouth daily.   valACYclovir (VALTREX) 500 MG tablet Take 1 tablet (500 mg total) by mouth 2 (two) times daily as needed.     Allergies:   Pravastatin, Monistat [miconazole], and Codeine   Social History    Socioeconomic History   Marital status: Widowed    Spouse name: Not on file   Number of children: 2   Years of education: Not on file   Highest education level: Not on file  Occupational History   Occupation: retired  Tobacco Use   Smoking status: Former    Current packs/day: 0.50    Average packs/day: 0.5 packs/day for 39.9 years (20.0 ttl pk-yrs)    Types: Cigarettes    Start date: 01/03/1983   Smokeless tobacco: Never   Tobacco comments:    06-02-13  still smoking  Vaping Use   Vaping status: Never Used  Substance and Sexual Activity   Alcohol use: Yes    Alcohol/week: 21.0 standard drinks of alcohol    Types: 21 Glasses of wine per week    Comment: 2-3 glasses of a day   Drug use: No   Sexual activity: Never    Comment: no dietary restrictions, smokes, lives with husband   Other Topics Concern   Not on file  Social History Narrative   Not on file   Social Determinants of Health   Financial Resource Strain: Low Risk  (07/18/2021)   Overall Financial Resource Strain (CARDIA)    Difficulty of Paying Living Expenses: Not hard at all  Food Insecurity: No Food Insecurity (07/18/2021)   Hunger Vital Sign    Worried About Running Out of Food in the Last Year: Never true    Ran Out of Food in the Last Year: Never true  Transportation Needs: No Transportation Needs (07/18/2021)   PRAPARE - Administrator, Civil Service (Medical): No    Lack of Transportation (Non-Medical): No  Physical Activity: Sufficiently Active (07/18/2021)   Exercise Vital Sign    Days of Exercise per Week: 7 days    Minutes of Exercise per Session: 60 min  Stress: No Stress Concern Present (07/18/2021)   [employer] of Occupational Health - Occupational Stress Questionnaire    Feeling of Stress : Not at all  Social Connections: Moderately Isolated (07/18/2021)   Social Connection and Isolation Panel [NHANES]    Frequency of Communication with Friends and Family: More than three times  a week    Frequency of Social Gatherings with Friends and Family: More than three times a week    Attends Religious Services: More than 4 times per year    Active Member of [employer] or Organizations: No    Attends Banker Meetings: Never    Marital Status: Widowed     Family History: The patient's family history includes Arthritis in her father and sister; COPD in her mother; Colon cancer (age of onset: 53) in her father; Heart disease in her father, paternal grandfather, and son; Hypertension in her maternal grandmother and mother; Stroke in her maternal grandmother; Urolithiasis in her sister. There is no history of Stomach cancer or Esophageal cancer. ROS:   Please see the history of present illness.    All 14 point review of systems negative except as described per history of present illness.  EKGs/Labs/Other Studies Reviewed:    The following studies were reviewed today:   EKG:  EKG Interpretation Date/Time:  Thursday November 30 2022 13:31:58 EDT Ventricular Rate:  84 PR Interval:  122 QRS Duration:  76 QT Interval:  370 QTC Calculation: 437 R Axis:   -35  Text Interpretation: Normal sinus rhythm Left axis deviation Confirmed by Huntley Dec reddy [phone]) on 11/30/2022 1:50:19 PM    Recent Labs: 09/25/2022: TSH 1.19 10/23/2022: ALT 22; Hemoglobin 12.4; Platelets 400.0 11/08/2022: BUN 8; Creatinine, Ser 0.85; Potassium 5.4 No hemolysis seen; Sodium 133  Recent Lipid Panel    Component Value Date/Time   CHOL 214 (H) 09/25/2022 1419   TRIG 138.0 09/25/2022 1419   HDL 78.30 09/25/2022 1419   CHOLHDL 3 09/25/2022 1419   VLDL 27.6 09/25/2022 1419   LDLCALC 108 (H) 09/25/2022 1419   LDLDIRECT 135.0 05/08/2016 1031    Physical Exam:    VS:  BP (!) 144/80   Pulse 84   Ht 4\' 11"  (1.499 m)   Wt 117 lb (53.1 kg)   SpO2 99%   BMI 23.63 kg/m     Wt Readings from Last 3 Encounters:  11/30/22 117 lb (53.1 kg)  10/23/22 121 lb (54.9 kg)  09/25/22 123 lb  (55.8 kg)     GENERAL:  Well nourished, well developed in no acute distress NECK: No JVD; No carotid bruits CARDIAC: RRR, S1 and S2 present, no murmurs, no rubs, no gallops  CHEST:  Clear to auscultation without rales, wheezing or rhonchi  Extremities: No pitting pedal edema. Pulses bilaterally symmetric with radial 2+ and dorsalis pedis 2+ NEUROLOGIC:  Alert and oriented x 3  Medication Adjustments/Labs and Tests Ordered: Current medicines are reviewed at length with the patient today.  Concerns regarding medicines are outlined above.  Orders Placed This Encounter  Procedures   EKG 12-Lead   Meds ordered this encounter  Medications   carvedilol (COREG) 3.125 MG tablet    Sig: Take 1 tablet (3.125 mg total) by mouth 2 (two) times daily.    Dispense:  180 tablet    Refill:  3    Signed, Nareg Breighner reddy Shaasia Odle, MD, MPH, [facility]. 11/30/2022 2:19 PM    [facility]

## 2022-12-11 ENCOUNTER — Other Ambulatory Visit (HOSPITAL_BASED_OUTPATIENT_CLINIC_OR_DEPARTMENT_OTHER): Payer: Self-pay | Admitting: Family Medicine

## 2022-12-11 DIAGNOSIS — Z139 Encounter for screening, unspecified: Secondary | ICD-10-CM

## 2022-12-14 DIAGNOSIS — H903 Sensorineural hearing loss, bilateral: Secondary | ICD-10-CM | POA: Diagnosis not present

## 2022-12-18 ENCOUNTER — Ambulatory Visit (HOSPITAL_BASED_OUTPATIENT_CLINIC_OR_DEPARTMENT_OTHER)
Admission: RE | Admit: 2022-12-18 | Discharge: 2022-12-18 | Disposition: A | Discharge status: Home / Self Care | Payer: Medicare Other | Source: Ambulatory Visit | Attending: Family Medicine | Admitting: Family Medicine

## 2022-12-18 ENCOUNTER — Encounter (HOSPITAL_BASED_OUTPATIENT_CLINIC_OR_DEPARTMENT_OTHER): Payer: Self-pay

## 2022-12-18 DIAGNOSIS — Z1231 Encounter for screening mammogram for malignant neoplasm of breast: Secondary | ICD-10-CM | POA: Insufficient documentation

## 2022-12-18 DIAGNOSIS — Z139 Encounter for screening, unspecified: Secondary | ICD-10-CM | POA: Diagnosis not present

## 2023-01-03 DIAGNOSIS — K080 Exfoliation of teeth due to systemic causes: Secondary | ICD-10-CM | POA: Diagnosis not present

## 2023-01-11 ENCOUNTER — Encounter: Payer: Self-pay | Admitting: Family Medicine

## 2023-01-11 NOTE — Telephone Encounter (Signed)
She was supposed to repeat a BMP in mid-October. Future orders are in. She can come anytime. Thanks!

## 2023-01-11 NOTE — Telephone Encounter (Signed)
Patient scheduled for tomorrow

## 2023-01-12 ENCOUNTER — Other Ambulatory Visit (INDEPENDENT_AMBULATORY_CARE_PROVIDER_SITE_OTHER): Payer: Medicare Other

## 2023-01-12 DIAGNOSIS — E875 Hyperkalemia: Secondary | ICD-10-CM | POA: Diagnosis not present

## 2023-01-12 DIAGNOSIS — I10 Essential (primary) hypertension: Secondary | ICD-10-CM

## 2023-01-12 LAB — CBC WITH DIFFERENTIAL/PLATELET
Basophils Absolute: 0.1 10*3/uL (ref 0.0–0.1)
Basophils Relative: 0.8 % (ref 0.0–3.0)
Eosinophils Absolute: 0.1 10*3/uL (ref 0.0–0.7)
Eosinophils Relative: 1.7 % (ref 0.0–5.0)
HCT: 39.4 % (ref 36.0–46.0)
Hemoglobin: 13 g/dL (ref 12.0–15.0)
Lymphocytes Relative: 27.3 % (ref 12.0–46.0)
Lymphs Abs: 2.3 10*3/uL (ref 0.7–4.0)
MCHC: 32.9 g/dL (ref 30.0–36.0)
MCV: 95.8 fL (ref 78.0–100.0)
Monocytes Absolute: 0.7 10*3/uL (ref 0.1–1.0)
Monocytes Relative: 8.1 % (ref 3.0–12.0)
Neutro Abs: 5.2 10*3/uL (ref 1.4–7.7)
Neutrophils Relative %: 62.1 % (ref 43.0–77.0)
Platelets: 413 10*3/uL — ABNORMAL HIGH (ref 150.0–400.0)
RBC: 4.11 Mil/uL (ref 3.87–5.11)
RDW: 13.4 % (ref 11.5–15.5)
WBC: 8.4 10*3/uL (ref 4.0–10.5)

## 2023-01-12 LAB — COMPREHENSIVE METABOLIC PANEL
ALT: 30 U/L (ref 0–35)
AST: 30 U/L (ref 0–37)
Albumin: 4.5 g/dL (ref 3.5–5.2)
Alkaline Phosphatase: 62 U/L (ref 39–117)
BUN: 7 mg/dL (ref 6–23)
CO2: 30 meq/L (ref 19–32)
Calcium: 10 mg/dL (ref 8.4–10.5)
Chloride: 98 meq/L (ref 96–112)
Creatinine, Ser: 0.73 mg/dL (ref 0.40–1.20)
GFR: 80.25 mL/min (ref >60.00)
Glucose, Bld: 106 mg/dL — ABNORMAL HIGH (ref 70–99)
Potassium: 4.7 meq/L (ref 3.5–5.1)
Sodium: 136 meq/L (ref 135–145)
Total Bilirubin: 0.6 mg/dL (ref 0.2–1.2)
Total Protein: 6.8 g/dL (ref 6.0–8.3)

## 2023-01-12 NOTE — Addendum Note (Signed)
Addended by: Bernerd Pho I on: 01/12/2023 10:53 AM   Modules accepted: Orders

## 2023-02-25 ENCOUNTER — Other Ambulatory Visit: Payer: Self-pay | Admitting: Gastroenterology

## 2023-03-01 DIAGNOSIS — L821 Other seborrheic keratosis: Secondary | ICD-10-CM | POA: Diagnosis not present

## 2023-03-01 DIAGNOSIS — L814 Other melanin hyperpigmentation: Secondary | ICD-10-CM | POA: Diagnosis not present

## 2023-03-01 DIAGNOSIS — L218 Other seborrheic dermatitis: Secondary | ICD-10-CM | POA: Diagnosis not present

## 2023-03-01 DIAGNOSIS — D1801 Hemangioma of skin and subcutaneous tissue: Secondary | ICD-10-CM | POA: Diagnosis not present

## 2023-05-24 ENCOUNTER — Other Ambulatory Visit (HOSPITAL_COMMUNITY): Payer: Self-pay

## 2023-05-24 ENCOUNTER — Other Ambulatory Visit: Payer: Self-pay | Admitting: Family Medicine

## 2023-05-24 ENCOUNTER — Telehealth: Payer: Self-pay

## 2023-05-24 NOTE — Telephone Encounter (Signed)
 Pharmacy Patient Advocate Encounter   Received notification from CoverMyMeds that prior authorization for Dicyclomine  HCl 10MG  capsules is required/requested.   Insurance verification completed.   The patient is insured through [facility] .   Prior Authorization for Dicyclomine  HCl 10MG  capsuleshas been APPROVED from 05-24-2023 to 05-23-2024   PA #/Case ID/Reference #: [id number]

## 2023-05-28 ENCOUNTER — Other Ambulatory Visit (HOSPITAL_COMMUNITY): Payer: Self-pay

## 2023-05-28 ENCOUNTER — Other Ambulatory Visit (HOSPITAL_BASED_OUTPATIENT_CLINIC_OR_DEPARTMENT_OTHER): Payer: Self-pay

## 2023-05-28 ENCOUNTER — Telehealth: Payer: Self-pay

## 2023-05-28 ENCOUNTER — Ambulatory Visit (INDEPENDENT_AMBULATORY_CARE_PROVIDER_SITE_OTHER): Admitting: Internal Medicine

## 2023-05-28 ENCOUNTER — Encounter: Payer: Self-pay | Admitting: Internal Medicine

## 2023-05-28 VITALS — BP 122/66 | HR 74 | Temp 98.6°F | Resp 18 | Ht 59.0 in | Wt 120.1 lb

## 2023-05-28 DIAGNOSIS — J9801 Acute bronchospasm: Secondary | ICD-10-CM | POA: Diagnosis not present

## 2023-05-28 DIAGNOSIS — J40 Bronchitis, not specified as acute or chronic: Secondary | ICD-10-CM | POA: Diagnosis not present

## 2023-05-28 MED ORDER — PREDNISONE 10 MG PO TABS
ORAL_TABLET | ORAL | 0 refills | Status: AC
  Filled 2023-05-28: qty 20, 8d supply, fill #0

## 2023-05-28 MED ORDER — ALBUTEROL SULFATE HFA 108 (90 BASE) MCG/ACT IN AERS: 2.0000 | INHALATION_SPRAY | Freq: Four times a day (QID) | RESPIRATORY_TRACT | 1 refills | Status: DC | PRN

## 2023-05-28 MED ORDER — AIRSUPRA 90-80 MCG/ACT IN AERO
2.0000 | INHALATION_SPRAY | Freq: Four times a day (QID) | RESPIRATORY_TRACT | 1 refills | Status: DC | PRN
  Filled 2023-05-28: qty 10.7, 30d supply, fill #0

## 2023-05-28 MED ORDER — AZITHROMYCIN 250 MG PO TABS: ORAL_TABLET | ORAL | 0 refills | Status: DC

## 2023-05-28 NOTE — Patient Instructions (Signed)
 For cough:  Take Mucinex DM or Robitussin-DM OTC.  Follow the instructions in the box.   For nasal congestion: -Use over-the-counter Flonase: 2 nasal sprays on each side of the nose in the morning until you feel better  Avoid decongestants such as  Pseudoephedrine or phenylephrine   For wheezing and chest congestion: --Airsupra 4 times a day as needed. --If the medication is too expensive, all will switch to albuterol .  Let me know. -- Take prednisone  as prescribed  Take the antibiotic, Zithromax , only if not better in the next few days  Call if not gradually better over the next  10 days   Call anytime if the symptoms are severe, you have high fever, short of breath, chest pain

## 2023-05-28 NOTE — Telephone Encounter (Signed)
 Copied from CRM [id number]. Topic: Clinical - Prescription Issue >> May 28, 2023  4:27 PM Juleen Oakland F wrote: Reason for CRM: Patient called says the AIRSUPRA is over $500 and would like the albuterol  sent to the pharmacy instead

## 2023-05-28 NOTE — Progress Notes (Signed)
 Subjective:    Patient ID: Robin [name], female    DOB: 1945-09-23, 78 y.o.   MRN: [id number]  DOS:  05/28/2023 Type of visit - description: Acute  Symptoms started 1 week ago. Started immediately after she cleaned her outdoor chairs w/  a chlorine solution. Sinus congestion mostly on the left, with clear discharge. Cough with a small amount of clear sputum production, some wheezing today. Denies fever or chills. No sick contacts. No nausea vomiting or myalgias. No GERD symptoms. History of bronchospasm.   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   Grief reaction 06/04/2014   Hyperlipidemia    Hypertension    Inverted nipple    Bilateral   Medicare annual wellness visit, subsequent 06/20/2014   Medicare annual wellness visit, subsequent 05/06/2015   Osteopenia    Preventative health care 06/20/2014   Medication: Review, verify sig & reconcile(including outside meds): Duplicates discarded: DM supply source:  Preferred Pharmacy and which med where:[facility] - [place], [place] - [address] 90 day supply/mail order: 90 Local pharmacy: [facility] [facility] [place] - [address]  Allergies verified:UTD  Immunization Status: Prompt   Rosacea 11/06/2016   Rotator cuff tear, right 11/26/2012    Past Surgical History:  Procedure Laterality Date   CATARACT EXTRACTION Right 12/2016   EYE SURGERY Left 07/08/13   OOPHORECTOMY Right    SHOULDER ARTHROSCOPY      Current Outpatient Medications  Medication Instructions   Bacillus Coagulans-Inulin (ALIGN PREBIOTIC-PROBIOTIC PO) Take by mouth.   calcium -vitamin D  (OSCAL WITH D) 500-200 MG-UNIT per tablet 1 tablet   carvedilol  (COREG ) 3.125 mg, Oral, 2 times daily   dicyclomine   (BENTYL ) 10 MG capsule TAKE 1 CAPSULE BY MOUTH EVERY 6 HOURS AS NEEDED FOR  SPASMS   famotidine (PEPCID) 40 mg, Daily   losartan  (COZAAR ) 50 mg, Oral, Daily   Multiple Vitamins-Minerals (MULTIVITAMIN WITH MINERALS) tablet 1 tablet, Daily   Probiotic Product (ALIGN EXTRA STRENGTH PO) Take by mouth.   rosuvastatin  (CRESTOR ) 20 mg, Oral, Daily   valACYclovir  (VALTREX ) 500 mg, Oral, 2 times daily PRN       Objective:   Physical Exam BP 122/66   Pulse 74   Temp 98.6 F (37 C) (Oral)   Resp 18   Ht 4\' 11"  (1.499 m)   Wt 120 lb 2 oz (54.5 kg)   SpO2 97%   BMI 24.26 kg/m  General:   Well developed, NAD, BMI noted. HEENT:  Normocephalic . Face symmetric, atraumatic. TMs normal.  Throat symmetric not red. Nose congested Lungs:  Few rhonchi bilaterally, increased expiratory time, very few end expiratory wheezes. Normal respiratory effort, no intercostal retractions, no accessory muscle use. Heart: RRR,  no murmur.  Lower extremities: no pretibial edema bilaterally  Skin: Not pale. Not jaundice Neurologic:  alert & oriented X3.  Speech normal, gait appropriate for age and unassisted Psych--  Cognition and judgment appear intact.  Cooperative with normal attention span and concentration.  Behavior appropriate. No anxious or depressed appearing.      Assessment     78 year old female, PMH includes HTN, high cholesterol, osteoporosis, DJD, GERD, presents with: Bronchitis, bronchospasm, sinusitis: Acute, new issues  Former smoker with no formal  diagnosis of asthma and COPD presents with cough, wheezing, sinus congestion probably triggered by fumes exposure, see HPI.. + h/o bronchospasm, was prescribed inhalers few years ago. Plan:  Mucinex DM, prednisone , Flonase, Airsupra inhalations 4 times daily as needed.  Proper technique of puffers discussed with patient.  Will let me know if it is too expensive. Zithromax  only if not gradually better. Care plan was carefully discussed  with the patient printed for her, see AVS.

## 2023-05-28 NOTE — Telephone Encounter (Signed)
 Sent!

## 2023-05-28 NOTE — Telephone Encounter (Signed)
 Pharmacy Patient Advocate Encounter   Received notification from CoverMyMeds that prior authorization for Airsupra 90-80MCG/ACT aerosol is required/requested.   Insurance verification completed.   The patient is insured through [facility] .   Per test claim: PA required; PA submitted to above mentioned insurance via CoverMyMeds Key/confirmation #/EOC [id number] Status is pending

## 2023-05-29 ENCOUNTER — Other Ambulatory Visit (HOSPITAL_BASED_OUTPATIENT_CLINIC_OR_DEPARTMENT_OTHER): Payer: Self-pay

## 2023-05-29 NOTE — Telephone Encounter (Signed)
 Pharmacy Patient Advocate Encounter  Received notification from [facility] that Prior Authorization for Airsupra 90-80MCG/ACT aerosolhas been DENIED.  Full denial letter will be uploaded to the media tab. See denial reason below.     PA #/Case ID/Reference #: [id number]

## 2023-06-08 DIAGNOSIS — H5213 Myopia, bilateral: Secondary | ICD-10-CM | POA: Diagnosis not present

## 2023-06-22 ENCOUNTER — Encounter: Payer: Self-pay | Admitting: Pharmacist

## 2023-06-22 NOTE — Progress Notes (Signed)
 Pharmacy Quality Measure Review  This patient is appearing on a report for failing the adherence measure for cholesterol (statin) medications in 2024. Following adherence for 2025.   Medication: rosuvastatin   Last fill date: 06/11/2023 for 90 day supply Also filled 03/04/2023 for 90 days.   Losartan  50mg  for 90 days supply on 05/25/2023 and 02/25/2023  No intervention needed at this time.  She has 1 refill remaining on rosuvastatin  and on losartan .  Will continue to follow and intervene as needed.   Cecilie Coffee, PharmD Clinical Pharmacist [facility]  Population Health [phone]

## 2023-07-05 DIAGNOSIS — K080 Exfoliation of teeth due to systemic causes: Secondary | ICD-10-CM | POA: Diagnosis not present

## 2023-09-26 NOTE — Assessment & Plan Note (Signed)
 Well controlled, no changes to meds. Encouraged heart healthy diet such as the DASH diet and exercise as tolerated.

## 2023-09-26 NOTE — Assessment & Plan Note (Signed)
 Encourage heart healthy diet such as MIND or DASH diet, increase exercise, avoid trans fats, simple carbohydrates and processed foods, consider a krill or fish or flaxseed oil cap daily.

## 2023-09-26 NOTE — Assessment & Plan Note (Signed)
 Encouraged to get adequate exercise, calcium and vitamin d intake

## 2023-09-26 NOTE — Assessment & Plan Note (Signed)
 hgba1c acceptable, minimize simple carbs. Increase exercise as tolerated. Continue current meds

## 2023-09-26 NOTE — Assessment & Plan Note (Signed)
 Patient encouraged to maintain heart healthy diet, regular exercise, adequate sleep. Consider daily probiotics. Take medications as prescribed. Labs ordered and reviewed.Dexa: Last completed on 09/27/2020. Repeat in 2-4 years. MGM 12/2022 repeat annually. Colonoscopy 05/2022 next colonoscopy 2029. Given and reviewed copy of ACP documents from [employer] and encouraged to complete and return.  Has aged out of paps

## 2023-09-27 ENCOUNTER — Encounter: Payer: Self-pay | Admitting: Family Medicine

## 2023-09-27 ENCOUNTER — Ambulatory Visit (INDEPENDENT_AMBULATORY_CARE_PROVIDER_SITE_OTHER): Payer: Medicare Other | Admitting: Family Medicine

## 2023-09-27 VITALS — BP 126/80 | HR 72 | Resp 16 | Ht 59.0 in | Wt 121.0 lb

## 2023-09-27 DIAGNOSIS — E785 Hyperlipidemia, unspecified: Secondary | ICD-10-CM | POA: Diagnosis not present

## 2023-09-27 DIAGNOSIS — Z0000 Encounter for general adult medical examination without abnormal findings: Secondary | ICD-10-CM | POA: Diagnosis not present

## 2023-09-27 DIAGNOSIS — R739 Hyperglycemia, unspecified: Secondary | ICD-10-CM

## 2023-09-27 DIAGNOSIS — I10 Essential (primary) hypertension: Secondary | ICD-10-CM

## 2023-09-27 DIAGNOSIS — M810 Age-related osteoporosis without current pathological fracture: Secondary | ICD-10-CM

## 2023-09-27 NOTE — Progress Notes (Signed)
 Subjective:    Patient ID: Robin [name], female    DOB: 1945-10-04, 78 y.o.   MRN: [id number]  Chief Complaint  Patient presents with   Annual Exam    Patient presents today for a physical exam.   Quality Metric Gaps    Lung cancer screening, AWV, TDAP, Hep C screening     HPI Discussed the use of AI scribe software for clinical note transcription with the patient, who gave verbal consent to proceed.  History of Present Illness Robin [name] is a 78 year old female who presents with concerns about irritable bowel syndrome, hearing issues, and skin irritation.  She experiences constipation lasting up to three days, followed by frequent bowel movements occurring three to four times in an hour, leading to discomfort and a sensation of being 'backed up'. She recalls a past episode of indigestion in September last year and was given a pill for it. No recent emergency room visits, breathing trouble, or chest pain.  She has ongoing hearing issues, necessitating frequent visits to the audiologist every two to three weeks for adjustments to her hearing aids. She can control the volume with her phone but struggles to hear people speaking, especially if they mumble, while she can hear music and other sounds clearly.  She experiences skin irritation and itching, particularly under her arm and on her elbows, described as a burning sensation without visible rash, which worsens with touch, such as from a blanket. She has used antifungal creams and powders without resolution, but symptoms are temporarily alleviated with thick ointments.  She mentions a past episode of heart racing attributed to anxiety. She takes carvedilol  3.125 mg once daily, although it was prescribed twice daily, to manage these symptoms and feels stable with this regimen.  She struggles with hydration, especially while working in the yard, and uses an IV hydration powder to help, although she finds it unpalatable. She drinks more  water when using bottled water rather than a cup.  She is retired and stays active with gym workouts, yard work, and Human resources officer. She lives in a two-story house and manages her household chores by rotating tasks.    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   Grief reaction 06/04/2014   Hyperlipidemia    Hypertension    Inverted nipple    Bilateral   Medicare annual wellness visit, subsequent 06/20/2014   Medicare annual wellness visit, subsequent 05/06/2015   Osteopenia    Preventative health care 06/20/2014   Medication: Review, verify sig & reconcile(including outside meds): Duplicates discarded: DM supply source:  Preferred Pharmacy and which med where:[facility] - [place], [place] - [address] 90 day supply/mail order: 90 Local pharmacy: [facility] [place] - [address]  Allergies verified:UTD  Immunization Status: Prompt   Rosacea 11/06/2016   Rotator cuff tear, right 11/26/2012    Past Surgical History:  Procedure Laterality Date   CATARACT EXTRACTION Right 12/2016   EYE SURGERY Left 07/08/13   OOPHORECTOMY Right    SHOULDER ARTHROSCOPY      Family History  Problem Relation Age of Onset   COPD Mother        smoker   Hypertension Mother    Colon cancer Father 76  colon, preCA?   Arthritis Father        rheumatoid arthritis   Heart disease Father        stents   Arthritis Sister    Urolithiasis Sister    Stroke Maternal Grandmother    Hypertension Maternal Grandmother    Heart disease Paternal Grandfather    Heart disease Son    Stomach cancer Neg Hx    Esophageal cancer Neg Hx     Social History   Socioeconomic History   Marital status: Widowed    Spouse name: Not on file   Number of children: 2   Years of  education: Not on file   Highest education level: Not on file  Occupational History   Occupation: retired  Tobacco Use   Smoking status: Former    Current packs/day: 0.50    Average packs/day: 0.5 packs/day for 40.7 years (20.4 ttl pk-yrs)    Types: Cigarettes    Start date: 01/03/1983   Smokeless tobacco: Never   Tobacco comments:    06-02-13  still smoking  Vaping Use   Vaping status: Never Used  Substance and Sexual Activity   Alcohol use: Yes    Alcohol/week: 21.0 standard drinks of alcohol    Types: 21 Glasses of wine per week    Comment: 2-3 glasses of a day   Drug use: No   Sexual activity: Never    Comment: no dietary restrictions, smokes, lives with husband   Other Topics Concern   Not on file  Social History Narrative   Not on file   Social Drivers of Health   Financial Resource Strain: Low Risk  (07/18/2021)   Overall Financial Resource Strain (CARDIA)    Difficulty of Paying Living Expenses: Not hard at all  Food Insecurity: No Food Insecurity (07/18/2021)   Hunger Vital Sign    Worried About Running Out of Food in the Last Year: Never true    Ran Out of Food in the Last Year: Never true  Transportation Needs: No Transportation Needs (07/18/2021)   PRAPARE - Administrator, Civil Service (Medical): No    Lack of Transportation (Non-Medical): No  Physical Activity: Sufficiently Active (07/18/2021)   Exercise Vital Sign    Days of Exercise per Week: 7 days    Minutes of Exercise per Session: 60 min  Stress: No Stress Concern Present (07/18/2021)   [employer] of Occupational Health - Occupational Stress Questionnaire    Feeling of Stress : Not at all  Social Connections: Moderately Isolated (07/18/2021)   Social Connection and Isolation Panel    Frequency of Communication with Friends and Family: More than three times a week    Frequency of Social Gatherings with Friends and Family: More than three times a week    Attends Religious Services: More  than 4 times per year    Active Member of [employer] or Organizations: No    Attends Banker Meetings: Never    Marital Status: Widowed  Intimate Partner Violence: Not At Risk (07/18/2021)   Humiliation, Afraid, Rape, and Kick questionnaire    Fear of Current or Ex-Partner: No    Emotionally Abused: No    Physically Abused: No    Sexually Abused: No    Outpatient Medications Prior to Visit  Medication Sig Dispense Refill   Bacillus Coagulans-Inulin (ALIGN PREBIOTIC-PROBIOTIC PO) Take by mouth.     calcium -vitamin D  (OSCAL WITH D) 500-200 MG-UNIT per tablet Take 1 tablet by mouth.  carvedilol  (COREG ) 3.125 MG tablet Take 1 tablet (3.125 mg total) by mouth 2 (two) times daily. 180 tablet 3   dicyclomine  (BENTYL ) 10 MG capsule TAKE 1 CAPSULE BY MOUTH EVERY 6 HOURS AS NEEDED FOR  SPASMS 30 capsule 3   famotidine (PEPCID) 40 MG tablet Take 40 mg by mouth daily.     losartan  (COZAAR ) 50 MG tablet Take 1 tablet by mouth once daily 90 tablet 1   Multiple Vitamins-Minerals (MULTIVITAMIN WITH MINERALS) tablet Take 1 tablet by mouth daily.     Probiotic Product (ALIGN EXTRA STRENGTH PO) Take by mouth.     rosuvastatin  (CRESTOR ) 20 MG tablet Take 1 tablet (20 mg total) by mouth daily. 90 tablet 3   valACYclovir  (VALTREX ) 500 MG tablet Take 1 tablet (500 mg total) by mouth 2 (two) times daily as needed. 60 tablet 2   albuterol  (VENTOLIN  HFA) 108 (90 Base) MCG/ACT inhaler Inhale 2 puffs into the lungs every 6 (six) hours as needed for wheezing or shortness of breath. 18 g 1   azithromycin  (ZITHROMAX  Z-PAK) 250 MG tablet 2 tabs a day the first day, then 1 tab a day x 4 days 6 tablet 0   No facility-administered medications prior to visit.    Allergies  Allergen Reactions   Pravastatin  Anaphylaxis    myalgias   Monistat [Miconazole]     ? Caused hives   Codeine Other (See Comments)    Review of Systems  Constitutional:  Negative for fever and malaise/fatigue.  HENT:  Negative for  congestion.   Eyes:  Negative for blurred vision.  Respiratory:  Negative for shortness of breath.   Cardiovascular:  Negative for chest pain, palpitations and leg swelling.  Gastrointestinal:  Negative for abdominal pain, blood in stool and nausea.  Genitourinary:  Negative for dysuria and frequency.  Musculoskeletal:  Positive for joint pain and myalgias. Negative for falls.  Skin:  Negative for rash.  Neurological:  Negative for dizziness, loss of consciousness and headaches.  Endo/Heme/Allergies:  Negative for environmental allergies.  Psychiatric/Behavioral:  Negative for depression. The patient is not nervous/anxious.        Objective:    Physical Exam Constitutional:      General: She is not in acute distress.    Appearance: Normal appearance. She is well-developed. She is not toxic-appearing.  HENT:     Head: Normocephalic and atraumatic.     Right Ear: External ear normal.     Left Ear: External ear normal.     Nose: Nose normal.  Eyes:     General:        Right eye: No discharge.        Left eye: No discharge.     Conjunctiva/sclera: Conjunctivae normal.  Neck:     Thyroid : No thyromegaly.  Cardiovascular:     Rate and Rhythm: Normal rate and regular rhythm.     Heart sounds: Normal heart sounds. No murmur heard. Pulmonary:     Effort: Pulmonary effort is normal. No respiratory distress.     Breath sounds: Normal breath sounds.  Abdominal:     General: Bowel sounds are normal.     Palpations: Abdomen is soft.     Tenderness: There is no abdominal tenderness. There is no guarding.  Musculoskeletal:        General: Normal range of motion.     Cervical back: Neck supple.  Lymphadenopathy:     Cervical: No cervical adenopathy.  Skin:    General: Skin is  warm and dry.  Neurological:     Mental Status: She is alert and oriented to person, place, and time.  Psychiatric:        Mood and Affect: Mood normal.        Behavior: Behavior normal.        Thought  Content: Thought content normal.        Judgment: Judgment normal.     BP 126/80   Pulse 72   Resp 16   Ht 4' 11 (1.499 m)   Wt 121 lb (54.9 kg)   SpO2 99%   BMI 24.44 kg/m  Wt Readings from Last 3 Encounters:  09/27/23 121 lb (54.9 kg)  05/28/23 120 lb 2 oz (54.5 kg)  11/30/22 117 lb (53.1 kg)    Diabetic Foot Exam - Simple   No data filed    Lab Results  Component Value Date   WBC 8.4 01/12/2023   HGB 13.0 01/12/2023   HCT 39.4 01/12/2023   PLT 413.0 (H) 01/12/2023   GLUCOSE 106 (H) 01/12/2023   CHOL 214 (H) 09/25/2022   TRIG 138.0 09/25/2022   HDL 78.30 09/25/2022   LDLDIRECT 135.0 05/08/2016   LDLCALC 108 (H) 09/25/2022   ALT 30 01/12/2023   AST 30 01/12/2023   NA 136 01/12/2023   K 4.7 01/12/2023   CL 98 01/12/2023   CREATININE 0.73 01/12/2023   BUN 7 01/12/2023   CO2 30 01/12/2023   TSH 1.19 09/25/2022   HGBA1C 5.8 09/25/2022    Lab Results  Component Value Date   TSH 1.19 09/25/2022   Lab Results  Component Value Date   WBC 8.4 01/12/2023   HGB 13.0 01/12/2023   HCT 39.4 01/12/2023   MCV 95.8 01/12/2023   PLT 413.0 (H) 01/12/2023   Lab Results  Component Value Date   NA 136 01/12/2023   K 4.7 01/12/2023   CO2 30 01/12/2023   GLUCOSE 106 (H) 01/12/2023   BUN 7 01/12/2023   CREATININE 0.73 01/12/2023   BILITOT 0.6 01/12/2023   ALKPHOS 62 01/12/2023   AST 30 01/12/2023   ALT 30 01/12/2023   PROT 6.8 01/12/2023   ALBUMIN 4.5 01/12/2023   CALCIUM  10.0 01/12/2023   GFR 80.25 01/12/2023   Lab Results  Component Value Date   CHOL 214 (H) 09/25/2022   Lab Results  Component Value Date   HDL 78.30 09/25/2022   Lab Results  Component Value Date   LDLCALC 108 (H) 09/25/2022   Lab Results  Component Value Date   TRIG 138.0 09/25/2022   Lab Results  Component Value Date   CHOLHDL 3 09/25/2022   Lab Results  Component Value Date   HGBA1C 5.8 09/25/2022       Assessment & Plan:  Preventative health care Assessment &  Plan: Patient encouraged to maintain heart healthy diet, regular exercise, adequate sleep. Consider daily probiotics. Take medications as prescribed. Labs ordered and reviewed.Dexa: Last completed on 09/27/2020. Repeat in 2-4 years. MGM 12/2022 repeat annually. Colonoscopy 05/2022 next colonoscopy 2029. Given and reviewed copy of ACP documents from [employer] and encouraged to complete and return.  Has aged out of paps  Orders: -     Comprehensive metabolic panel with GFR -     CBC with Differential/Platelet -     Lipid panel -     TSH -     VITAMIN D  25 Hydroxy (Vit-D Deficiency, Fractures)  Primary hypertension Assessment & Plan: Well controlled, no changes  to meds. Encouraged heart healthy diet such as the DASH diet and exercise as tolerated.   Orders: -     Comprehensive metabolic panel with GFR -     CBC with Differential/Platelet  Hyperglycemia Assessment & Plan: hgba1c acceptable, minimize simple carbs. Increase exercise as tolerated. Continue current meds  Orders: -     Hemoglobin A1c  Hyperlipidemia, unspecified hyperlipidemia type Assessment & Plan: Encourage heart healthy diet such as MIND or DASH diet, increase exercise, avoid trans fats, simple carbohydrates and processed foods, consider a krill or fish or flaxseed oil cap daily.   Orders: -     Lipid panel -     TSH  Osteoporosis, unspecified osteoporosis type, unspecified pathological fracture presence Assessment & Plan: Encouraged to get adequate exercise, calcium  and vitamin d  intake.   Orders: -     VITAMIN D  25 Hydroxy (Vit-D Deficiency, Fractures)    Assessment and Plan Assessment & Plan Adult Wellness Visit 78 year old female, retired, actively engages in physical activities such as gym workouts, yard work, and walking. Conscious of hydration and dietary intake. No recent emergency room visits or significant changes in family history. Managing health well and in a good place overall. -  Order lab work to check sodium, kidney function, liver function, blood sugar, potassium, calcium , complete blood count, cholesterol, and sugar levels. - Schedule follow-up in one year unless labs indicate otherwise.  Irritable bowel syndrome with constipation Experiences constipation with bowel movements occurring every three days, which is uncomfortable for her. Reports episodes of frequent bowel movements when symptoms flare up.  Postherpetic neuralgia, upper extremities Reports burning sensation and pain in the elbows and underarm area, possibly related to postherpetic neuralgia. Symptoms consistent with nerve damage after shingles, despite no history of a blistery rash. Symptoms affecting sleep. Discussed that treatment with gabapentin is not a cure but may help manage symptoms. She prefers to wait until after dermatology consultation before starting medication. - Refer to dermatology for further evaluation. - Consider gabapentin 100 mg at bedtime if symptoms become intolerable, but she prefers to wait until after dermatology consultation. - Recommend trying topical treatments such as aspirin cream or lidocaine  gel for symptomatic relief.  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 Can hear music and sounds but struggles with understanding speech, especially from family members who mumble. - Continue regular visits to the audiologist for hearing aid adjustments.  Recording duration: 21 minutes     Harlene Horton, MD

## 2023-09-27 NOTE — Patient Instructions (Addendum)
 Post Herpetic Neuralgia   Skratch electrolye powder online for hydration  Try topical creams such as Aspercreme or Lidocaine  get  Schedule visit with Robin [name]   Preventive Care 65 Years and Older, Female Preventive care refers to lifestyle choices and visits with your health care provider that can promote health and wellness. Preventive care visits are also called wellness exams. What can I expect for my preventive care visit? Counseling Your health care provider may ask you questions about your: Medical history, including: Past medical problems. Family medical history. Pregnancy and menstrual history. History of falls. Current health, including: Memory and ability to understand (cognition). Emotional well-being. Home life and relationship well-being. Sexual activity and sexual health. Lifestyle, including: Alcohol, nicotine or tobacco, and drug use. Access to firearms. Diet, exercise, and sleep habits. Work and work Astronomer. Sunscreen use. Safety issues such as seatbelt and bike helmet use. Physical exam Your health care provider will check your: Height and weight. These may be used to calculate your BMI (body mass index). BMI is a measurement that tells if you are at a healthy weight. Waist circumference. This measures the distance around your waistline. This measurement also tells if you are at a healthy weight and may help predict your risk of certain diseases, such as type 2 diabetes and high blood pressure. Heart rate and blood pressure. Body temperature. Skin for abnormal spots. What immunizations do I need?  Vaccines are usually given at various ages, according to a schedule. Your health care provider will recommend vaccines for you based on your age, medical history, and lifestyle or other factors, such as travel or where you work. What tests do I need? Screening Your health care provider may recommend screening tests for certain conditions. This may  include: Lipid and cholesterol levels. Hepatitis C test. Hepatitis B test. HIV (human immunodeficiency virus) test. STI (sexually transmitted infection) testing, if you are at risk. Lung cancer screening. Colorectal cancer screening. Diabetes screening. This is done by checking your blood sugar (glucose) after you have not eaten for a while (fasting). Mammogram. Talk with your health care provider about how often you should have regular mammograms. BRCA-related cancer screening. This may be done if you have a family history of breast, ovarian, tubal, or peritoneal cancers. Bone density scan. This is done to screen for osteoporosis. Talk with your health care provider about your test results, treatment options, and if necessary, the need for more tests. Follow these instructions at home: Eating and drinking  Eat a diet that includes fresh fruits and vegetables, whole grains, lean protein, and low-fat dairy products. Limit your intake of foods with high amounts of sugar, saturated fats, and salt. Take vitamin and mineral supplements as recommended by your health care provider. Do not drink alcohol if your health care provider tells you not to drink. If you drink alcohol: Limit how much you have to 0-1 drink a day. Know how much alcohol is in your drink. In the [place] one drink equals one 12 oz bottle of beer (355 mL), one 5 oz glass of wine (148 mL), or one 1 oz glass of hard liquor (44 mL). Lifestyle Brush your teeth every morning and night with fluoride toothpaste. Floss one time each day. Exercise for at least 30 minutes 5 or more days each week. Do not use any products that contain nicotine or tobacco. These products include cigarettes, chewing tobacco, and vaping devices, such as e-cigarettes. If you need help quitting, ask your health care provider. Do not  use drugs. If you are sexually active, practice safe sex. Use a condom or other form of protection in order to prevent STIs. Take  aspirin only as told by your health care provider. Make sure that you understand how much to take and what form to take. Work with your health care provider to find out whether it is safe and beneficial for you to take aspirin daily. Ask your health care provider if you need to take a cholesterol-lowering medicine (statin). Find healthy ways to manage stress, such as: Meditation, yoga, or listening to music. Journaling. Talking to a trusted person. Spending time with friends and family. Minimize exposure to UV radiation to reduce your risk of skin cancer. Safety Always wear your seat belt while driving or riding in a vehicle. Do not drive: If you have been drinking alcohol. Do not ride with someone who has been drinking. When you are tired or distracted. While texting. If you have been using any mind-altering substances or drugs. Wear a helmet and other protective equipment during sports activities. If you have firearms in your house, make sure you follow all gun safety procedures. What's next? Visit your health care provider once a year for an annual wellness visit. Ask your health care provider how often you should have your eyes and teeth checked. Stay up to date on all vaccines. This information is not intended to replace advice given to you by your health care provider. Make sure you discuss any questions you have with your health care provider. Document Revised: 07/14/2020 Document Reviewed: 07/14/2020 Elsevier Patient Education  2024 [employer].

## 2023-09-28 LAB — LIPID PANEL
Cholesterol: 185 mg/dL (ref 0–200)
HDL: 78.9 mg/dL (ref >39.00)
LDL Cholesterol: 73 mg/dL (ref 0–99)
NonHDL: 106.11
Total CHOL/HDL Ratio: 2
Triglycerides: 164 mg/dL — ABNORMAL HIGH (ref 0.0–149.0)
VLDL: 32.8 mg/dL (ref 0.0–40.0)

## 2023-09-28 LAB — COMPREHENSIVE METABOLIC PANEL WITH GFR
ALT: 21 U/L (ref 0–35)
AST: 23 U/L (ref 0–37)
Albumin: 4.5 g/dL (ref 3.5–5.2)
Alkaline Phosphatase: 61 U/L (ref 39–117)
BUN: 8 mg/dL (ref 6–23)
CO2: 29 meq/L (ref 19–32)
Calcium: 10.2 mg/dL (ref 8.4–10.5)
Chloride: 100 meq/L (ref 96–112)
Creatinine, Ser: 0.86 mg/dL (ref 0.40–1.20)
GFR: 65.6 mL/min (ref >60.00)
Glucose, Bld: 102 mg/dL — ABNORMAL HIGH (ref 70–99)
Potassium: 4.9 meq/L (ref 3.5–5.1)
Sodium: 138 meq/L (ref 135–145)
Total Bilirubin: 0.7 mg/dL (ref 0.2–1.2)
Total Protein: 7 g/dL (ref 6.0–8.3)

## 2023-09-28 LAB — CBC WITH DIFFERENTIAL/PLATELET
Basophils Absolute: 0.1 K/uL (ref 0.0–0.1)
Basophils Relative: 0.8 % (ref 0.0–3.0)
Eosinophils Absolute: 0.1 K/uL (ref 0.0–0.7)
Eosinophils Relative: 1.3 % (ref 0.0–5.0)
HCT: 40.2 % (ref 36.0–46.0)
Hemoglobin: 13.1 g/dL (ref 12.0–15.0)
Lymphocytes Relative: 26.1 % (ref 12.0–46.0)
Lymphs Abs: 2.1 K/uL (ref 0.7–4.0)
MCHC: 32.6 g/dL (ref 30.0–36.0)
MCV: 93 fl (ref 78.0–100.0)
Monocytes Absolute: 0.6 K/uL (ref 0.1–1.0)
Monocytes Relative: 7.9 % (ref 3.0–12.0)
Neutro Abs: 5.1 K/uL (ref 1.4–7.7)
Neutrophils Relative %: 63.9 % (ref 43.0–77.0)
Platelets: 358 K/uL (ref 150.0–400.0)
RBC: 4.32 Mil/uL (ref 3.87–5.11)
RDW: 13.1 % (ref 11.5–15.5)
WBC: 7.9 K/uL (ref 4.0–10.5)

## 2023-09-28 LAB — VITAMIN D 25 HYDROXY (VIT D DEFICIENCY, FRACTURES): VITD: 33.37 ng/mL (ref 30.00–100.00)

## 2023-09-28 LAB — TSH: TSH: 1.21 u[IU]/mL (ref 0.35–5.50)

## 2023-09-28 LAB — HEMOGLOBIN A1C: Hgb A1c MFr Bld: 6.1 % (ref 4.6–6.5)

## 2023-09-30 ENCOUNTER — Ambulatory Visit: Payer: Self-pay | Admitting: Family Medicine

## 2023-10-02 NOTE — Progress Notes (Signed)
 Patient reviewed via MyChart.

## 2023-10-11 ENCOUNTER — Encounter: Payer: Self-pay | Admitting: Pharmacist

## 2023-10-11 ENCOUNTER — Other Ambulatory Visit: Payer: Self-pay | Admitting: Pharmacist

## 2023-10-11 MED ORDER — ROSUVASTATIN CALCIUM 20 MG PO TABS: 20.0000 mg | ORAL_TABLET | Freq: Every day | ORAL | 1 refills | Status: AC

## 2023-10-11 MED ORDER — LOSARTAN POTASSIUM 50 MG PO TABS: 50.0000 mg | ORAL_TABLET | Freq: Every day | ORAL | 1 refills | Status: AC

## 2023-10-11 NOTE — Addendum Note (Signed)
 Addended by: CARLA MILLING B on: 10/11/2023 03:43 PM   Modules accepted: Orders

## 2023-10-11 NOTE — Progress Notes (Signed)
 Pharmacy Quality Measure Review  This patient is appearing on a report for failing the adherence measure for cholesterol (statin) medications in 2024. Following adherence for 2025 for both rosuvastatin  and losartan   Medication: rosuvastatin   Last fill date: 06/11/2023 for 90 day supply Also filled 03/04/2023 for 90 days.   Medication: losartan   Last fill date: 09/05/2023 for 90 day supply. Also filled for 90 day supply on 05/25/2023 and 02/25/2023 She has no refills remaining on rosuvastatin  or losartan .  Last visit with PCP was 09/27/2023; next appointment not yet scheduled.   Spoke with patient. She did need refill for rosuvastatin .  Sent in updated Rx for both rosuvastatin  and losartan  for 90 days and 1 refill.    Madelin Ray, PharmD Clinical Pharmacist [facility]  Population Health [phone]

## 2023-10-12 ENCOUNTER — Ambulatory Visit (INDEPENDENT_AMBULATORY_CARE_PROVIDER_SITE_OTHER): Admitting: *Deleted

## 2023-10-12 VITALS — Ht 59.0 in | Wt 121.0 lb

## 2023-10-12 DIAGNOSIS — Z0000 Encounter for general adult medical examination without abnormal findings: Secondary | ICD-10-CM

## 2023-10-12 DIAGNOSIS — Z122 Encounter for screening for malignant neoplasm of respiratory organs: Secondary | ICD-10-CM | POA: Diagnosis not present

## 2023-10-12 DIAGNOSIS — Z1231 Encounter for screening mammogram for malignant neoplasm of breast: Secondary | ICD-10-CM

## 2023-10-12 DIAGNOSIS — M810 Age-related osteoporosis without current pathological fracture: Secondary | ICD-10-CM

## 2023-10-12 NOTE — Patient Instructions (Addendum)
 Robin [name] , Thank you for taking time out of your busy schedule to complete your Annual Wellness Visit with me. I enjoyed our conversation and look forward to speaking with you again next year. I, as well as your care team,  appreciate your ongoing commitment to your health goals. Please review the following plan we discussed and let me know if I can assist you in the future. Your Game plan/ To Do List   Referrals: If you haven't heard from the office you've been referred to, please reach out to them at the phone provided.  Mammogram / Bone Density ([facility]): [phone]  Lung Cancer Screening (pulmonology): [phone]  Follow up Visits: Next Medicare AWV with our clinical staff: 10/16/24 3pm, telephone    Next Office Visit with your provider:  03/26/24 11am, Harlene Jolly, NP. (Can do Hep C screening blood test at this visit)  Clinician Recommendations:  Aim for 30 minutes of exercise or brisk walking, 6-8 glasses of water, and 5 servings of fruits and vegetables each day.   You will need to get the following vaccines at your local pharmacy: Flu, tetanus      This is a list of the screening recommended for you and due dates:  Health Maintenance  Topic Date Due   Hepatitis C Screening  Never done   DTaP/Tdap/Td vaccine (1 - Tdap) Never done   Screening for Lung Cancer  Never done   Medicare Annual Wellness Visit  07/19/2022   Flu Shot  08/31/2023   COVID-19 Vaccine (6 - 2025-26 season) 10/01/2023   Colon Cancer Screening  06/19/2027   Pneumococcal Vaccine for age over 18  Completed   DEXA scan (bone density measurement)  Completed   Zoster (Shingles) Vaccine  Completed   HPV Vaccine  Aged Out   Meningitis B Vaccine  Aged Out   Breast Cancer Screening  Discontinued   Cologuard (Stool DNA test)  Discontinued    **Your last 2 lab results have been mailed to you. Let us  know if you don't receive them within 1 week.**  Advanced directives: (Copy Requested) Please  bring a copy of your health care power of attorney and living will to the office to be added to your chart at your convenience. You can mail to [facility] [address] [place], [place] [address] or email to [email or web address] Advance Care Planning is important because it:  [x]  Makes sure you receive the medical care that is consistent with your values, goals, and preferences  [x]  It provides guidance to your family and loved ones and reduces their decisional burden about whether or not they are making the right decisions based on your wishes.  Follow the link provided in your after visit summary or read over the paperwork we have mailed to you to help you started getting your Advance Directives in place. If you need assistance in completing these, please reach out to us  so that we can help you!  See attachments for Preventive Care and Fall Prevention Tips.

## 2023-10-12 NOTE — Progress Notes (Signed)
 Please attest this visit in the absence of patient primary care provider.    Subjective:   Robin [name] is a 78 y.o. who presents for a Medicare Wellness preventive visit.  As a reminder, Annual Wellness Visits don't include a physical exam, and some assessments may be limited, especially if this visit is performed virtually. We may recommend an in-person follow-up visit with your provider if needed.  Visit Complete: Virtual I connected with  Robin [name] on 10/12/23 by a audio enabled telemedicine application and verified that I am speaking with the correct person using two identifiers.  Patient Location: Home  Provider Location: Office/Clinic  I discussed the limitations of evaluation and management by telemedicine. The patient expressed understanding and agreed to proceed.  Vital Signs: Because this visit was a virtual/telehealth visit, some criteria may be missing or patient reported. Any vitals not documented were not able to be obtained and vitals that have been documented are patient reported.  VideoDeclined- This patient declined Librarian, academic. Therefore the visit was completed with audio only.  Persons Participating in Visit: Patient.  AWV Questionnaire: Yes: Patient Medicare AWV questionnaire was completed by the patient on 10/05/23; I have confirmed that all information answered by patient is correct and no changes since this date.  Cardiac Risk Factors include: advanced age (>1men, >67 women);hypertension;dyslipidemia     Objective:    Today's Vitals   10/12/23 1457  Weight: 121 lb (54.9 kg)  Height: 4' 11 (1.499 m)   Body mass index is 24.44 kg/m.     10/12/2023    3:10 PM 07/18/2021   10:23 AM 05/08/2016   10:25 AM 06/20/2014   10:15 PM  Advanced Directives  Does Patient Have a Medical Advance Directive? Yes Yes Yes  No   Type of Estate agent of [place];Living will Healthcare Power of [place];Out of  facility DNR (pink MOST or yellow form);Living will Healthcare Power of [place];Living will   Does patient want to make changes to medical advance directive? No - Patient declined  No - Patient declined    Copy of Healthcare Power of Attorney in Chart? No - copy requested No - copy requested No - copy requested    Would patient like information on creating a medical advance directive?    Yes - Educational materials given      Data saved with a previous flowsheet row definition    Current Medications (verified) Outpatient Encounter Medications as of 10/12/2023  Medication Sig   Bacillus Coagulans-Inulin (ALIGN PREBIOTIC-PROBIOTIC PO) Take by mouth.   calcium -vitamin D  (OSCAL WITH D) 500-200 MG-UNIT per tablet Take 1 tablet by mouth.   carvedilol  (COREG ) 3.125 MG tablet Take 1 tablet (3.125 mg total) by mouth 2 (two) times daily.   dicyclomine  (BENTYL ) 10 MG capsule TAKE 1 CAPSULE BY MOUTH EVERY 6 HOURS AS NEEDED FOR  SPASMS   famotidine (PEPCID) 40 MG tablet Take 40 mg by mouth daily.   losartan  (COZAAR ) 50 MG tablet Take 1 tablet (50 mg total) by mouth daily.   Multiple Vitamins-Minerals (MULTIVITAMIN WITH MINERALS) tablet Take 1 tablet by mouth daily.   Probiotic Product (ALIGN EXTRA STRENGTH PO) Take by mouth.   rosuvastatin  (CRESTOR ) 20 MG tablet Take 1 tablet (20 mg total) by mouth daily.   valACYclovir  (VALTREX ) 500 MG tablet Take 1 tablet (500 mg total) by mouth 2 (two) times daily as needed.   No facility-administered encounter medications on file as of 10/12/2023.  Allergies (verified) Pravastatin , Monistat [miconazole], and Codeine   History: 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   Grief reaction 06/04/2014   Hyperlipidemia    Hypertension    Inverted nipple    Bilateral   Medicare annual wellness visit,  subsequent 06/20/2014   Medicare annual wellness visit, subsequent 05/06/2015   Osteopenia    Preventative health care 06/20/2014   Medication: Review, verify sig & reconcile(including outside meds): Duplicates discarded: DM supply source:  Preferred Pharmacy and which med where:[facility] - [place], [place] - [address] 90 day supply/mail order: 90 Local pharmacy: [facility] [place] - [address]  Allergies verified:UTD  Immunization Status: Prompt   Rosacea 11/06/2016   Rotator cuff tear, right 11/26/2012   Past Surgical History:  Procedure Laterality Date   CATARACT EXTRACTION Right 12/2016   EYE SURGERY Left 07/08/13   OOPHORECTOMY Right    SHOULDER ARTHROSCOPY     Family History  Problem Relation Age of Onset   COPD Mother        smoker   Hypertension Mother    Colon cancer Father 77       colon, preCA?   Arthritis Father        rheumatoid arthritis   Heart disease Father        stents   Arthritis Sister    Urolithiasis Sister    Stroke Maternal Grandmother    Hypertension Maternal Grandmother    Heart disease Paternal Grandfather    Heart disease Son    Stomach cancer Neg Hx    Esophageal cancer Neg Hx    Social History   Socioeconomic History   Marital status: Widowed    Spouse name: Not on file   Number of children: 2   Years of education: Not on file   Highest education level: Associate degree: occupational, Scientist, product/process development, or vocational program  Occupational History   Occupation: retired  Tobacco Use   Smoking status: Former    Average packs/day: 0.5 packs/day for 31.1 years (15.5 ttl pk-yrs)    Types: Cigarettes    Start date: 01/03/1983   Smokeless tobacco: Never   Tobacco comments:    06-02-13  still smoking  Vaping Use   Vaping status: Never Used  Substance and Sexual Activity   Alcohol use: Yes    Alcohol/week: 21.0 standard drinks of alcohol    Types: 21 Glasses of wine per  week    Comment: 2-3 glasses of a day   Drug use: No   Sexual activity: Never    Comment: no dietary restrictions, smokes, lives with husband   Other Topics Concern   Not on file  Social History Narrative   Not on file   Social Drivers of Health   Financial Resource Strain: Low Risk  (10/05/2023)   Overall Financial Resource Strain (CARDIA)    Difficulty of Paying Living Expenses: Not hard at all  Food Insecurity: No Food Insecurity (10/05/2023)   Hunger Vital Sign    Worried About Running Out of Food in the Last Year: Never true    Ran Out of Food in the Last Year: Never true  Transportation Needs: No Transportation Needs (10/05/2023)   PRAPARE - Administrator, Civil Service (Medical): No  Lack of Transportation (Non-Medical): No  Physical Activity: Sufficiently Active (10/05/2023)   Exercise Vital Sign    Days of Exercise per Week: 5 days    Minutes of Exercise per Session: 40 min  Stress: No Stress Concern Present (10/05/2023)   [employer] of Occupational Health - Occupational Stress Questionnaire    Feeling of Stress: Only a little  Social Connections: Moderately Integrated (10/05/2023)   Social Connection and Isolation Panel    Frequency of Communication with Friends and Family: More than three times a week    Frequency of Social Gatherings with Friends and Family: Twice a week    Attends Religious Services: More than 4 times per year    Active Member of [employer] or Organizations: Yes    Attends Banker Meetings: More than 4 times per year    Marital Status: Widowed    Tobacco Counseling Counseling given: Not Answered Tobacco comments: 06-02-13  still smoking    Clinical Intake:  Pre-visit preparation completed: Yes  Pain : No/denies pain     BMI - recorded: 24.44 Nutritional Status: BMI of 19-24  Normal Nutritional Risks: None Diabetes: No  Lab Results  Component Value Date   HGBA1C 6.1 09/27/2023   HGBA1C 5.8 09/25/2022   HGBA1C  6.1 09/15/2021     How often do you need to have someone help you when you read instructions, pamphlets, or other written materials from your doctor or pharmacy?: 1 - Never What is the last grade level you completed in school?: associate's degree  Interpreter Needed?: No  Information entered by :: Lolita Libra, CMA(AAMA)   Activities of Daily Living     10/05/2023    9:08 AM  In your present state of health, do you have any difficulty performing the following activities:  Hearing? 1  Comment has hearing aids  Vision? 0  Difficulty concentrating or making decisions? 1  Comment Thinks memory slips are just part of aging. Not new and not concerning for pt. pulls a blank every now and then  Walking or climbing stairs? 0  Dressing or bathing? 0  Doing errands, shopping? 0  Preparing Food and eating ? N  Using the Toilet? N  In the past six months, have you accidently leaked urine? N  Do you have problems with loss of bowel control? 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 N  Managing your Finances? N  Housekeeping or managing your Housekeeping? N    Patient Care Team: Domenica Harlene LABOR, MD as PCP - General (Family Medicine) Celestia Agent, MD (Inactive) as Consulting Physician (Gastroenterology) Harrie Stark, Deanna M, PA-C as Physician Assistant (Emergency Medicine) Lucie Stair (Optometry)  I have updated your Care Teams any recent Medical Services you may have received from other providers in the past year.     Assessment:   This is a routine wellness examination for Robin [name].  Hearing/Vision screen No results found.   Goals Addressed   None    Depression Screen     09/27/2023    1:33 PM 05/28/2023   11:09 AM 09/25/2022    1:26 PM 09/22/2021   10:05 AM 07/18/2021   10:26 AM 02/28/2021   10:52 AM 08/24/2020   10:38 AM  PHQ 2/9 Scores  PHQ - 2 Score 0 0 0 0 0 0 0  PHQ- 9 Score 2  0 0     Pt reports no change since 09/27/23.   Fall Risk      10/05/2023  9:08 AM 09/27/2023    1:32 PM 05/28/2023   11:09 AM 09/25/2022    1:25 PM 09/25/2022    1:24 PM  Fall Risk   Falls in the past year? 0 0 0 0 0  Number falls in past yr:  0 0 0 0  Injury with Fall?  0 0 0 0  Risk for fall due to :  No Fall Risks     Follow up  Falls evaluation completed Education provided;Falls evaluation completed Falls evaluation completed Falls evaluation completed    MEDICARE RISK AT HOME:  Medicare Risk at Home Any stairs in or around the home?: (Patient-Rptd) Yes If so, are there any without handrails?: (Patient-Rptd) No Home free of loose throw rugs in walkways, pet beds, electrical cords, etc?: (Patient-Rptd) No Adequate lighting in your home to reduce risk of falls?: (Patient-Rptd) Yes Life alert?: (Patient-Rptd) No Use of a cane, walker or w/c?: (Patient-Rptd) No Grab bars in the bathroom?: (Patient-Rptd) No Shower chair or bench in shower?: (Patient-Rptd) Yes Elevated toilet seat or a handicapped toilet?: (Patient-Rptd) Yes  TIMED UP AND GO:  Was the test performed?  No,audio  Cognitive Function: 6CIT completed        07/18/2021   10:34 AM  6CIT Screen  What Year? 0 points  What month? 0 points  What time? 0 points  Count back from 20 0 points  Months in reverse 0 points  Repeat phrase 0 points  Total Score 0 points    Immunizations Immunization History  Administered Date(s) Administered   Fluad  Quad(high Dose 65+) 09/26/2018, 11/17/2020, 10/28/2021   Fluad  Trivalent(High Dose 65+) 11/30/2022   INFLUENZA, HIGH DOSE SEASONAL PF 11/06/2016, 11/08/2017, 10/31/2019   Influenza,inj,Quad PF,6+ Mos 11/26/2012, 12/01/2013, 11/05/2014, 12/09/2014   Influenza-Unspecified 10/21/2015   Moderna Covid-19 Vaccine Bivalent Booster 43yrs & up 12/01/2020   Moderna Sars-Covid-2 Vaccination 03/14/2019, 04/11/2019, 12/09/2019   Pfizer(Comirnaty )Fall Seasonal Vaccine 12 years and older 11/28/2021   Pneumococcal Conjugate-13 11/06/2016    Pneumococcal Polysaccharide-23 01/06/2013   Zoster Recombinant(Shingrix ) 04/29/2021, 07/19/2021    Screening Tests Health Maintenance  Topic Date Due   Hepatitis C Screening  Never done   DTaP/Tdap/Td (1 - Tdap) Never done   Influenza Vaccine  08/31/2023   COVID-19 Vaccine (6 - 2025-26 season) 10/11/2024 (Originally 10/01/2023)   Medicare Annual Wellness (AWV)  10/11/2024   Colonoscopy  06/19/2027   Pneumococcal Vaccine: 50+ Years  Completed   DEXA SCAN  Completed   Zoster Vaccines- Shingrix   Completed   HPV VACCINES  Aged Out   Meningococcal B Vaccine  Aged Out   Mammogram  Discontinued   Fecal DNA (Cologuard)  Discontinued    Health Maintenance Items Addressed: Mammogram, dexa, lung cancer screening ordered today. Will get flu and tetanus vaccines at pharmacy. Will do Hep C screening at next OV.  Additional Screening:  Vision Screening: Recommended annual ophthalmology exams for early detection of glaucoma and other disorders of the eye. Is the patient up to date with their annual eye exam?  Yes  Who is the provider or what is the name of the office in which the patient attends annual eye exams? [facility]  Dental Screening: Recommended annual dental exams for proper oral hygiene  Community Resource Referral / Chronic Care Management: CRR required this visit?  No   CCM required this visit?  No   Plan:    I have personally reviewed and noted the following in the patient's chart:   Medical and social history Use  of alcohol, tobacco or illicit drugs  Current medications and supplements including opioid prescriptions. Patient is not currently taking opioid prescriptions. Functional ability and status Nutritional status Physical activity Advanced directives List of other physicians Hospitalizations, surgeries, and ER visits in previous 12 months Vitals Screenings to include cognitive, depression, and falls Referrals and appointments  In addition, I have reviewed and  discussed with patient certain preventive protocols, quality metrics, and best practice recommendations. A written personalized care plan for preventive services as well as general preventive health recommendations were provided to patient.   Lolita Libra, CMA   10/12/2023   After Visit Summary: (MyChart) Due to this being a telephonic visit, the after visit summary with patients personalized plan was offered to patient via MyChart   Notes: Nothing significant to report at this time.

## 2023-10-17 NOTE — Progress Notes (Addendum)
 Please attest this visit in the absence of patient primary care provider.    Subjective:   Robin [name] is a 78 y.o. who presents for a Medicare Wellness preventive visit.  As a reminder, Annual Wellness Visits don't include a physical exam, and some assessments may be limited, especially if this visit is performed virtually. We may recommend an in-person follow-up visit with your provider if needed.  Visit Complete: Virtual I connected with  Robin [name] on 10/17/23 by a audio enabled telemedicine application and verified that I am speaking with the correct person using two identifiers.  Patient Location: Home  Provider Location: Office/Clinic  I discussed the limitations of evaluation and management by telemedicine. The patient expressed understanding and agreed to proceed.  Vital Signs: Because this visit was a virtual/telehealth visit, some criteria may be missing or patient reported. Any vitals not documented were not able to be obtained and vitals that have been documented are patient reported.  VideoDeclined- This patient declined Librarian, academic. Therefore the visit was completed with audio only.  Persons Participating in Visit: Patient.  AWV Questionnaire: Yes: Patient Medicare AWV questionnaire was completed by the patient on 10/05/23; I have confirmed that all information answered by patient is correct and no changes since this date.  Cardiac Risk Factors include: advanced age (>21men, >79 women);hypertension;dyslipidemia     Objective:    Today's Vitals   10/12/23 1457  Weight: 121 lb (54.9 kg)  Height: 4' 11 (1.499 m)   Body mass index is 24.44 kg/m.     10/12/2023    3:10 PM 07/18/2021   10:23 AM 05/08/2016   10:25 AM 06/20/2014   10:15 PM  Advanced Directives  Does Patient Have a Medical Advance Directive? Yes Yes Yes  No   Type of Estate agent of [place];Living will Healthcare Power of [place];Out of  facility DNR (pink MOST or yellow form);Living will Healthcare Power of [place];Living will   Does patient want to make changes to medical advance directive? No - Patient declined  No - Patient declined    Copy of Healthcare Power of Attorney in Chart? No - copy requested No - copy requested No - copy requested    Would patient like information on creating a medical advance directive?    Yes - Educational materials given      Data saved with a previous flowsheet row definition    Current Medications (verified) Outpatient Encounter Medications as of 10/12/2023  Medication Sig   Bacillus Coagulans-Inulin (ALIGN PREBIOTIC-PROBIOTIC PO) Take by mouth.   calcium -vitamin D  (OSCAL WITH D) 500-200 MG-UNIT per tablet Take 1 tablet by mouth.   carvedilol  (COREG ) 3.125 MG tablet Take 1 tablet (3.125 mg total) by mouth 2 (two) times daily.   dicyclomine  (BENTYL ) 10 MG capsule TAKE 1 CAPSULE BY MOUTH EVERY 6 HOURS AS NEEDED FOR  SPASMS   famotidine (PEPCID) 40 MG tablet Take 40 mg by mouth daily.   losartan  (COZAAR ) 50 MG tablet Take 1 tablet (50 mg total) by mouth daily.   Multiple Vitamins-Minerals (MULTIVITAMIN WITH MINERALS) tablet Take 1 tablet by mouth daily.   Probiotic Product (ALIGN EXTRA STRENGTH PO) Take by mouth.   rosuvastatin  (CRESTOR ) 20 MG tablet Take 1 tablet (20 mg total) by mouth daily.   valACYclovir  (VALTREX ) 500 MG tablet Take 1 tablet (500 mg total) by mouth 2 (two) times daily as needed.   No facility-administered encounter medications on file as of 10/12/2023.  Allergies (verified) Pravastatin , Monistat [miconazole], and Codeine   History: 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   Grief reaction 06/04/2014   Hyperlipidemia    Hypertension    Inverted nipple    Bilateral   Medicare annual wellness visit,  subsequent 06/20/2014   Medicare annual wellness visit, subsequent 05/06/2015   Osteopenia    Preventative health care 06/20/2014   Medication: Review, verify sig & reconcile(including outside meds): Duplicates discarded: DM supply source:  Preferred Pharmacy and which med where:[facility] - [place], [place] - [address] 90 day supply/mail order: 90 Local pharmacy: [facility] [place], [place] - [address]  Allergies verified:UTD  Immunization Status: Prompt   Rosacea 11/06/2016   Rotator cuff tear, right 11/26/2012   Past Surgical History:  Procedure Laterality Date   CATARACT EXTRACTION Right 12/2016   EYE SURGERY Left 07/08/13   OOPHORECTOMY Right    SHOULDER ARTHROSCOPY     Family History  Problem Relation Age of Onset   COPD Mother        smoker   Hypertension Mother    Colon cancer Father 64       colon, preCA?   Arthritis Father        rheumatoid arthritis   Heart disease Father        stents   Arthritis Sister    Urolithiasis Sister    Stroke Maternal Grandmother    Hypertension Maternal Grandmother    Heart disease Paternal Grandfather    Heart disease Son    Stomach cancer Neg Hx    Esophageal cancer Neg Hx    Social History   Socioeconomic History   Marital status: Widowed    Spouse name: Not on file   Number of children: 2   Years of education: Not on file   Highest education level: Associate degree: occupational, Scientist, product/process development, or vocational program  Occupational History   Occupation: retired  Tobacco Use   Smoking status: Former    Average packs/day: 0.5 packs/day for 31.1 years (15.5 ttl pk-yrs)    Types: Cigarettes    Start date: 01/03/1983   Smokeless tobacco: Never   Tobacco comments:    06-02-13  still smoking  Vaping Use   Vaping status: Never Used  Substance and Sexual Activity   Alcohol use: Yes    Alcohol/week: 21.0 standard drinks of alcohol    Types: 21 Glasses of wine per  week    Comment: 2-3 glasses of a day   Drug use: No   Sexual activity: Never    Comment: no dietary restrictions, smokes, lives with husband   Other Topics Concern   Not on file  Social History Narrative   Not on file   Social Drivers of Health   Financial Resource Strain: Low Risk  (10/05/2023)   Overall Financial Resource Strain (CARDIA)    Difficulty of Paying Living Expenses: Not hard at all  Food Insecurity: No Food Insecurity (10/05/2023)   Hunger Vital Sign    Worried About Running Out of Food in the Last Year: Never true    Ran Out of Food in the Last Year: Never true  Transportation Needs: No Transportation Needs (10/05/2023)   PRAPARE - Administrator, Civil Service (Medical): No  Lack of Transportation (Non-Medical): No  Physical Activity: Sufficiently Active (10/05/2023)   Exercise Vital Sign    Days of Exercise per Week: 5 days    Minutes of Exercise per Session: 40 min  Stress: No Stress Concern Present (10/05/2023)   [employer] of Occupational Health - Occupational Stress Questionnaire    Feeling of Stress: Only a little  Social Connections: Moderately Integrated (10/05/2023)   Social Connection and Isolation Panel    Frequency of Communication with Friends and Family: More than three times a week    Frequency of Social Gatherings with Friends and Family: Twice a week    Attends Religious Services: More than 4 times per year    Active Member of [employer] or Organizations: Yes    Attends Banker Meetings: More than 4 times per year    Marital Status: Widowed    Tobacco Counseling Counseling given: Not Answered Tobacco comments: 06-02-13  still smoking    Clinical Intake:  Pre-visit preparation completed: Yes  Pain : No/denies pain     BMI - recorded: 24.44 Nutritional Status: BMI of 19-24  Normal Nutritional Risks: None Diabetes: No  Lab Results  Component Value Date   HGBA1C 6.1 09/27/2023   HGBA1C 5.8 09/25/2022   HGBA1C  6.1 09/15/2021     How often do you need to have someone help you when you read instructions, pamphlets, or other written materials from your doctor or pharmacy?: 1 - Never What is the last grade level you completed in school?: associate's degree  Interpreter Needed?: No  Information entered by :: Lolita Libra, CMA(AAMA)   Activities of Daily Living     10/05/2023    9:08 AM  In your present state of health, do you have any difficulty performing the following activities:  Hearing? 1  Comment has hearing aids  Vision? 0  Difficulty concentrating or making decisions? 1  Comment Thinks memory slips are just part of aging. Not new and not concerning for pt. pulls a blank every now and then  Walking or climbing stairs? 0  Dressing or bathing? 0  Doing errands, shopping? 0  Preparing Food and eating ? N  Using the Toilet? N  In the past six months, have you accidently leaked urine? N  Do you have problems with loss of bowel control? 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 N  Managing your Finances? N  Housekeeping or managing your Housekeeping? N    Patient Care Team: Domenica Harlene LABOR, MD as PCP - General (Family Medicine) Celestia Agent, MD (Inactive) as Consulting Physician (Gastroenterology) Harrie Stark, Deanna M, PA-C as Physician Assistant (Emergency Medicine) Lucie Stair (Optometry)  I have updated your Care Teams any recent Medical Services you may have received from other providers in the past year.     Assessment:   This is a routine wellness examination for Robin [name].  Hearing/Vision screen No results found.   Goals Addressed   None    Depression Screen     09/27/2023    1:33 PM 05/28/2023   11:09 AM 09/25/2022    1:26 PM 09/22/2021   10:05 AM 07/18/2021   10:26 AM 02/28/2021   10:52 AM 08/24/2020   10:38 AM  PHQ 2/9 Scores  PHQ - 2 Score 0 0 0 0 0 0 0  PHQ- 9 Score 2  0 0     Pt reports no change since 09/27/23.   Fall Risk      10/05/2023  9:08 AM 09/27/2023    1:32 PM 05/28/2023   11:09 AM 09/25/2022    1:25 PM 09/25/2022    1:24 PM  Fall Risk   Falls in the past year? 0 0 0 0 0  Number falls in past yr:  0 0 0 0  Injury with Fall?  0 0 0 0  Risk for fall due to :  No Fall Risks     Follow up  Falls evaluation completed Education provided;Falls evaluation completed Falls evaluation completed Falls evaluation completed    MEDICARE RISK AT HOME:  Medicare Risk at Home Any stairs in or around the home?: (Patient-Rptd) Yes If so, are there any without handrails?: (Patient-Rptd) No Home free of loose throw rugs in walkways, pet beds, electrical cords, etc?: (Patient-Rptd) No Adequate lighting in your home to reduce risk of falls?: (Patient-Rptd) Yes Life alert?: (Patient-Rptd) No Use of a cane, walker or w/c?: (Patient-Rptd) No Grab bars in the bathroom?: (Patient-Rptd) No Shower chair or bench in shower?: (Patient-Rptd) Yes Elevated toilet seat or a handicapped toilet?: (Patient-Rptd) Yes  TIMED UP AND GO:  Was the test performed?  No,audio  Cognitive Function: 6CIT completed        10/12/2023    3:10 PM 07/18/2021   10:34 AM  6CIT Screen  What Year? 0 points 0 points  What month? 0 points 0 points  What time? 0 points 0 points  Count back from 20 0 points 0 points  Months in reverse 0 points 0 points  Repeat phrase 0 points 0 points  Total Score 0 points 0 points  Screening was performed at [facility].  Immunizations Immunization History  Administered Date(s) Administered   Fluad  Quad(high Dose 65+) 09/26/2018, 11/17/2020, 10/28/2021   Fluad  Trivalent(High Dose 65+) 11/30/2022   INFLUENZA, HIGH DOSE SEASONAL PF 11/06/2016, 11/08/2017, 10/31/2019   Influenza,inj,Quad PF,6+ Mos 11/26/2012, 12/01/2013, 11/05/2014, 12/09/2014   Influenza-Unspecified 10/21/2015   Moderna Covid-19 Vaccine Bivalent Booster 75yrs & up 12/01/2020   Moderna Sars-Covid-2 Vaccination 03/14/2019, 04/11/2019, 12/09/2019    Pfizer(Comirnaty )Fall Seasonal Vaccine 12 years and older 11/28/2021   Pneumococcal Conjugate-13 11/06/2016   Pneumococcal Polysaccharide-23 01/06/2013   Zoster Recombinant(Shingrix ) 04/29/2021, 07/19/2021    Screening Tests Health Maintenance  Topic Date Due   Hepatitis C Screening  Never done   DTaP/Tdap/Td (1 - Tdap) Never done   Influenza Vaccine  08/31/2023   COVID-19 Vaccine (6 - 2025-26 season) 10/11/2024 (Originally 10/01/2023)   Medicare Annual Wellness (AWV)  10/11/2024   Colonoscopy  06/19/2027   Pneumococcal Vaccine: 50+ Years  Completed   DEXA SCAN  Completed   Zoster Vaccines- Shingrix   Completed   HPV VACCINES  Aged Out   Meningococcal B Vaccine  Aged Out   Mammogram  Discontinued   Fecal DNA (Cologuard)  Discontinued    Health Maintenance Items Addressed: Mammogram, dexa, lung cancer screening ordered today. Will get flu and tetanus vaccines at pharmacy. Will do Hep C screening at next OV.  Additional Screening:  Vision Screening: Recommended annual ophthalmology exams for early detection of glaucoma and other disorders of the eye. Is the patient up to date with their annual eye exam?  Yes  Who is the provider or what is the name of the office in which the patient attends annual eye exams? [facility]  Dental Screening: Recommended annual dental exams for proper oral hygiene  Community Resource Referral / Chronic Care Management: CRR required this visit?  No   CCM required this visit?  No  Plan:    I have personally reviewed and noted the following in the patient's chart:   Medical and social history Use of alcohol, tobacco or illicit drugs  Current medications and supplements including opioid prescriptions. Patient is not currently taking opioid prescriptions. Functional ability and status Nutritional status Physical activity Advanced directives List of other physicians Hospitalizations, surgeries, and ER visits in previous 12  months Vitals Screenings to include cognitive, depression, and falls Referrals and appointments  In addition, I have reviewed and discussed with patient certain preventive protocols, quality metrics, and best practice recommendations. A written personalized care plan for preventive services as well as general preventive health recommendations were provided to patient.   Lolita Libra, CMA   10/17/2023   After Visit Summary: (MyChart) Due to this being a telephonic visit, the after visit summary with patients personalized plan was offered to patient via MyChart   Notes: Nothing significant to report at this time.

## 2023-11-26 ENCOUNTER — Other Ambulatory Visit (HOSPITAL_BASED_OUTPATIENT_CLINIC_OR_DEPARTMENT_OTHER): Payer: Self-pay

## 2023-11-26 MED ORDER — FLUZONE HIGH-DOSE 0.5 ML IM SUSY
0.5000 mL | PREFILLED_SYRINGE | Freq: Once | INTRAMUSCULAR | 0 refills | Status: AC
  Filled 2023-11-26: qty 0.5, 1d supply, fill #0

## 2023-12-19 ENCOUNTER — Encounter: Payer: Self-pay | Admitting: Pharmacist

## 2023-12-19 NOTE — Progress Notes (Signed)
 Pharmacy Quality Measure Review  This patient is appearing on a report for failing the adherence measure for cholesterol (statin) medications in 2024. Following adherence for 2025 for both rosuvastatin  and losartan   Medication: rosuvastatin   Last fill date: 10/12/2023 for 90 day supply Also filled 03/04/2023 and 06/14/2023 for 90 days.  She is currently at 86% adherence rate for 2025 based on refill history. Patient has 1 refill remaining for rosuvastatin .  Medication: losartan   Last fill date: 12/02/2023 for 90 days Also filled for 90 day supply on 09/05/2023; 05/25/2023 and 02/25/2023.  She is currently at 96% adherence rate for 2025 based on losartan  refill history.  Patient has 1 refill remaining for losartan .  Last visit with PCP was 09/27/2023 and next appointment is 03/26/2024  Madelin Ray, PharmD Clinical Pharmacist [facility]  Population Health [phone]

## 2023-12-24 ENCOUNTER — Encounter (HOSPITAL_BASED_OUTPATIENT_CLINIC_OR_DEPARTMENT_OTHER): Payer: Self-pay

## 2023-12-24 ENCOUNTER — Ambulatory Visit (HOSPITAL_BASED_OUTPATIENT_CLINIC_OR_DEPARTMENT_OTHER)
Admission: RE | Admit: 2023-12-24 | Discharge: 2023-12-24 | Disposition: A | Discharge status: Home / Self Care | Source: Ambulatory Visit | Attending: Family Medicine | Admitting: Family Medicine

## 2023-12-24 DIAGNOSIS — Z1231 Encounter for screening mammogram for malignant neoplasm of breast: Secondary | ICD-10-CM | POA: Insufficient documentation

## 2023-12-24 DIAGNOSIS — Z780 Asymptomatic menopausal state: Secondary | ICD-10-CM | POA: Diagnosis not present

## 2023-12-24 DIAGNOSIS — M810 Age-related osteoporosis without current pathological fracture: Secondary | ICD-10-CM | POA: Diagnosis not present

## 2023-12-30 ENCOUNTER — Ambulatory Visit: Payer: Self-pay | Admitting: Family Medicine

## 2024-03-26 ENCOUNTER — Ambulatory Visit: Admitting: Student

## 2024-10-16 ENCOUNTER — Ambulatory Visit
